# Patient Record
Sex: Male | Born: 1955 | Race: White | Hispanic: No | Smoking: Current every day smoker
Health system: Southern US, Community
[De-identification: ages and names within clinical notes are randomized; demographics above are authoritative.]

## PROBLEM LIST (undated history)

## (undated) DIAGNOSIS — Z8781 Personal history of (healed) traumatic fracture: Secondary | ICD-10-CM

## (undated) DIAGNOSIS — F909 Attention-deficit hyperactivity disorder, unspecified type: Secondary | ICD-10-CM

## (undated) DIAGNOSIS — S21119A Laceration without foreign body of unspecified front wall of thorax without penetration into thoracic cavity, initial encounter: Secondary | ICD-10-CM

## (undated) DIAGNOSIS — I498 Other specified cardiac arrhythmias: Secondary | ICD-10-CM

## (undated) HISTORY — DX: Personal history of (healed) traumatic fracture: Z87.81

## (undated) HISTORY — DX: Other specified cardiac arrhythmias: I49.8

## (undated) HISTORY — PX: OTHER SURGICAL HISTORY: SHX169

## (undated) HISTORY — PX: KNEE SURGERY: SHX244

## (undated) HISTORY — DX: Attention-deficit hyperactivity disorder, unspecified type: F90.9

## (undated) HISTORY — DX: Laceration without foreign body of unspecified front wall of thorax without penetration into thoracic cavity, initial encounter: S21.119A

---

## 2003-03-06 ENCOUNTER — Ambulatory Visit (HOSPITAL_COMMUNITY): Admission: RE | Admit: 2003-03-06 | Discharge: 2003-03-06 | Payer: Self-pay | Admitting: General Surgery

## 2003-03-06 ENCOUNTER — Encounter: Payer: Self-pay | Admitting: General Surgery

## 2005-05-28 ENCOUNTER — Inpatient Hospital Stay (HOSPITAL_COMMUNITY): Admission: AC | Admit: 2005-05-28 | Discharge: 2005-05-29 | Payer: Self-pay

## 2005-06-28 ENCOUNTER — Emergency Department (HOSPITAL_COMMUNITY): Admission: EM | Admit: 2005-06-28 | Discharge: 2005-06-28 | Payer: Self-pay | Admitting: *Deleted

## 2005-07-01 ENCOUNTER — Emergency Department (HOSPITAL_COMMUNITY): Admission: EM | Admit: 2005-07-01 | Discharge: 2005-07-01 | Payer: Self-pay | Admitting: Family Medicine

## 2005-07-06 ENCOUNTER — Emergency Department (HOSPITAL_COMMUNITY): Admission: EM | Admit: 2005-07-06 | Discharge: 2005-07-06 | Payer: Self-pay | Admitting: Family Medicine

## 2011-03-29 ENCOUNTER — Ambulatory Visit (INDEPENDENT_AMBULATORY_CARE_PROVIDER_SITE_OTHER): Payer: Self-pay

## 2011-03-29 ENCOUNTER — Inpatient Hospital Stay (INDEPENDENT_AMBULATORY_CARE_PROVIDER_SITE_OTHER)
Admission: RE | Admit: 2011-03-29 | Discharge: 2011-03-29 | Disposition: A | Discharge status: Home / Self Care | Payer: Self-pay | Source: Ambulatory Visit | Attending: Family Medicine | Admitting: Family Medicine

## 2011-03-29 DIAGNOSIS — J40 Bronchitis, not specified as acute or chronic: Secondary | ICD-10-CM

## 2011-04-06 ENCOUNTER — Telehealth: Payer: Self-pay | Admitting: *Deleted

## 2011-04-06 NOTE — Telephone Encounter (Signed)
Called spoke with patient who informed me that he had just scheduled an appt for 7.20.12 @ 1115.  Pt states that his breathing is doing well presently and not having any problems.  Advised pt that if symptoms return to call the office for a sooner appt.  Pt verbalized his understanding.  Pt to arrive early and bring all meds to the ov.

## 2011-04-06 NOTE — Telephone Encounter (Signed)
Per MW- this pt needs appt here in 2 wks for eval of cough- he was recently in the ED.  I have called and LM with his sister to return my call

## 2011-04-06 NOTE — Telephone Encounter (Signed)
PATIENT RETURNED LESLIE'S CALL.  PLEASE CALL BACK AT [phone].  HE IS IN CLASS AND WILL TRY TO ANSWER.

## 2011-05-06 ENCOUNTER — Ambulatory Visit (INDEPENDENT_AMBULATORY_CARE_PROVIDER_SITE_OTHER): Payer: Self-pay | Admitting: Internal Medicine

## 2011-05-06 ENCOUNTER — Encounter: Payer: Self-pay | Admitting: Internal Medicine

## 2011-05-06 VITALS — BP 142/70 | HR 75 | Temp 97.5°F | Ht 67.0 in | Wt 135.4 lb

## 2011-05-06 DIAGNOSIS — J310 Chronic rhinitis: Secondary | ICD-10-CM | POA: Insufficient documentation

## 2011-05-06 DIAGNOSIS — J449 Chronic obstructive pulmonary disease, unspecified: Secondary | ICD-10-CM

## 2011-05-06 DIAGNOSIS — F17200 Nicotine dependence, unspecified, uncomplicated: Secondary | ICD-10-CM | POA: Insufficient documentation

## 2011-05-06 MED ORDER — PREDNISONE (PAK) 10 MG PO TABS: ORAL_TABLET | ORAL | Status: AC

## 2011-05-06 MED ORDER — AZITHROMYCIN 250 MG PO TABS: ORAL_TABLET | ORAL | Status: AC

## 2011-05-06 MED ORDER — MOMETASONE FURO-FORMOTEROL FUM 200-5 MCG/ACT IN AERO: INHALATION_SPRAY | RESPIRATORY_TRACT | Status: DC

## 2011-05-06 NOTE — Assessment & Plan Note (Addendum)
Relatively mild but actively smoking.   DDX of  difficult airways managment all start with A and  include Adherence, Ace Inhibitors, Acid Reflux, Active Sinus Disease, Alpha 1 Antitripsin deficiency, Anxiety masquerading as Airways dz,  ABPA,  allergy(esp in young), Aspiration (esp in elderly), Adverse effects of DPI,  Active smokers, plus two Bs  = Bronchiectasis and Beta blocker use..and one C= CHF   Active smoking, see separate assessment  Adherence is always the initial "prime suspect" and is a multilayered concern that requires a "trust but verify" approach in every patient - starting with knowing how to use medications, especially inhalers, correctly, keeping up with refills and understanding the fundamental difference between maintenance and prns vs those medications only taken for a very short course and then stopped and not refilled.  The proper method of use, as well as anticipated side effects, of this metered-dose inhaler are discussed and demonstrated to the patient. Improved to 75% with coaching so try dulera 200 2bid  Active sinus dz?  Sinus ct next step

## 2011-05-06 NOTE — Progress Notes (Signed)
Subjective:     Patient ID: Jason Mcguire, male   DOB: 06-03-1956, 55 y.o.   MRN: [id number]  HPI  71 yowm active smoker h/o bronchitis previously under the care of Dr Viviann Spare with prn bronchaid as only longterm med.  05/06/2011 Initial pulmonary office eval since Sept 2011 cough and sob indolent onset / gradually worse s medical attention until 03/29/11 by [facility] rx advair one twice daily > no benefit  p several weeks of taking it regularly cc severe cough esp early am white mucus and when lie , som ebetter with bronchaid.  Doe x walk a mile.  Green mucus from nose x months assoc with nasal obstructive symptoms  Pt denies any significant sore throat, dysphagia, itching, sneezing,  fever, chills, sweats, unintended wt loss, pleuritic or exertional cp, hempoptysis, orthopnea pnd or leg swelling.    Also denies any obvious fluctuation of symptoms with weather or environmental changes or other aggravating or alleviating factors.     Previously seen [facility] on [facility] with dx sinus problems and better with z pak and prednisone   Review of Systems  Constitutional: Positive for activity change. Negative for fever, chills, appetite change and unexpected weight change.  HENT: Positive for congestion. Negative for sore throat, rhinorrhea, sneezing, trouble swallowing, dental problem, voice change and postnasal drip.   Eyes: Positive for visual disturbance.  Respiratory: Positive for cough and shortness of breath. Negative for choking.   Cardiovascular: Negative for chest pain and leg swelling.  Gastrointestinal: Negative for nausea, vomiting and abdominal pain.  Genitourinary: Negative for difficulty urinating.  Musculoskeletal: Negative for arthralgias.  Skin: Negative for rash.  Psychiatric/Behavioral: Negative for behavioral problems and confusion.       Objective:   Physical Exam Wt 135 05/06/2011  Patient failed to answer a single question asked in a straightforward manner, tending to  go off on tangents or answer questions with ambiguous medical terms or diagnoses and seemed aggravated  when asked the same question more than once for clarification.  HEENT mild turbinate edema.  Oropharynx no thrush or excess pnd or cobblestoning.  No JVD or cervical adenopathy. Mild accessory muscle hypertrophy. Trachea midline, nl thryroid. Chest was hyperinflated by percussion with diminished breath sounds and moderate increased exp time without wheeze. Hoover sign positive at mid inspiration. Regular rate and rhythm without murmur gallop or rub or increase P2 or edema.  Abd: no hsm, nl excursion. Ext warm without cyanosis or clubbing.      Assessment:         Plan:

## 2011-05-06 NOTE — Patient Instructions (Signed)
Prednisone 10 mg take  4 each am x 2 days,   2 each am x 2 days,  1 each am x2days and stop   Zpak x one but need ENT referral  Nasal Saline as much as you can irrigate  Dulera 200 mg Take 2 puffs first thing in am and then another 2 puffs about 12 hours later.      I emphasized that although we never turn away smokers from the pulmonary clinic, we do ask that they understand that the recommendations that we make  won't work nearly as well in the presence of continued cigarette exposure.  In fact, we may very well  reach a point where we can't promise to help the patient if he/she can't quit smoking. (We can and will promise to try to help, we just can't promise what we recommend will really work)

## 2011-05-06 NOTE — Assessment & Plan Note (Signed)
See instructions for specific recommendations which were reviewed directly with the patient who was given a copy with highlighter outlining the key components.  

## 2011-05-06 NOTE — Assessment & Plan Note (Signed)
If not improving with conservative rx low threshold to check sinus ct and refer to ent

## 2011-06-23 ENCOUNTER — Emergency Department (HOSPITAL_COMMUNITY)
Admission: EM | Admit: 2011-06-23 | Discharge: 2011-06-23 | Discharge status: Against Medical Advice | Payer: Self-pay | Attending: Emergency Medicine | Admitting: Emergency Medicine

## 2011-06-23 ENCOUNTER — Telehealth: Payer: Self-pay | Admitting: Internal Medicine

## 2011-06-23 DIAGNOSIS — Z0389 Encounter for observation for other suspected diseases and conditions ruled out: Secondary | ICD-10-CM | POA: Insufficient documentation

## 2011-06-23 NOTE — Telephone Encounter (Signed)
Called pt at home number and was advised he is not home. Called cell number and had to [facility].

## 2011-06-23 NOTE — Telephone Encounter (Signed)
Pt was calling to see if MW would see him as his PCP. I told the pt that MW is not taking any new primary care pt's. Pt was calling due to poison ivy and I instructed him to go to urgent care to be evaluated for this.

## 2013-03-19 ENCOUNTER — Emergency Department (HOSPITAL_COMMUNITY)
Admission: EM | Admit: 2013-03-19 | Discharge: 2013-03-19 | Disposition: A | Discharge status: Home / Self Care | Payer: BC Managed Care – PPO | Attending: Emergency Medicine | Admitting: Emergency Medicine

## 2013-03-19 ENCOUNTER — Encounter (HOSPITAL_COMMUNITY): Payer: Self-pay | Admitting: *Deleted

## 2013-03-19 DIAGNOSIS — Z79899 Other long term (current) drug therapy: Secondary | ICD-10-CM | POA: Insufficient documentation

## 2013-03-19 DIAGNOSIS — Z8679 Personal history of other diseases of the circulatory system: Secondary | ICD-10-CM | POA: Insufficient documentation

## 2013-03-19 DIAGNOSIS — F909 Attention-deficit hyperactivity disorder, unspecified type: Secondary | ICD-10-CM | POA: Insufficient documentation

## 2013-03-19 DIAGNOSIS — Z8781 Personal history of (healed) traumatic fracture: Secondary | ICD-10-CM | POA: Insufficient documentation

## 2013-03-19 DIAGNOSIS — Z87828 Personal history of other (healed) physical injury and trauma: Secondary | ICD-10-CM | POA: Insufficient documentation

## 2013-03-19 DIAGNOSIS — J45909 Unspecified asthma, uncomplicated: Secondary | ICD-10-CM | POA: Insufficient documentation

## 2013-03-19 DIAGNOSIS — F17200 Nicotine dependence, unspecified, uncomplicated: Secondary | ICD-10-CM | POA: Insufficient documentation

## 2013-03-19 DIAGNOSIS — K4090 Unilateral inguinal hernia, without obstruction or gangrene, not specified as recurrent: Secondary | ICD-10-CM | POA: Insufficient documentation

## 2013-03-19 DIAGNOSIS — Z880 Allergy status to penicillin: Secondary | ICD-10-CM | POA: Insufficient documentation

## 2013-03-19 MED ORDER — DOCUSATE SODIUM 100 MG PO CAPS: 100.0000 mg | ORAL_CAPSULE | Freq: Two times a day (BID) | ORAL | Status: DC

## 2013-03-19 MED ORDER — OXYCODONE-ACETAMINOPHEN 5-325 MG PO TABS: 1.0000 | ORAL_TABLET | ORAL | Status: DC | PRN

## 2013-03-19 MED ORDER — HYDROMORPHONE HCL PF 2 MG/ML IJ SOLN
2.0000 mg | Freq: Once | INTRAMUSCULAR | Status: AC
  Administered 2013-03-19: 2 mg via INTRAMUSCULAR
  Filled 2013-03-19: qty 1
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
## 2013-03-19 NOTE — ED Notes (Signed)
Pt alert and mentating appropriately upon d/c. NAD noted upon d/c. Pt states decrease in pain. Pt instructed not to drive. Pt states he has a ride coming and will wait for ride in lobby. Pt endorses he will not be driving home. Pt ambulatory upon d/c. Pt leaving with d/c teaching and prescriptions. Pt verbalizes understanding of d/c teaching and prescriptions and has no further questions upon d/c.

## 2013-03-19 NOTE — ED Notes (Signed)
Pt is here right groin hernia hernia that was not able to be reduced and sent here for possible emergent surgery

## 2013-03-22 ENCOUNTER — Encounter (INDEPENDENT_AMBULATORY_CARE_PROVIDER_SITE_OTHER): Payer: Self-pay | Admitting: General Surgery

## 2013-03-22 ENCOUNTER — Ambulatory Visit (INDEPENDENT_AMBULATORY_CARE_PROVIDER_SITE_OTHER): Payer: BC Managed Care – PPO | Admitting: General Surgery

## 2013-03-22 VITALS — BP 124/78 | HR 82 | Temp 98.6°F | Resp 18 | Ht 67.0 in | Wt 133.0 lb

## 2013-03-22 DIAGNOSIS — K4020 Bilateral inguinal hernia, without obstruction or gangrene, not specified as recurrent: Secondary | ICD-10-CM
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
## 2013-03-26 ENCOUNTER — Telehealth (INDEPENDENT_AMBULATORY_CARE_PROVIDER_SITE_OTHER): Payer: Self-pay | Admitting: General Surgery

## 2013-03-26 ENCOUNTER — Other Ambulatory Visit (INDEPENDENT_AMBULATORY_CARE_PROVIDER_SITE_OTHER): Payer: Self-pay | Admitting: General Surgery

## 2013-03-26 MED ORDER — OXYCODONE-ACETAMINOPHEN 5-325 MG PO TABS: 1.0000 | ORAL_TABLET | Freq: Four times a day (QID) | ORAL | Status: DC | PRN

## 2013-03-26 NOTE — Telephone Encounter (Signed)
Patient walked in today wanting another Rx for percocet...Dr.Streck wrote Rx for percocet 5/375 #30 no refills and Rx was giving to patient.Marland KitchenMarland Kitchenpt was made aware that we could not keep giving pain med that he would need to schd surgery to have the problem fixed...patient understood and stated it was a matter of working out money issues along with school schd and having someone to help him after surgery as well...he stated that his sister was coming into town this Thursday and that he should know something more around that time.Marland KitchenMarland Kitchen

## 2013-04-23 ENCOUNTER — Other Ambulatory Visit (INDEPENDENT_AMBULATORY_CARE_PROVIDER_SITE_OTHER): Payer: Self-pay | Admitting: General Surgery

## 2013-04-23 MED ORDER — OXYCODONE-ACETAMINOPHEN 5-325 MG PO TABS: 1.0000 | ORAL_TABLET | Freq: Four times a day (QID) | ORAL | Status: DC | PRN

## 2013-04-30 ENCOUNTER — Telehealth (INDEPENDENT_AMBULATORY_CARE_PROVIDER_SITE_OTHER): Payer: Self-pay | Admitting: *Deleted

## 2013-04-30 NOTE — Telephone Encounter (Signed)
Patient called to ask for more pain medicine to get him through until his surgery next Friday 05/10/13.  Spoke to [employer] MD who wrote prescription for Percocet 5/325mg  1-2 every 4 hours as needed for pain #20 no refills.  Patient states understanding that prescription is at the front desk for him to pick up.  Patient stated he doesn't think #20 will get him to next Friday but he will try it.

## 2013-05-10 DIAGNOSIS — K4020 Bilateral inguinal hernia, without obstruction or gangrene, not specified as recurrent: Secondary | ICD-10-CM

## 2013-05-13 ENCOUNTER — Telehealth (INDEPENDENT_AMBULATORY_CARE_PROVIDER_SITE_OTHER): Payer: Self-pay | Admitting: General Surgery

## 2013-05-13 MED ORDER — OXYCODONE-ACETAMINOPHEN 10-325 MG PO TABS: 1.0000 | ORAL_TABLET | Freq: Four times a day (QID) | ORAL | Status: DC | PRN

## 2013-05-13 NOTE — Telephone Encounter (Signed)
Patient calling in status post bilateral inguinal hernia repair on 05/10/2013 asking for a refill of oxycodone 5/325 or stronger. I advised I could call him in [place] per protocol but he states the oxycodone is barely controlling his pain and he is taking two every four hours. Please advise. [phone].

## 2013-05-13 NOTE — Telephone Encounter (Signed)
RX for percocet 10/325 q 6hr PRN pain #20 no refills to front desk and patient notified

## 2013-05-13 NOTE — Addendum Note (Signed)
Addended by: June Leap on: 05/13/2013 12:21 PM   Modules accepted: Orders

## 2013-05-17 ENCOUNTER — Other Ambulatory Visit (INDEPENDENT_AMBULATORY_CARE_PROVIDER_SITE_OTHER): Payer: Self-pay

## 2013-05-17 ENCOUNTER — Telehealth (INDEPENDENT_AMBULATORY_CARE_PROVIDER_SITE_OTHER): Payer: Self-pay

## 2013-05-17 DIAGNOSIS — G8918 Other acute postprocedural pain: Secondary | ICD-10-CM

## 2013-05-17 MED ORDER — OXYCODONE-ACETAMINOPHEN 10-325 MG PO TABS: 1.0000 | ORAL_TABLET | Freq: Four times a day (QID) | ORAL | Status: AC | PRN

## 2013-05-17 NOTE — Telephone Encounter (Signed)
The pt called requesting more Oxycodone 10/325 s/p bilateral inguinal hernia repair.   He rates their pain a 7 out of 10.  He has no fever and is eating ok.  His bowels are moving.  Dr Derrell Lolling is not available and I will have to ask another md that is in the office.

## 2013-05-17 NOTE — Telephone Encounter (Signed)
Dr Johna Sheriff approved the rx and I called the pt to let him know he can pick it up at the front desk.

## 2013-05-24 ENCOUNTER — Encounter (INDEPENDENT_AMBULATORY_CARE_PROVIDER_SITE_OTHER): Payer: Self-pay | Admitting: General Surgery

## 2013-05-24 ENCOUNTER — Ambulatory Visit (INDEPENDENT_AMBULATORY_CARE_PROVIDER_SITE_OTHER): Payer: BC Managed Care – PPO | Admitting: General Surgery

## 2013-05-24 VITALS — BP 144/64 | HR 78 | Resp 18 | Ht 67.0 in | Wt 134.0 lb

## 2013-05-24 DIAGNOSIS — Z9889 Other specified postprocedural states: Secondary | ICD-10-CM

## 2013-05-24 DIAGNOSIS — Z8719 Personal history of other diseases of the digestive system: Secondary | ICD-10-CM

## 2013-05-24 NOTE — Progress Notes (Signed)
Patient ID: Jason Mcguire, male   DOB: February 01, 1956, 57 y.o.   MRN: [id number] A 57 year old male status post bilateral laparoscopic inguinal hernia repair. The patient had been doing well postoperatively until approximately 4-5 days after the operation. He stated that time he was lying down and twisted and was started on and felt pain in his right inguinal area. He states since that time he noticed a bulging sensation reach down in the scrotum. He had some pain at that time, and has subsequently continued.  On exam: His wounds are clean dry and intact His right inguinal fullness, difficult to appreciate a recurrent hernia Left inguinal hernia repair is intact  Assessment and plan: 57 year old male status post laparoscopic bilateral inguinal herniaRepair withMesh 1. The patient did he have a recurrent hernia secondary to twisting versus a possible seroma. I discussed with the patient the need to wear either a tightfitting undergarment to help with the possible seroma. Also recommend placing ice at nighttime. 2. We'll have patient follow back up with 3 weeks to reexamine the area. Should there be similar in size we'll have the patient undergo CT scan for possible evaluation of recurrent hernia.

## 2013-05-31 ENCOUNTER — Other Ambulatory Visit (INDEPENDENT_AMBULATORY_CARE_PROVIDER_SITE_OTHER): Payer: Self-pay | Admitting: General Surgery

## 2013-05-31 ENCOUNTER — Telehealth (INDEPENDENT_AMBULATORY_CARE_PROVIDER_SITE_OTHER): Payer: Self-pay | Admitting: General Surgery

## 2013-05-31 MED ORDER — HYDROCODONE-ACETAMINOPHEN 10-325 MG PO TABS: 1.0000 | ORAL_TABLET | Freq: Four times a day (QID) | ORAL | Status: AC | PRN

## 2013-05-31 NOTE — Telephone Encounter (Signed)
Pt called for pain med refill.  Per protocol, the one automatic pain med refill was called in .  Hydrocodone 5/325 mg, # 30, 1 po Q4-6H prn pain, no refill called to [facility]:  [phone].Marland Kitchen

## 2013-06-20 ENCOUNTER — Encounter (INDEPENDENT_AMBULATORY_CARE_PROVIDER_SITE_OTHER): Payer: Self-pay | Admitting: General Surgery

## 2013-06-20 ENCOUNTER — Ambulatory Visit (INDEPENDENT_AMBULATORY_CARE_PROVIDER_SITE_OTHER): Payer: BC Managed Care – PPO | Admitting: General Surgery

## 2013-06-20 VITALS — BP 140/90 | HR 80 | Temp 98.0°F | Resp 18 | Ht 67.0 in | Wt 135.0 lb

## 2013-06-20 DIAGNOSIS — R1031 Right lower quadrant pain: Secondary | ICD-10-CM

## 2013-06-20 NOTE — Progress Notes (Signed)
Patient ID: Jason Mcguire, male   DOB: Dec 27, 1955, 57 y.o.   MRN: [id number] The patient is a 57 year old male status post bilateral inguinal hernia repair with mesh. The patient was seen approximately 2 weeks ago first first postop visit. That time he had a right inguinal fullness that was thought to be a seroma/hematoma versus a possible recurrence.  The patient states that the areaof fullness has gonedown. He has been wearing snugger fitting undergarments. He is to see uncomfortableability from that side has gotten better.   On exam: Would recommend are intact left side with no palpable hernia on Valsalva. Right side still with a fullness to the right inguinal area. However it is smaller than previous visit there's no overt hernia felt on palpation with Valsalva  Assessment and plan: 1. 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 2. We'll obtain a CT scan of his abdomen and pelvis without contrast to evaluate for possible right inguinal hernia versus seroma/hematoma. 3. I will call the patient with his results.

## 2013-06-25 ENCOUNTER — Ambulatory Visit
Admission: RE | Admit: 2013-06-25 | Discharge: 2013-06-25 | Disposition: A | Discharge status: Home / Self Care | Payer: BC Managed Care – PPO | Source: Ambulatory Visit | Attending: General Surgery | Admitting: General Surgery

## 2013-06-25 DIAGNOSIS — R1031 Right lower quadrant pain: Secondary | ICD-10-CM

## 2013-06-28 ENCOUNTER — Telehealth (INDEPENDENT_AMBULATORY_CARE_PROVIDER_SITE_OTHER): Payer: Self-pay | Admitting: General Surgery

## 2013-06-28 NOTE — Telephone Encounter (Signed)
I discussed with the patient that there is fluid within the L inguinal canal as well as scrotum.  There might be a minimal hernia.  Hes voiced concern and asked if any of the staples had pulled out.  I told him its hard to tell whether or not the staples had moved much. I assured him that the fluid would go away with time, and he understood.

## 2013-08-22 ENCOUNTER — Other Ambulatory Visit: Payer: Self-pay

## 2019-01-25 ENCOUNTER — Emergency Department (HOSPITAL_COMMUNITY): Payer: BLUE CROSS/BLUE SHIELD | Admitting: Anesthesiology

## 2019-01-25 ENCOUNTER — Inpatient Hospital Stay: Admit: 2019-01-25 | Payer: BLUE CROSS/BLUE SHIELD | Admitting: Orthopaedic Surgery

## 2019-01-25 ENCOUNTER — Emergency Department (HOSPITAL_COMMUNITY): Payer: BLUE CROSS/BLUE SHIELD

## 2019-01-25 ENCOUNTER — Emergency Department (HOSPITAL_COMMUNITY)
Admission: EM | Admit: 2019-01-25 | Discharge: 2019-01-25 | Disposition: A | Discharge status: Home / Self Care | Payer: BLUE CROSS/BLUE SHIELD | Attending: Emergency Medicine | Admitting: Emergency Medicine

## 2019-01-25 ENCOUNTER — Encounter (HOSPITAL_COMMUNITY)
Admission: EM | Disposition: A | Discharge status: Home / Self Care | Payer: Self-pay | Source: Home / Self Care | Attending: Emergency Medicine

## 2019-01-25 ENCOUNTER — Other Ambulatory Visit: Payer: Self-pay

## 2019-01-25 ENCOUNTER — Encounter (HOSPITAL_COMMUNITY): Payer: Self-pay

## 2019-01-25 DIAGNOSIS — W3189XA Contact with other specified machinery, initial encounter: Secondary | ICD-10-CM | POA: Diagnosis not present

## 2019-01-25 DIAGNOSIS — F17210 Nicotine dependence, cigarettes, uncomplicated: Secondary | ICD-10-CM | POA: Insufficient documentation

## 2019-01-25 DIAGNOSIS — Z91041 Radiographic dye allergy status: Secondary | ICD-10-CM | POA: Diagnosis not present

## 2019-01-25 DIAGNOSIS — Z23 Encounter for immunization: Secondary | ICD-10-CM | POA: Insufficient documentation

## 2019-01-25 DIAGNOSIS — S61313A Laceration without foreign body of left middle finger with damage to nail, initial encounter: Secondary | ICD-10-CM | POA: Diagnosis present

## 2019-01-25 DIAGNOSIS — S61311A Laceration without foreign body of left index finger with damage to nail, initial encounter: Secondary | ICD-10-CM

## 2019-01-25 DIAGNOSIS — W312XXA Contact with powered woodworking and forming machines, initial encounter: Secondary | ICD-10-CM

## 2019-01-25 DIAGNOSIS — J449 Chronic obstructive pulmonary disease, unspecified: Secondary | ICD-10-CM | POA: Insufficient documentation

## 2019-01-25 DIAGNOSIS — S62631B Displaced fracture of distal phalanx of left index finger, initial encounter for open fracture: Secondary | ICD-10-CM | POA: Insufficient documentation

## 2019-01-25 DIAGNOSIS — M19042 Primary osteoarthritis, left hand: Secondary | ICD-10-CM | POA: Insufficient documentation

## 2019-01-25 DIAGNOSIS — S68123A Partial traumatic metacarpophalangeal amputation of left middle finger, initial encounter: Secondary | ICD-10-CM | POA: Diagnosis not present

## 2019-01-25 DIAGNOSIS — F988 Other specified behavioral and emotional disorders with onset usually occurring in childhood and adolescence: Secondary | ICD-10-CM | POA: Insufficient documentation

## 2019-01-25 DIAGNOSIS — Z880 Allergy status to penicillin: Secondary | ICD-10-CM | POA: Diagnosis not present

## 2019-01-25 DIAGNOSIS — Z8249 Family history of ischemic heart disease and other diseases of the circulatory system: Secondary | ICD-10-CM | POA: Diagnosis not present

## 2019-01-25 DIAGNOSIS — Z79899 Other long term (current) drug therapy: Secondary | ICD-10-CM | POA: Diagnosis not present

## 2019-01-25 HISTORY — PX: LACERATION REPAIR: SHX5284

## 2019-01-25 SURGERY — REPAIR, LACERATION, 2 OR MORE
Anesthesia: General | Laterality: Left

## 2019-01-25 MED ORDER — ROCURONIUM BROMIDE 10 MG/ML (PF) SYRINGE
PREFILLED_SYRINGE | INTRAVENOUS | Status: DC | PRN
  Administered 2019-01-25: 30 mg via INTRAVENOUS

## 2019-01-25 MED ORDER — CLINDAMYCIN HCL 150 MG PO CAPS: 150.0000 mg | ORAL_CAPSULE | Freq: Four times a day (QID) | ORAL | 0 refills | Status: AC

## 2019-01-25 MED ORDER — 0.9 % SODIUM CHLORIDE (POUR BTL) OPTIME
TOPICAL | Status: DC | PRN
  Administered 2019-01-25: 21:00:00 1000 mL

## 2019-01-25 MED ORDER — MIDAZOLAM HCL 2 MG/2ML IJ SOLN
INTRAMUSCULAR | Status: DC | PRN
  Administered 2019-01-25: 2 mg via INTRAVENOUS

## 2019-01-25 MED ORDER — PROPOFOL 10 MG/ML IV BOLUS
INTRAVENOUS | Status: DC | PRN
  Administered 2019-01-25: 120 mg via INTRAVENOUS

## 2019-01-25 MED ORDER — FENTANYL CITRATE (PF) 250 MCG/5ML IJ SOLN
INTRAMUSCULAR | Status: DC | PRN
  Administered 2019-01-25 (×3): 50 ug via INTRAVENOUS

## 2019-01-25 MED ORDER — ONDANSETRON HCL 4 MG/2ML IJ SOLN
INTRAMUSCULAR | Status: DC | PRN
  Administered 2019-01-25: 4 mg via INTRAVENOUS

## 2019-01-25 MED ORDER — MIDAZOLAM HCL 2 MG/2ML IJ SOLN
INTRAMUSCULAR | Status: AC
  Filled 2019-01-25: qty 2

## 2019-01-25 MED ORDER — LIDOCAINE 2% (20 MG/ML) 5 ML SYRINGE
INTRAMUSCULAR | Status: DC | PRN
  Administered 2019-01-25: 60 mg via INTRAVENOUS

## 2019-01-25 MED ORDER — DEXAMETHASONE SODIUM PHOSPHATE 10 MG/ML IJ SOLN
INTRAMUSCULAR | Status: AC
  Filled 2019-01-25: qty 1

## 2019-01-25 MED ORDER — BUPIVACAINE HCL (PF) 0.25 % IJ SOLN
INTRAMUSCULAR | Status: DC | PRN
  Administered 2019-01-25: 20 mL

## 2019-01-25 MED ORDER — SUCCINYLCHOLINE CHLORIDE 200 MG/10ML IV SOSY
PREFILLED_SYRINGE | INTRAVENOUS | Status: DC | PRN
  Administered 2019-01-25: 80 mg via INTRAVENOUS

## 2019-01-25 MED ORDER — ONDANSETRON HCL 4 MG/2ML IJ SOLN
4.0000 mg | Freq: Once | INTRAMUSCULAR | Status: AC
  Administered 2019-01-25: 4 mg via INTRAVENOUS
  Filled 2019-01-25: qty 2

## 2019-01-25 MED ORDER — HYDROCODONE-ACETAMINOPHEN 5-325 MG PO TABS: 1.0000 | ORAL_TABLET | ORAL | 0 refills | Status: DC | PRN

## 2019-01-25 MED ORDER — SUGAMMADEX SODIUM 200 MG/2ML IV SOLN
INTRAVENOUS | Status: DC | PRN
  Administered 2019-01-25: 130 mg via INTRAVENOUS

## 2019-01-25 MED ORDER — FENTANYL CITRATE (PF) 100 MCG/2ML IJ SOLN: 25.0000 ug | INTRAMUSCULAR | Status: DC | PRN

## 2019-01-25 MED ORDER — EPHEDRINE SULFATE-NACL 50-0.9 MG/10ML-% IV SOSY
PREFILLED_SYRINGE | INTRAVENOUS | Status: DC | PRN
  Administered 2019-01-25: 10 mg via INTRAVENOUS

## 2019-01-25 MED ORDER — ONDANSETRON HCL 4 MG/2ML IJ SOLN
INTRAMUSCULAR | Status: AC
  Filled 2019-01-25: qty 2

## 2019-01-25 MED ORDER — LIDOCAINE 2% (20 MG/ML) 5 ML SYRINGE
INTRAMUSCULAR | Status: AC
  Filled 2019-01-25: qty 5

## 2019-01-25 MED ORDER — EPHEDRINE 5 MG/ML INJ
INTRAVENOUS | Status: AC
  Filled 2019-01-25: qty 10

## 2019-01-25 MED ORDER — CLINDAMYCIN PHOSPHATE 600 MG/50ML IV SOLN
600.0000 mg | Freq: Once | INTRAVENOUS | Status: AC
  Administered 2019-01-25: 600 mg via INTRAVENOUS
  Filled 2019-01-25: qty 50

## 2019-01-25 MED ORDER — PROPOFOL 10 MG/ML IV BOLUS
INTRAVENOUS | Status: AC
  Filled 2019-01-25: qty 20

## 2019-01-25 MED ORDER — LIDOCAINE HCL 2 % IJ SOLN
10.0000 mL | Freq: Once | INTRAMUSCULAR | Status: DC
  Filled 2019-01-25: qty 20

## 2019-01-25 MED ORDER — FENTANYL CITRATE (PF) 250 MCG/5ML IJ SOLN
INTRAMUSCULAR | Status: AC
  Filled 2019-01-25: qty 5

## 2019-01-25 MED ORDER — LACTATED RINGERS IV SOLN
INTRAVENOUS | Status: DC | PRN
  Administered 2019-01-25 (×2): via INTRAVENOUS

## 2019-01-25 MED ORDER — BUPIVACAINE HCL (PF) 0.25 % IJ SOLN
INTRAMUSCULAR | Status: AC
  Filled 2019-01-25: qty 30

## 2019-01-25 MED ORDER — SUCCINYLCHOLINE CHLORIDE 200 MG/10ML IV SOSY
PREFILLED_SYRINGE | INTRAVENOUS | Status: AC
  Filled 2019-01-25: qty 10

## 2019-01-25 MED ORDER — TETANUS-DIPHTH-ACELL PERTUSSIS 5-2.5-18.5 LF-MCG/0.5 IM SUSP
0.5000 mL | Freq: Once | INTRAMUSCULAR | Status: AC
  Administered 2019-01-25: 0.5 mL via INTRAMUSCULAR
  Filled 2019-01-25: qty 0.5

## 2019-01-25 MED ORDER — ROCURONIUM BROMIDE 10 MG/ML (PF) SYRINGE
PREFILLED_SYRINGE | INTRAVENOUS | Status: AC
  Filled 2019-01-25: qty 10

## 2019-01-25 MED ORDER — SUGAMMADEX SODIUM 200 MG/2ML IV SOLN
INTRAVENOUS | Status: AC
  Filled 2019-01-25: qty 2

## 2019-01-25 MED ORDER — DEXAMETHASONE SODIUM PHOSPHATE 10 MG/ML IJ SOLN
INTRAMUSCULAR | Status: DC | PRN
  Administered 2019-01-25: 10 mg via INTRAVENOUS

## 2019-01-25 MED ORDER — MORPHINE SULFATE (PF) 4 MG/ML IV SOLN
4.0000 mg | Freq: Once | INTRAVENOUS | Status: AC
  Administered 2019-01-25: 19:00:00 4 mg via INTRAVENOUS
  Filled 2019-01-25: qty 1

## 2019-01-25 SURGICAL SUPPLY — 33 items
BACTOSHIELD CHG 4% 4OZ (MISCELLANEOUS) ×1
BNDG COHESIVE 3X5 TAN STRL LF (GAUZE/BANDAGES/DRESSINGS) ×2 IMPLANT
BNDG CONFORM 3 STRL LF (GAUZE/BANDAGES/DRESSINGS) ×4 IMPLANT
CORD BIPOLAR FORCEPS 12FT (ELECTRODE) ×2 IMPLANT
COVER MAYO STAND STRL (DRAPES) ×2 IMPLANT
COVER SURGICAL LIGHT HANDLE (MISCELLANEOUS) ×2 IMPLANT
CUFF TOURN SGL QUICK 18X4 (TOURNIQUET CUFF) ×2 IMPLANT
DRAPE OEC MINIVIEW 54X84 (DRAPES) ×2 IMPLANT
DRAPE SURG 17X11 SM STRL (DRAPES) ×2 IMPLANT
DRSG EMULSION OIL 3X3 NADH (GAUZE/BANDAGES/DRESSINGS) ×2 IMPLANT
ELECT REM PT RETURN 15FT ADLT (MISCELLANEOUS) ×2 IMPLANT
GAUZE SPONGE 4X4 12PLY STRL (GAUZE/BANDAGES/DRESSINGS) ×2 IMPLANT
GAUZE XEROFORM 1X8 LF (GAUZE/BANDAGES/DRESSINGS) ×2 IMPLANT
GLOVE BIOGEL M 8.0 STRL (GLOVE) ×2 IMPLANT
GLOVE BIOGEL PI IND STRL 6.5 (GLOVE) IMPLANT
GLOVE BIOGEL PI INDICATOR 6.5 (GLOVE) ×2
IV NS IRRIG 3000ML ARTHROMATIC (IV SOLUTION) IMPLANT
K-WIRE CAPS BLUE STERILE .035 (WIRE) ×3
KIT BASIN OR (CUSTOM PROCEDURE TRAY) ×2 IMPLANT
KIT TURNOVER KIT A (KITS) ×2 IMPLANT
KWIRE CAPS BLUE STRL .035 (WIRE) IMPLANT
NEEDLE HYPO 22GX1.5 SAFETY (NEEDLE) ×2 IMPLANT
PACK ORTHO EXTREMITY (CUSTOM PROCEDURE TRAY) ×2 IMPLANT
SCRUB CHG 4% DYNA-HEX 4OZ (MISCELLANEOUS) ×1 IMPLANT
SCRUB TECHNI CARE 4 OZ NO DYE (MISCELLANEOUS) ×2 IMPLANT
SPLINT PLASTER EXTRA FAST 3X15 (CAST SUPPLIES) ×2
SPLINT PLASTER GYPS XFAST 3X15 (CAST SUPPLIES) IMPLANT
STOCKINETTE 6  STRL (DRAPES) ×2
STOCKINETTE 6 STRL (DRAPES) IMPLANT
SUT PLAIN 5 0 P 3 18 (SUTURE) ×2 IMPLANT
SUT PROLENE 4 0 PS 2 18 (SUTURE) ×4 IMPLANT
SYR CONTROL 10ML LL (SYRINGE) ×2 IMPLANT
TOWEL OR 17X26 10 PK STRL BLUE (TOWEL DISPOSABLE) ×2 IMPLANT

## 2019-01-25 NOTE — Op Note (Signed)
PREOPERATIVE DIAGNOSIS: Left hand saw injury with near complete amputation of left middle finger and open fracture with nailbed laceration to the index finger  POSTOPERATIVE DIAGNOSIS: Same  ATTENDING PHYSICIAN: Maudry Mayhew. Jeannie Fend, III, MD who was present and scrubbed for the entire case   ASSISTANT SURGEON: None.   ANESTHESIA: General with digital block  SURGICAL PROCEDURES:  1. Irrigation and debridement of skin, subcutaneous tissue and bone to both the index finger and middle fingers 2.  Revision amputation left middle finger 3.  Percutaneous pin fixation of left index finger distal phalanx fracture 4.  Nailbed repair left index finger  SURGICAL INDICATIONS: Patient is a 63 year old male who earlier today hit his index and middle fingers on a saw.  He was seen in the ER and found to have severe injuries to both index and middle fingers consistent with saw injury.  There was a large near complete amputation of the middle finger and a fracture and nailbed laceration to the index finger.  I discussed with him treatment options and he did agree to proceed with surgical intervention.  FINDING: There is near complete amputation of the middle finger with duskiness of the tip prior to tourniquet inflation.  There is stable and tension-free revision amputation following shortening of the digit.  There was a flap of tissue along the ulnar aspect of the index finger with a nailbed laceration and distal phalanx fracture.  This was repaired and pinned in place  DESCRIPTION OF PROCEDURE: Patient was identified in the preoperative holding area where the risk benefits and alternatives of the procedure were discussed with the patient.  These include but are not limited to infection, bleeding, damage to surrounding structures including blood vessels and nerves, pain, stiffness and need for additional procedures.  Informed consent was obtained at that time patient's left middle and index fingers were marked with  surgical marking pen.  He is then brought back to operative suite where a timeout was performed identifying the correct patient and operative site.  He was positioned supine operative table with his hand outstretched on a hand table.  He was induced under general anesthesia.  Tourniquet is placed on the upper arm the arm was then prepped and draped in usual sterile fashion.  The limb was exsanguinated and tourniquet was inflated.  Attention was first directed to the middle finger there was near complete amputation of the tip of the finger through the distal phalanx.  The distal pulp of the finger was somewhat dusky prior to tourniquet inflation.  Patient also endorsed no sensation to the finger preoperatively so the decision was made to proceed forward with revision amputation of the finger.  The remaining portion of the distal phalanx at the DIP joint was removed performing a DIP disarticulation.  The wound was then copiously irrigated with normal saline and skin, subcutaneous tissue and bone were debrided using a curette and rondure.  A small portion of the middle phalanx was then removed to allow for a tension-free closure.  This was done using a bone biter and rondure.  The distal portion of the digit was removed and skin flaps were mobilized both volarly and dorsally.  Skin was closed with interrupted 4-0 Prolene sutures.  This was done in a tension-free manner and dogears were removed to ensure appropriate contour.  Then turned our attention to the index finger.  There is a small tuft fracture as well as a ulnar skin flap including the nailbed laceration.  This wound was irrigated with  copious amounts normal saline and the skin, subcutaneous tissue and bone were debrided both sharply with a 15 blade as well as a curette and rondure.  An .50 K wire was placed through the distal tuft fracture across the intact distal phalanx and DIP joint.  Multiple fluoroscopic images were obtained showing appropriate  positioning of the K wire.  The skin edges along the pulp of the finger were approximated and closed with interrupted 4-0 Prolene's.  The nailbed was reapproximated and closed with interrupted 5-0 plain gut sutures.  Adaptic was placed on the now repaired nail bed.  The K wire was cut and a pin cap was placed.  Xeroform was placed on the wounds to both fingers.  4 x 4's, Kling and small finger splints were then placed followed by the application of Covan.  The tourniquet was released and the patient had return of brisk cap refill to the intact digits.  He was awoken from his anesthesia, extubated in the operating room and taken to the PACU in stable condition.  He tolerated procedure well and there were no complications.  RADIOGRAPHIC INTERPRETATION: 2 views of the left index finger including PA and lateral images were obtained intraoperative.  These show restoration of the tuft fracture and a K wire crossing the DIP joint.  There are no other fractures or dislocations.  ESTIMATED BLOOD LOSS: Less than 10 mL's  TOURNIQUET TIME: 56 minutes  SPECIMENS: None  POSTOPERATIVE PLAN: The patient will be discharged home and seen back  in the office in approximately 10-12 days for wound check, suture  removal, and then be sent to a therapist for wound care and early range of motion of the intact joints.  IMPLANTS: 0.035 K wire x1

## 2019-01-25 NOTE — ED Notes (Signed)
Lidocaine at bedside.

## 2019-01-25 NOTE — ED Notes (Signed)
Pt's belongings are in locker 33 in [place]. 3 patient belonging bags with pt stickers on front and back.

## 2019-01-25 NOTE — ED Notes (Addendum)
Patient changed into gown and given CHG bath. Per OR RN, ring on left hand will be removed in OR.
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
## 2019-01-25 NOTE — Anesthesia Procedure Notes (Signed)
Procedure Name: Intubation Date/Time: 01/25/2019 9:02 PM Performed by: Sharlette Dense, CRNA Patient Re-evaluated:Patient Re-evaluated prior to induction Oxygen Delivery Method: Circle system utilized Preoxygenation: Pre-oxygenation with 100% oxygen Induction Type: IV induction and Rapid sequence Laryngoscope Size: Miller and 3 Tube type: Oral Tube size: 8.0 mm Number of attempts: 1 Airway Equipment and Method: Stylet Placement Confirmation: ETT inserted through vocal cords under direct vision,  positive ETCO2 and breath sounds checked- equal and bilateral Secured at: 22 cm Tube secured with: Tape Dental Injury: Teeth and Oropharynx as per pre-operative assessment

## 2019-01-25 NOTE — Discharge Instructions (Signed)
Discharge Instructions  - Keep dressings in place. Do not remove them. - The dressings must stay dry - Take all medication as prescribed. Transition to over the counter pain medication as your pain improves - Keep the hand elevated over the next 48-72 hours to help with pain and swelling - Move all digits not restricted by the dressings regularly to prevent stiffness - Please call to schedule a follow up appointment with Dr. Jeannie Fend at [phone] for 10-14 days following surgery

## 2019-01-25 NOTE — Anesthesia Postprocedure Evaluation (Signed)
Anesthesia Post Note  Patient: CADDEN ELIZONDO  Procedure(s) Performed: LEFT MIDDLE REVISION AMPUTATION, LEFT INDEX FINGER IRRIGATION AND DEBRIDEMENT AND NAIL BED REPAIR (Left )     Patient location during evaluation: PACU Anesthesia Type: General Level of consciousness: awake and alert Pain management: pain level controlled Vital Signs Assessment: post-procedure vital signs reviewed and stable Respiratory status: spontaneous breathing, nonlabored ventilation and respiratory function stable Cardiovascular status: blood pressure returned to baseline and stable Postop Assessment: no apparent nausea or vomiting Anesthetic complications: no    Last Vitals:  Vitals:   01/25/19 2235 01/25/19 2245  BP: (!) 163/73 (!) 148/63  Pulse: 67 61  Resp: (!) 6 12  Temp: 36.9 C   SpO2: 100% 100%    Last Pain:  Vitals:   01/25/19 2300  TempSrc:   PainSc: 0-No pain                 Noah Lembke,W. EDMOND

## 2019-01-25 NOTE — Anesthesia Preprocedure Evaluation (Addendum)
Anesthesia Evaluation  Patient identified by MRN, date of birth, ID band Patient awake    Reviewed: Allergy & Precautions, H&P , NPO status , Patient's Chart, lab work & pertinent test results  Airway Mallampati: II  TM Distance: >3 FB Neck ROM: Full    Dental no notable dental hx. (+) Teeth Intact, Dental Advisory Given   Pulmonary asthma , Current Smoker,    Pulmonary exam normal breath sounds clear to auscultation       Cardiovascular negative cardio ROS   Rhythm:Regular Rate:Normal     Neuro/Psych negative neurological ROS  negative psych ROS   GI/Hepatic negative GI ROS, Neg liver ROS,   Endo/Other  negative endocrine ROS  Renal/GU negative Renal ROS  negative genitourinary   Musculoskeletal   Abdominal   Peds  Hematology negative hematology ROS (+)   Anesthesia Other Findings   Reproductive/Obstetrics negative OB ROS                            Anesthesia Physical Anesthesia Plan  ASA: II and emergent  Anesthesia Plan: General   Post-op Pain Management:    Induction: Intravenous, Rapid sequence and Cricoid pressure planned  PONV Risk Score and Plan: 2 and Ondansetron, Dexamethasone and Midazolam  Airway Management Planned: Oral ETT  Additional Equipment:   Intra-op Plan:   Post-operative Plan: Extubation in OR  Informed Consent: I have reviewed the patients History and Physical, chart, labs and discussed the procedure including the risks, benefits and alternatives for the proposed anesthesia with the patient or authorized representative who has indicated his/her understanding and acceptance.     Dental advisory given  Plan Discussed with: CRNA  Anesthesia Plan Comments:         Anesthesia Quick Evaluation

## 2019-01-25 NOTE — Transfer of Care (Signed)
Immediate Anesthesia Transfer of Care Note  Patient: Jason Mcguire  Procedure(s) Performed: LEFT MIDDLE REVISION AMPUTATION, LEFT INDEX FINGER IRRIGATION AND DEBRIDEMENT AND NAIL BED REPAIR (Left )  Patient Location: PACU  Anesthesia Type:General  Level of Consciousness: awake  Airway & Oxygen Therapy: Patient Spontanous Breathing and Patient connected to face mask oxygen  Post-op Assessment: Report given to RN and Post -op Vital signs reviewed and stable  Post vital signs: Reviewed and stable  Last Vitals:  Vitals Value Taken Time  BP 163/73 01/25/2019 10:35 PM  Temp    Pulse 64 01/25/2019 10:36 PM  Resp 11 01/25/2019 10:36 PM  SpO2 100 % 01/25/2019 10:36 PM  Vitals shown include unvalidated device data.  Last Pain:  Vitals:   01/25/19 1933  TempSrc:   PainSc: 5          Complications: No apparent anesthesia complications

## 2019-01-25 NOTE — ED Notes (Signed)
XRAY at Bedside.  

## 2019-01-25 NOTE — ED Triage Notes (Signed)
Patient states he cut his left pointer and middle finger with a skill saw. Bleeding controlled. Patient has fingers wrapped.Patient states he cut to the bone.
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
## 2019-01-26 ENCOUNTER — Other Ambulatory Visit: Payer: Self-pay

## 2019-01-26 ENCOUNTER — Encounter (HOSPITAL_COMMUNITY): Payer: Self-pay

## 2019-01-26 ENCOUNTER — Emergency Department (HOSPITAL_COMMUNITY)
Admission: EM | Admit: 2019-01-26 | Discharge: 2019-01-26 | Disposition: A | Discharge status: Home / Self Care | Payer: BLUE CROSS/BLUE SHIELD | Attending: Emergency Medicine | Admitting: Emergency Medicine

## 2019-01-26 DIAGNOSIS — Z4801 Encounter for change or removal of surgical wound dressing: Secondary | ICD-10-CM | POA: Insufficient documentation

## 2019-01-26 NOTE — ED Notes (Signed)
Post operative dressing fell off; patient left middle finger and left index finger bleeding slightly so wound cleansed prior to dressing applied.
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
## 2019-01-26 NOTE — ED Triage Notes (Signed)
Pt returns because his dressing fell off.

## 2019-01-26 NOTE — ED Notes (Signed)
Adaptic to left index nail bed intact and wounds covered in xeroform and kling dressing to left index and middle fingers-sutures intact to left middle finger amputation and pin present end of left index finger-splints to top of fingers reapplied and held in place with kling and all areas rewrapped with coban dressing.

## 2019-01-26 NOTE — Discharge Instructions (Signed)
Try to keep dressing clean and dry. Follow-up with surgeon as scheduled. Return here for any new/acute changes.

## 2019-01-28 ENCOUNTER — Encounter (HOSPITAL_COMMUNITY): Payer: Self-pay | Admitting: Orthopaedic Surgery

## 2021-09-01 DIAGNOSIS — F988 Other specified behavioral and emotional disorders with onset usually occurring in childhood and adolescence: Secondary | ICD-10-CM | POA: Diagnosis not present

## 2021-09-01 DIAGNOSIS — F411 Generalized anxiety disorder: Secondary | ICD-10-CM | POA: Diagnosis not present

## 2021-10-07 ENCOUNTER — Other Ambulatory Visit: Payer: Self-pay

## 2021-10-07 ENCOUNTER — Emergency Department (HOSPITAL_BASED_OUTPATIENT_CLINIC_OR_DEPARTMENT_OTHER): Payer: PPO

## 2021-10-07 ENCOUNTER — Encounter (HOSPITAL_BASED_OUTPATIENT_CLINIC_OR_DEPARTMENT_OTHER): Payer: Self-pay | Admitting: *Deleted

## 2021-10-07 ENCOUNTER — Inpatient Hospital Stay (HOSPITAL_BASED_OUTPATIENT_CLINIC_OR_DEPARTMENT_OTHER)
Admission: EM | Admit: 2021-10-07 | Discharge: 2021-10-24 | DRG: 356 | Disposition: A | Discharge status: Skilled Nursing Facility | Payer: PPO | Attending: Family Medicine | Admitting: Family Medicine

## 2021-10-07 DIAGNOSIS — R188 Other ascites: Secondary | ICD-10-CM

## 2021-10-07 DIAGNOSIS — M419 Scoliosis, unspecified: Secondary | ICD-10-CM | POA: Diagnosis not present

## 2021-10-07 DIAGNOSIS — K829 Disease of gallbladder, unspecified: Secondary | ICD-10-CM | POA: Diagnosis not present

## 2021-10-07 DIAGNOSIS — E871 Hypo-osmolality and hyponatremia: Secondary | ICD-10-CM | POA: Diagnosis present

## 2021-10-07 DIAGNOSIS — R7989 Other specified abnormal findings of blood chemistry: Secondary | ICD-10-CM | POA: Diagnosis present

## 2021-10-07 DIAGNOSIS — Z20822 Contact with and (suspected) exposure to covid-19: Secondary | ICD-10-CM | POA: Diagnosis present

## 2021-10-07 DIAGNOSIS — Z515 Encounter for palliative care: Secondary | ICD-10-CM | POA: Diagnosis not present

## 2021-10-07 DIAGNOSIS — D500 Iron deficiency anemia secondary to blood loss (chronic): Secondary | ICD-10-CM | POA: Diagnosis not present

## 2021-10-07 DIAGNOSIS — D75839 Thrombocytosis, unspecified: Secondary | ICD-10-CM | POA: Diagnosis present

## 2021-10-07 DIAGNOSIS — R0602 Shortness of breath: Secondary | ICD-10-CM | POA: Diagnosis not present

## 2021-10-07 DIAGNOSIS — D649 Anemia, unspecified: Secondary | ICD-10-CM | POA: Diagnosis present

## 2021-10-07 DIAGNOSIS — C220 Liver cell carcinoma: Secondary | ICD-10-CM | POA: Diagnosis not present

## 2021-10-07 DIAGNOSIS — C787 Secondary malignant neoplasm of liver and intrahepatic bile duct: Secondary | ICD-10-CM | POA: Diagnosis present

## 2021-10-07 DIAGNOSIS — R748 Abnormal levels of other serum enzymes: Secondary | ICD-10-CM | POA: Diagnosis present

## 2021-10-07 DIAGNOSIS — J449 Chronic obstructive pulmonary disease, unspecified: Secondary | ICD-10-CM | POA: Diagnosis present

## 2021-10-07 DIAGNOSIS — I499 Cardiac arrhythmia, unspecified: Secondary | ICD-10-CM | POA: Diagnosis not present

## 2021-10-07 DIAGNOSIS — C801 Malignant (primary) neoplasm, unspecified: Secondary | ICD-10-CM | POA: Diagnosis not present

## 2021-10-07 DIAGNOSIS — J9811 Atelectasis: Secondary | ICD-10-CM | POA: Diagnosis not present

## 2021-10-07 DIAGNOSIS — R932 Abnormal findings on diagnostic imaging of liver and biliary tract: Secondary | ICD-10-CM | POA: Diagnosis not present

## 2021-10-07 DIAGNOSIS — R180 Malignant ascites: Secondary | ICD-10-CM | POA: Diagnosis present

## 2021-10-07 DIAGNOSIS — K659 Peritonitis, unspecified: Secondary | ICD-10-CM | POA: Diagnosis not present

## 2021-10-07 DIAGNOSIS — R946 Abnormal results of thyroid function studies: Secondary | ICD-10-CM | POA: Diagnosis not present

## 2021-10-07 DIAGNOSIS — D630 Anemia in neoplastic disease: Secondary | ICD-10-CM | POA: Diagnosis present

## 2021-10-07 DIAGNOSIS — F419 Anxiety disorder, unspecified: Secondary | ICD-10-CM | POA: Diagnosis present

## 2021-10-07 DIAGNOSIS — C7802 Secondary malignant neoplasm of left lung: Secondary | ICD-10-CM | POA: Diagnosis present

## 2021-10-07 DIAGNOSIS — F909 Attention-deficit hyperactivity disorder, unspecified type: Secondary | ICD-10-CM | POA: Diagnosis present

## 2021-10-07 DIAGNOSIS — D72829 Elevated white blood cell count, unspecified: Secondary | ICD-10-CM | POA: Diagnosis present

## 2021-10-07 DIAGNOSIS — K625 Hemorrhage of anus and rectum: Secondary | ICD-10-CM | POA: Diagnosis not present

## 2021-10-07 DIAGNOSIS — J432 Centrilobular emphysema: Secondary | ICD-10-CM | POA: Diagnosis present

## 2021-10-07 DIAGNOSIS — M6281 Muscle weakness (generalized): Secondary | ICD-10-CM | POA: Diagnosis not present

## 2021-10-07 DIAGNOSIS — D509 Iron deficiency anemia, unspecified: Secondary | ICD-10-CM | POA: Diagnosis present

## 2021-10-07 DIAGNOSIS — I248 Other forms of acute ischemic heart disease: Secondary | ICD-10-CM | POA: Diagnosis present

## 2021-10-07 DIAGNOSIS — C786 Secondary malignant neoplasm of retroperitoneum and peritoneum: Secondary | ICD-10-CM | POA: Diagnosis present

## 2021-10-07 DIAGNOSIS — R7401 Elevation of levels of liver transaminase levels: Secondary | ICD-10-CM | POA: Diagnosis present

## 2021-10-07 DIAGNOSIS — I639 Cerebral infarction, unspecified: Secondary | ICD-10-CM | POA: Diagnosis not present

## 2021-10-07 DIAGNOSIS — K769 Liver disease, unspecified: Secondary | ICD-10-CM

## 2021-10-07 DIAGNOSIS — R54 Age-related physical debility: Secondary | ICD-10-CM | POA: Diagnosis present

## 2021-10-07 DIAGNOSIS — D508 Other iron deficiency anemias: Secondary | ICD-10-CM | POA: Diagnosis not present

## 2021-10-07 DIAGNOSIS — E43 Unspecified severe protein-calorie malnutrition: Secondary | ICD-10-CM | POA: Insufficient documentation

## 2021-10-07 DIAGNOSIS — G893 Neoplasm related pain (acute) (chronic): Secondary | ICD-10-CM | POA: Diagnosis present

## 2021-10-07 DIAGNOSIS — K5900 Constipation, unspecified: Secondary | ICD-10-CM | POA: Diagnosis not present

## 2021-10-07 DIAGNOSIS — C20 Malignant neoplasm of rectum: Secondary | ICD-10-CM

## 2021-10-07 DIAGNOSIS — C19 Malignant neoplasm of rectosigmoid junction: Secondary | ICD-10-CM | POA: Diagnosis present

## 2021-10-07 DIAGNOSIS — K5731 Diverticulosis of large intestine without perforation or abscess with bleeding: Secondary | ICD-10-CM | POA: Diagnosis not present

## 2021-10-07 DIAGNOSIS — E860 Dehydration: Secondary | ICD-10-CM | POA: Diagnosis present

## 2021-10-07 DIAGNOSIS — K644 Residual hemorrhoidal skin tags: Secondary | ICD-10-CM | POA: Diagnosis present

## 2021-10-07 DIAGNOSIS — K921 Melena: Secondary | ICD-10-CM | POA: Diagnosis not present

## 2021-10-07 DIAGNOSIS — R1084 Generalized abdominal pain: Secondary | ICD-10-CM | POA: Diagnosis present

## 2021-10-07 DIAGNOSIS — Z7401 Bed confinement status: Secondary | ICD-10-CM | POA: Diagnosis not present

## 2021-10-07 DIAGNOSIS — R778 Other specified abnormalities of plasma proteins: Secondary | ICD-10-CM | POA: Diagnosis present

## 2021-10-07 DIAGNOSIS — R770 Abnormality of albumin: Secondary | ICD-10-CM | POA: Diagnosis not present

## 2021-10-07 DIAGNOSIS — Z91041 Radiographic dye allergy status: Secondary | ICD-10-CM

## 2021-10-07 DIAGNOSIS — R918 Other nonspecific abnormal finding of lung field: Secondary | ICD-10-CM | POA: Diagnosis not present

## 2021-10-07 DIAGNOSIS — R945 Abnormal results of liver function studies: Secondary | ICD-10-CM | POA: Diagnosis not present

## 2021-10-07 DIAGNOSIS — Z681 Body mass index (BMI) 19 or less, adult: Secondary | ICD-10-CM

## 2021-10-07 DIAGNOSIS — K5730 Diverticulosis of large intestine without perforation or abscess without bleeding: Secondary | ICD-10-CM | POA: Diagnosis present

## 2021-10-07 DIAGNOSIS — Z880 Allergy status to penicillin: Secondary | ICD-10-CM

## 2021-10-07 DIAGNOSIS — K5790 Diverticulosis of intestine, part unspecified, without perforation or abscess without bleeding: Secondary | ICD-10-CM | POA: Diagnosis not present

## 2021-10-07 DIAGNOSIS — R933 Abnormal findings on diagnostic imaging of other parts of digestive tract: Secondary | ICD-10-CM | POA: Diagnosis not present

## 2021-10-07 DIAGNOSIS — C7801 Secondary malignant neoplasm of right lung: Secondary | ICD-10-CM | POA: Diagnosis present

## 2021-10-07 DIAGNOSIS — F17210 Nicotine dependence, cigarettes, uncomplicated: Secondary | ICD-10-CM | POA: Diagnosis present

## 2021-10-07 DIAGNOSIS — Z452 Encounter for adjustment and management of vascular access device: Secondary | ICD-10-CM | POA: Diagnosis not present

## 2021-10-07 DIAGNOSIS — R634 Abnormal weight loss: Secondary | ICD-10-CM | POA: Diagnosis not present

## 2021-10-07 DIAGNOSIS — R600 Localized edema: Secondary | ICD-10-CM | POA: Diagnosis not present

## 2021-10-07 DIAGNOSIS — K635 Polyp of colon: Secondary | ICD-10-CM | POA: Diagnosis not present

## 2021-10-07 DIAGNOSIS — I672 Cerebral atherosclerosis: Secondary | ICD-10-CM | POA: Diagnosis not present

## 2021-10-07 DIAGNOSIS — J439 Emphysema, unspecified: Secondary | ICD-10-CM | POA: Diagnosis not present

## 2021-10-07 DIAGNOSIS — Z743 Need for continuous supervision: Secondary | ICD-10-CM | POA: Diagnosis not present

## 2021-10-07 DIAGNOSIS — K648 Other hemorrhoids: Secondary | ICD-10-CM | POA: Diagnosis not present

## 2021-10-07 DIAGNOSIS — K7689 Other specified diseases of liver: Secondary | ICD-10-CM | POA: Diagnosis not present

## 2021-10-07 DIAGNOSIS — R911 Solitary pulmonary nodule: Secondary | ICD-10-CM | POA: Diagnosis not present

## 2021-10-07 DIAGNOSIS — C218 Malignant neoplasm of overlapping sites of rectum, anus and anal canal: Secondary | ICD-10-CM | POA: Diagnosis not present

## 2021-10-07 DIAGNOSIS — M47816 Spondylosis without myelopathy or radiculopathy, lumbar region: Secondary | ICD-10-CM | POA: Diagnosis not present

## 2021-10-07 DIAGNOSIS — J310 Chronic rhinitis: Secondary | ICD-10-CM | POA: Diagnosis not present

## 2021-10-07 DIAGNOSIS — C229 Malignant neoplasm of liver, not specified as primary or secondary: Secondary | ICD-10-CM | POA: Diagnosis not present

## 2021-10-07 DIAGNOSIS — R6889 Other general symptoms and signs: Secondary | ICD-10-CM | POA: Diagnosis not present

## 2021-10-07 DIAGNOSIS — Z1159 Encounter for screening for other viral diseases: Secondary | ICD-10-CM | POA: Diagnosis not present

## 2021-10-07 DIAGNOSIS — Z79899 Other long term (current) drug therapy: Secondary | ICD-10-CM

## 2021-10-07 LAB — HEPATITIS PANEL, ACUTE
HCV Ab: NONREACTIVE
Hep A IgM: NONREACTIVE
Hep B C IgM: NONREACTIVE
Hepatitis B Surface Ag: NONREACTIVE

## 2021-10-07 LAB — URINALYSIS, ROUTINE W REFLEX MICROSCOPIC
Glucose, UA: NEGATIVE mg/dL
Hgb urine dipstick: NEGATIVE
Ketones, ur: NEGATIVE mg/dL
Leukocytes,Ua: NEGATIVE
Nitrite: NEGATIVE
Protein, ur: NEGATIVE mg/dL
Specific Gravity, Urine: >=1.03 (ref 1.005–1.030)
pH: 5.5 (ref 5.0–8.0)

## 2021-10-07 LAB — CBC WITH DIFFERENTIAL/PLATELET
Abs Immature Granulocytes: 0.04 10*3/uL (ref 0.00–0.07)
Basophils Absolute: 0.1 10*3/uL (ref 0.0–0.1)
Basophils Relative: 0 %
Eosinophils Absolute: 0 10*3/uL (ref 0.0–0.5)
Eosinophils Relative: 0 %
HCT: 34.2 % — ABNORMAL LOW (ref 39.0–52.0)
Hemoglobin: 10.5 g/dL — ABNORMAL LOW (ref 13.0–17.0)
Immature Granulocytes: 0 %
Lymphocytes Relative: 9 %
Lymphs Abs: 1.1 10*3/uL (ref 0.7–4.0)
MCH: 25.2 pg — ABNORMAL LOW (ref 26.0–34.0)
MCHC: 30.7 g/dL (ref 30.0–36.0)
MCV: 82.2 fL (ref 80.0–100.0)
Monocytes Absolute: 0.7 10*3/uL (ref 0.1–1.0)
Monocytes Relative: 6 %
Neutro Abs: 10 10*3/uL — ABNORMAL HIGH (ref 1.7–7.7)
Neutrophils Relative %: 85 %
Platelets: 530 10*3/uL — ABNORMAL HIGH (ref 150–400)
RBC: 4.16 MIL/uL — ABNORMAL LOW (ref 4.22–5.81)
RDW: 17.6 % — ABNORMAL HIGH (ref 11.5–15.5)
WBC: 12 10*3/uL — ABNORMAL HIGH (ref 4.0–10.5)
nRBC: 0 % (ref 0.0–0.2)

## 2021-10-07 LAB — COMPREHENSIVE METABOLIC PANEL
ALT: 30 U/L (ref 0–44)
AST: 88 U/L — ABNORMAL HIGH (ref 15–41)
Albumin: 2.6 g/dL — ABNORMAL LOW (ref 3.5–5.0)
Alkaline Phosphatase: 418 U/L — ABNORMAL HIGH (ref 38–126)
Anion gap: 12 (ref 5–15)
BUN: 10 mg/dL (ref 8–23)
CO2: 28 mmol/L (ref 22–32)
Calcium: 8.6 mg/dL — ABNORMAL LOW (ref 8.9–10.3)
Chloride: 93 mmol/L — ABNORMAL LOW (ref 98–111)
Creatinine, Ser: 0.65 mg/dL (ref 0.61–1.24)
GFR, Estimated: >60 mL/min (ref >60)
Glucose, Bld: 85 mg/dL (ref 70–99)
Potassium: 3.7 mmol/L (ref 3.5–5.1)
Sodium: 133 mmol/L — ABNORMAL LOW (ref 135–145)
Total Bilirubin: 0.8 mg/dL (ref 0.3–1.2)
Total Protein: 7.5 g/dL (ref 6.5–8.1)

## 2021-10-07 LAB — LACTIC ACID, PLASMA
Lactic Acid, Venous: 3.4 mmol/L (ref 0.5–1.9)
Lactic Acid, Venous: 2.5 mmol/L (ref 0.5–1.9)

## 2021-10-07 LAB — BRAIN NATRIURETIC PEPTIDE: B Natriuretic Peptide: 351.8 pg/mL — ABNORMAL HIGH (ref 0.0–100.0)

## 2021-10-07 LAB — COOXEMETRY PANEL
Carboxyhemoglobin: 2.4 % — ABNORMAL HIGH (ref 0.5–1.5)
Methemoglobin: 0.9 % (ref 0.0–1.5)
O2 Saturation: 36.2 %
Total hemoglobin: 10.2 g/dL — ABNORMAL LOW (ref 12.0–16.0)

## 2021-10-07 LAB — AMMONIA: Ammonia: 19 umol/L (ref 9–35)

## 2021-10-07 LAB — RESP PANEL BY RT-PCR (FLU A&B, COVID) ARPGX2
Influenza A by PCR: NEGATIVE
Influenza B by PCR: NEGATIVE
SARS Coronavirus 2 by RT PCR: NEGATIVE

## 2021-10-07 LAB — PROTIME-INR
INR: 1.1 (ref 0.8–1.2)
Prothrombin Time: 14.7 seconds (ref 11.4–15.2)

## 2021-10-07 LAB — TROPONIN I (HIGH SENSITIVITY)
Troponin I (High Sensitivity): 27 ng/L — ABNORMAL HIGH (ref <18)
Troponin I (High Sensitivity): 25 ng/L — ABNORMAL HIGH (ref <18)

## 2021-10-07 LAB — LIPASE, BLOOD: Lipase: 20 U/L (ref 11–51)

## 2021-10-07 NOTE — ED Triage Notes (Signed)
Abdominal pain, swelling and sob x 4 weeks. He was seen at [facility] today and told to come here for further evaluation.
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
## 2021-10-07 NOTE — ED Notes (Signed)
Report given to [facility] via phone. 

## 2021-10-07 NOTE — ED Notes (Signed)
Ambulated in room, SpO2 93-98% on r/a, HR 85-92.  CO2% 5, patient admits to smoking ~1 -2 hours before coming in for evaluation.

## 2021-10-07 NOTE — Plan of Care (Addendum)
[facility] transfer note:  65 yo M with abd pain, SOB, still smokes.  Work up = looks like metastatic dz to liver.  Admitting to [facility] tele.  Probably needs paracentesis (theraputic and diagnostic), and biopsy.  [facility] will assume care on arrival to accepting facility. Until arrival, care as per EDP. However, [facility] available 24/7 for questions and assistance.  Nursing staff, please page [place] and Consults [phone]) as soon as the patient arrives the hospital.

## 2021-10-07 NOTE — ED Notes (Signed)
Report called to [facility], Therapist, sports.

## 2021-10-08 ENCOUNTER — Encounter (HOSPITAL_COMMUNITY): Payer: Self-pay | Admitting: Internal Medicine

## 2021-10-08 ENCOUNTER — Observation Stay (HOSPITAL_COMMUNITY): Payer: PPO

## 2021-10-08 DIAGNOSIS — F419 Anxiety disorder, unspecified: Secondary | ICD-10-CM | POA: Diagnosis not present

## 2021-10-08 DIAGNOSIS — J449 Chronic obstructive pulmonary disease, unspecified: Secondary | ICD-10-CM | POA: Diagnosis not present

## 2021-10-08 DIAGNOSIS — R7989 Other specified abnormal findings of blood chemistry: Secondary | ICD-10-CM | POA: Diagnosis present

## 2021-10-08 DIAGNOSIS — R778 Other specified abnormalities of plasma proteins: Secondary | ICD-10-CM | POA: Diagnosis not present

## 2021-10-08 DIAGNOSIS — D649 Anemia, unspecified: Secondary | ICD-10-CM | POA: Diagnosis not present

## 2021-10-08 DIAGNOSIS — R911 Solitary pulmonary nodule: Secondary | ICD-10-CM | POA: Diagnosis not present

## 2021-10-08 DIAGNOSIS — F909 Attention-deficit hyperactivity disorder, unspecified type: Secondary | ICD-10-CM | POA: Diagnosis present

## 2021-10-08 DIAGNOSIS — R0602 Shortness of breath: Secondary | ICD-10-CM | POA: Diagnosis not present

## 2021-10-08 DIAGNOSIS — R188 Other ascites: Secondary | ICD-10-CM

## 2021-10-08 DIAGNOSIS — R748 Abnormal levels of other serum enzymes: Secondary | ICD-10-CM | POA: Diagnosis not present

## 2021-10-08 DIAGNOSIS — K659 Peritonitis, unspecified: Secondary | ICD-10-CM | POA: Diagnosis not present

## 2021-10-08 DIAGNOSIS — C787 Secondary malignant neoplasm of liver and intrahepatic bile duct: Secondary | ICD-10-CM | POA: Diagnosis not present

## 2021-10-08 DIAGNOSIS — I672 Cerebral atherosclerosis: Secondary | ICD-10-CM | POA: Diagnosis not present

## 2021-10-08 DIAGNOSIS — J9811 Atelectasis: Secondary | ICD-10-CM | POA: Diagnosis not present

## 2021-10-08 DIAGNOSIS — R180 Malignant ascites: Secondary | ICD-10-CM | POA: Diagnosis not present

## 2021-10-08 DIAGNOSIS — R918 Other nonspecific abnormal finding of lung field: Secondary | ICD-10-CM | POA: Diagnosis not present

## 2021-10-08 DIAGNOSIS — E860 Dehydration: Secondary | ICD-10-CM | POA: Diagnosis not present

## 2021-10-08 DIAGNOSIS — I639 Cerebral infarction, unspecified: Secondary | ICD-10-CM | POA: Diagnosis not present

## 2021-10-08 DIAGNOSIS — J439 Emphysema, unspecified: Secondary | ICD-10-CM | POA: Diagnosis not present

## 2021-10-08 DIAGNOSIS — R634 Abnormal weight loss: Secondary | ICD-10-CM | POA: Diagnosis present

## 2021-10-08 LAB — COMPREHENSIVE METABOLIC PANEL
ALT: 27 U/L (ref 0–44)
AST: 83 U/L — ABNORMAL HIGH (ref 15–41)
Albumin: 2.4 g/dL — ABNORMAL LOW (ref 3.5–5.0)
Alkaline Phosphatase: 341 U/L — ABNORMAL HIGH (ref 38–126)
Anion gap: 11 (ref 5–15)
BUN: 11 mg/dL (ref 8–23)
CO2: 26 mmol/L (ref 22–32)
Calcium: 8.7 mg/dL — ABNORMAL LOW (ref 8.9–10.3)
Chloride: 97 mmol/L — ABNORMAL LOW (ref 98–111)
Creatinine, Ser: 0.56 mg/dL — ABNORMAL LOW (ref 0.61–1.24)
GFR, Estimated: >60 mL/min (ref >60)
Glucose, Bld: 86 mg/dL (ref 70–99)
Potassium: 4.2 mmol/L (ref 3.5–5.1)
Sodium: 134 mmol/L — ABNORMAL LOW (ref 135–145)
Total Bilirubin: 1.2 mg/dL (ref 0.3–1.2)
Total Protein: 6.7 g/dL (ref 6.5–8.1)

## 2021-10-08 LAB — RETICULOCYTES
Immature Retic Fract: 18.8 % — ABNORMAL HIGH (ref 2.3–15.9)
RBC.: 3.82 MIL/uL — ABNORMAL LOW (ref 4.22–5.81)
Retic Count, Absolute: 65.3 10*3/uL (ref 19.0–186.0)
Retic Ct Pct: 1.7 % (ref 0.4–3.1)

## 2021-10-08 LAB — BODY FLUID CELL COUNT WITH DIFFERENTIAL
Eos, Fluid: 1 %
Lymphs, Fluid: 10 %
Monocyte-Macrophage-Serous Fluid: 85 % (ref 50–90)
Neutrophil Count, Fluid: 4 % (ref 0–25)
Total Nucleated Cell Count, Fluid: 213 cu mm (ref 0–1000)

## 2021-10-08 LAB — ALBUMIN, PLEURAL OR PERITONEAL FLUID: Albumin, Fluid: <1.5 g/dL

## 2021-10-08 LAB — CBC WITH DIFFERENTIAL/PLATELET
Abs Immature Granulocytes: 0.06 10*3/uL (ref 0.00–0.07)
Basophils Absolute: 0 10*3/uL (ref 0.0–0.1)
Basophils Relative: 0 %
Eosinophils Absolute: 0 10*3/uL (ref 0.0–0.5)
Eosinophils Relative: 0 %
HCT: 31.8 % — ABNORMAL LOW (ref 39.0–52.0)
Hemoglobin: 9.8 g/dL — ABNORMAL LOW (ref 13.0–17.0)
Immature Granulocytes: 1 %
Lymphocytes Relative: 8 %
Lymphs Abs: 1 10*3/uL (ref 0.7–4.0)
MCH: 25.2 pg — ABNORMAL LOW (ref 26.0–34.0)
MCHC: 30.8 g/dL (ref 30.0–36.0)
MCV: 81.7 fL (ref 80.0–100.0)
Monocytes Absolute: 0.7 10*3/uL (ref 0.1–1.0)
Monocytes Relative: 6 %
Neutro Abs: 10.4 10*3/uL — ABNORMAL HIGH (ref 1.7–7.7)
Neutrophils Relative %: 85 %
Platelets: 541 10*3/uL — ABNORMAL HIGH (ref 150–400)
RBC: 3.89 MIL/uL — ABNORMAL LOW (ref 4.22–5.81)
RDW: 17.9 % — ABNORMAL HIGH (ref 11.5–15.5)
WBC: 12.3 10*3/uL — ABNORMAL HIGH (ref 4.0–10.5)
nRBC: 0 % (ref 0.0–0.2)

## 2021-10-08 LAB — IRON AND TIBC
Iron: 21 ug/dL — ABNORMAL LOW (ref 45–182)
Saturation Ratios: 12 % — ABNORMAL LOW (ref 17.9–39.5)
TIBC: 175 ug/dL — ABNORMAL LOW (ref 250–450)
UIBC: 154 ug/dL

## 2021-10-08 LAB — ECHOCARDIOGRAM COMPLETE
Area-P 1/2: 1.85 cm2
Height: 66 in
P 1/2 time: 411 msec
S' Lateral: 2.3 cm
Weight: 1890.66 oz

## 2021-10-08 LAB — PHOSPHORUS
Phosphorus: 3.4 mg/dL (ref 2.5–4.6)
Phosphorus: 3.6 mg/dL (ref 2.5–4.6)

## 2021-10-08 LAB — FOLATE: Folate: 6 ng/mL (ref >5.9)

## 2021-10-08 LAB — PROTEIN, PLEURAL OR PERITONEAL FLUID: Total protein, fluid: 3.2 g/dL

## 2021-10-08 LAB — MAGNESIUM
Magnesium: 1.9 mg/dL (ref 1.7–2.4)
Magnesium: 2 mg/dL (ref 1.7–2.4)

## 2021-10-08 LAB — TSH: TSH: 12.065 u[IU]/mL — ABNORMAL HIGH (ref 0.350–4.500)

## 2021-10-08 LAB — CK
Total CK: 560 U/L — ABNORMAL HIGH (ref 49–397)
Total CK: 555 U/L — ABNORMAL HIGH (ref 49–397)

## 2021-10-08 LAB — HIV ANTIBODY (ROUTINE TESTING W REFLEX): HIV Screen 4th Generation wRfx: NONREACTIVE

## 2021-10-08 LAB — LACTATE DEHYDROGENASE, PLEURAL OR PERITONEAL FLUID: LD, Fluid: 402 U/L — ABNORMAL HIGH (ref 3–23)

## 2021-10-08 LAB — PREALBUMIN: Prealbumin: <5 mg/dL — ABNORMAL LOW (ref 18–38)

## 2021-10-08 LAB — LACTIC ACID, PLASMA
Lactic Acid, Venous: 2.4 mmol/L (ref 0.5–1.9)
Lactic Acid, Venous: 1.8 mmol/L (ref 0.5–1.9)

## 2021-10-08 LAB — VITAMIN B12: Vitamin B-12: 1242 pg/mL — ABNORMAL HIGH (ref 180–914)

## 2021-10-08 LAB — LACTATE DEHYDROGENASE: LDH: 1538 U/L — ABNORMAL HIGH (ref 98–192)

## 2021-10-08 LAB — FERRITIN: Ferritin: 1566 ng/mL — ABNORMAL HIGH (ref 24–336)

## 2021-10-08 MED ORDER — HYDROCODONE-ACETAMINOPHEN 5-325 MG PO TABS
1.0000 | ORAL_TABLET | ORAL | Status: DC | PRN
  Administered 2021-10-10 (×3): 1 via ORAL
  Administered 2021-10-11 – 2021-10-13 (×9): 2 via ORAL
  Filled 2021-10-08 (×2): qty 2
  Filled 2021-10-08: qty 1
  Filled 2021-10-08 (×2): qty 2
  Filled 2021-10-08: qty 1
  Filled 2021-10-08 (×5): qty 2
  Filled 2021-10-08: qty 1

## 2021-10-08 MED ORDER — ACETAMINOPHEN 325 MG PO TABS: 650.0000 mg | ORAL_TABLET | Freq: Four times a day (QID) | ORAL | Status: DC | PRN

## 2021-10-08 MED ORDER — LIDOCAINE HCL 1 % IJ SOLN
INTRAMUSCULAR | Status: AC
  Administered 2021-10-08: 11:00:00 15 mL
  Filled 2021-10-08: qty 20

## 2021-10-08 MED ORDER — ENSURE ENLIVE PO LIQD
237.0000 mL | Freq: Two times a day (BID) | ORAL | Status: DC
  Administered 2021-10-09 – 2021-10-23 (×19): 237 mL via ORAL

## 2021-10-08 MED ORDER — ACETAMINOPHEN 650 MG RE SUPP: 650.0000 mg | Freq: Four times a day (QID) | RECTAL | Status: DC | PRN

## 2021-10-08 MED ORDER — AMPHETAMINE-DEXTROAMPHETAMINE 10 MG PO TABS
20.0000 mg | ORAL_TABLET | Freq: Two times a day (BID) | ORAL | Status: DC
  Administered 2021-10-08 – 2021-10-20 (×24): 20 mg via ORAL
  Filled 2021-10-08 (×24): qty 2

## 2021-10-08 MED ORDER — ALBUTEROL SULFATE (2.5 MG/3ML) 0.083% IN NEBU: 2.5000 mg | INHALATION_SOLUTION | RESPIRATORY_TRACT | Status: DC | PRN

## 2021-10-08 MED ORDER — ALPRAZOLAM 0.5 MG PO TABS
0.5000 mg | ORAL_TABLET | Freq: Once | ORAL | Status: AC
  Administered 2021-10-08: 02:00:00 0.5 mg via ORAL
  Filled 2021-10-08: qty 1

## 2021-10-08 MED ORDER — SODIUM CHLORIDE 0.9% FLUSH
3.0000 mL | Freq: Two times a day (BID) | INTRAVENOUS | Status: DC
  Administered 2021-10-08 – 2021-10-23 (×28): 3 mL via INTRAVENOUS

## 2021-10-08 MED ORDER — ALBUMIN HUMAN 5 % IV SOLN
12.5000 g | Freq: Once | INTRAVENOUS | Status: AC
  Administered 2021-10-08: 03:00:00 12.5 g via INTRAVENOUS
  Filled 2021-10-08: qty 250

## 2021-10-08 MED ORDER — SODIUM CHLORIDE 0.9 % IV SOLN
250.0000 mL | INTRAVENOUS | Status: DC | PRN
  Administered 2021-10-08 (×2): 250 mL via INTRAVENOUS

## 2021-10-08 MED ORDER — AMPHETAMINE-DEXTROAMPHETAMINE 10 MG PO TABS: 10.0000 mg | ORAL_TABLET | Freq: Three times a day (TID) | ORAL | Status: DC

## 2021-10-08 MED ORDER — THIAMINE HCL 100 MG/ML IJ SOLN
100.0000 mg | Freq: Every day | INTRAMUSCULAR | Status: DC
  Administered 2021-10-08 – 2021-10-09 (×2): 100 mg via INTRAVENOUS
  Filled 2021-10-08 (×2): qty 2

## 2021-10-08 MED ORDER — SODIUM CHLORIDE 0.9% FLUSH: 3.0000 mL | INTRAVENOUS | Status: DC | PRN

## 2021-10-08 NOTE — Progress Notes (Signed)
Date and time results received: 10/08/21 0119  Test: Lactic Acid Critical Value: 2.4  Name of Provider Notified: Clarene Essex and Doutova.   Orders Received? Or Actions Taken?:  Doutova at bedside.
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
## 2021-10-08 NOTE — Assessment & Plan Note (Signed)
Order IR paracentesis in a.m. n.p.o. postmidnight.  Labs ordered.  May need albumin administration

## 2021-10-08 NOTE — Plan of Care (Signed)
°  Problem: Health Behavior/Discharge Planning: Goal: Ability to manage health-related needs will improve Outcome: Progressing   Problem: Clinical Measurements: Goal: Will remain free from infection Outcome: Progressing Goal: Diagnostic test results will improve Outcome: Progressing   Problem: Clinical Measurements: Goal: Respiratory complications will improve Outcome: Not Progressing   Problem: Activity: Goal: Risk for activity intolerance will decrease Outcome: Not Progressing   Problem: Nutrition: Goal: Adequate nutrition will be maintained Outcome: Not Progressing

## 2021-10-08 NOTE — Progress Notes (Signed)
° °  Patient seen and examined at bedside, patient admitted after midnight, please see earlier detailed admission note by Toy Baker, MD. Briefly, patient presented secondary to shortness of breath and abdominal pain, found to have abdominal ascites with liver lesions concerning for metastatic cancer of unknown primary  Subjective: Feeling a little better after paracentesis. No confusion. No significant abdominal pain.  BP (!) 158/75    Pulse (!) 56    Temp 98.3 F (36.8 C) (Oral)    Resp 18    Ht 5\' 6"  (1.676 m)    Wt 53.6 kg    SpO2 94%    BMI 19.07 kg/m   General exam: Appears calm and comfortable Respiratory system: Diminished. Respiratory effort normal. Cardiovascular system: S1 & S2 heard, RRR. No murmurs, rubs, gallops or clicks. Gastrointestinal system: Abdomen is distended, soft and mildly tender. No organomegaly or masses felt. Normal bowel sounds heard. Central nervous system: Alert and oriented. No focal neurological deficits. Musculoskeletal: Foot edema. No calf tenderness Skin: No cyanosis. No rashes Psychiatry: Judgement and insight appear normal. Mood & affect appropriate.   Brief assessment/Plan:  Liver lesions Likely metastatic. -Order CT head/chest. Patient with contrast allergy  Abdominal ascites History of alcohol use. Paracentesis performed on 12/23 with cytology, cultures, cell count. Concerning for metastatic ascitic fluid. -Follow-up fluid studies  Shortness of breath -PT/OT recommendations   Family communication: Patient declined for me to call family DVT prophylaxis: SCDs Disposition: Discharge home vs SNF in 1-2 days pending PT/OT evaluation. Workup for lesions, however could possibly be completed as an outpatient pending results  Cordelia Poche, MD [facility] 10/08/2021, 1:21 PM

## 2021-10-08 NOTE — Assessment & Plan Note (Signed)
Given a dose of Xanax.  Patient states he takes 1 mg Xanax twice daily.  May need additional doses but will first make sure patient is able to tolerate given underlying liver disease

## 2021-10-08 NOTE — Procedures (Signed)
Ultrasound-guided diagnostic and therapeutic paracentesis performed yielding 1.6 liters of clear, yellow fluid. No immediate complications.  The fluid was submitted to the lab for studies. EBL none.

## 2021-10-08 NOTE — Progress Notes (Signed)
°  Transition of Care [facility]) Screening Note   Patient Details  Name: Jason Mcguire Date of Birth: [birth year]   Transition of Care [facility]) CM/SW Contact:    Shriya Aker, Marjie Skiff, RN Phone Number: 10/08/2021, 10:03 AM    Transition of Care Department [facility]) has reviewed patient and no TOC needs have been identified at this time. We will continue to monitor patient advancement through interdisciplinary progression rounds. If new patient transition needs arise, please place a TOC consult.

## 2021-10-08 NOTE — Progress Notes (Signed)
PT Cancellation Note  Patient Details Name: Jason Mcguire MRN: [id number] DOB: 06-Aug-1956   Cancelled Treatment:    Reason Eval/Treat Not Completed: Patient declined, no reason specified Politely declines PT this afternoon- did not rest well last night and had very busy day now very fatigued, just got back from CT. Asks if we can return in the morning. Will continue efforts.   Windell Norfolk, DPT, PN2   Supplemental Physical Therapist [place]    Pager [phone] Acute Rehab Office [phone]

## 2021-10-08 NOTE — Assessment & Plan Note (Signed)
With decreased PO intake Given ascites and low albumin will administer albumin and monitor kidney function
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
## 2021-10-08 NOTE — Progress Notes (Signed)
°  Echocardiogram 2D Echocardiogram has been performed.  Darlina Sicilian M 10/08/2021, 2:23 PM

## 2021-10-08 NOTE — Assessment & Plan Note (Signed)
Stable continue home medications 

## 2021-10-08 NOTE — Assessment & Plan Note (Addendum)
Likely secondary to dehydration and liver disease.  No evidence of sepsis at this time. Continue to follow

## 2021-10-08 NOTE — Assessment & Plan Note (Signed)
Chronic stable albuterol as needed

## 2021-10-08 NOTE — Assessment & Plan Note (Signed)
Obtain anemia panel Hemoccult stool 

## 2021-10-08 NOTE — Assessment & Plan Note (Signed)
Likely in the setting of dehydration rehydrated with albumin and recheck in a.m.

## 2021-10-08 NOTE — Assessment & Plan Note (Addendum)
Suspect secondary to underlying malignancy.  Of unclear primary.  Would benefit from nutritional consult Poor PO intake check prealbumin and administer thiamine

## 2021-10-08 NOTE — Assessment & Plan Note (Signed)
-  no chest pain no EKG changes in the setting of  increased work of breathing   likely due to demand ischemia and poor clearance, monitor on telemetry and cycle cardiac enzymes to trend.  if continues to rise will need further work-up [place] no chest pain  given anasarca obtain echo

## 2021-10-08 NOTE — Assessment & Plan Note (Addendum)
Unclear primary.  Patient has not had colonoscopy.  Denies any history of liver disease social drinker only.  Emailed oncology.  Ordered IR paracentesis.  May need liver biopsy if nondiagnostic. Order CEA N.p.o. postmidnight for paracentesis May benefit from albumin administration Will need to further imaging for staging Including head and chest would first make sure that renal function is stable after thoracentesis.  As he will need contrasted study If shortness of breath persists after paracentesis may benefit from CTA

## 2021-10-09 ENCOUNTER — Observation Stay (HOSPITAL_COMMUNITY): Payer: PPO

## 2021-10-09 DIAGNOSIS — C220 Liver cell carcinoma: Secondary | ICD-10-CM | POA: Diagnosis not present

## 2021-10-09 DIAGNOSIS — K829 Disease of gallbladder, unspecified: Secondary | ICD-10-CM | POA: Diagnosis not present

## 2021-10-09 DIAGNOSIS — F909 Attention-deficit hyperactivity disorder, unspecified type: Secondary | ICD-10-CM | POA: Diagnosis not present

## 2021-10-09 DIAGNOSIS — D509 Iron deficiency anemia, unspecified: Secondary | ICD-10-CM

## 2021-10-09 DIAGNOSIS — F419 Anxiety disorder, unspecified: Secondary | ICD-10-CM | POA: Diagnosis not present

## 2021-10-09 DIAGNOSIS — C801 Malignant (primary) neoplasm, unspecified: Secondary | ICD-10-CM | POA: Diagnosis not present

## 2021-10-09 DIAGNOSIS — K7689 Other specified diseases of liver: Secondary | ICD-10-CM | POA: Diagnosis not present

## 2021-10-09 DIAGNOSIS — R188 Other ascites: Secondary | ICD-10-CM | POA: Diagnosis not present

## 2021-10-09 DIAGNOSIS — C787 Secondary malignant neoplasm of liver and intrahepatic bile duct: Secondary | ICD-10-CM | POA: Diagnosis not present

## 2021-10-09 DIAGNOSIS — R634 Abnormal weight loss: Secondary | ICD-10-CM | POA: Diagnosis not present

## 2021-10-09 DIAGNOSIS — R180 Malignant ascites: Secondary | ICD-10-CM | POA: Diagnosis not present

## 2021-10-09 LAB — COMPREHENSIVE METABOLIC PANEL
ALT: 22 U/L (ref 0–44)
AST: 62 U/L — ABNORMAL HIGH (ref 15–41)
Albumin: 2.4 g/dL — ABNORMAL LOW (ref 3.5–5.0)
Alkaline Phosphatase: 382 U/L — ABNORMAL HIGH (ref 38–126)
Anion gap: 12 (ref 5–15)
BUN: 15 mg/dL (ref 8–23)
CO2: 27 mmol/L (ref 22–32)
Calcium: 8.6 mg/dL — ABNORMAL LOW (ref 8.9–10.3)
Chloride: 97 mmol/L — ABNORMAL LOW (ref 98–111)
Creatinine, Ser: 0.59 mg/dL — ABNORMAL LOW (ref 0.61–1.24)
GFR, Estimated: >60 mL/min (ref >60)
Glucose, Bld: 101 mg/dL — ABNORMAL HIGH (ref 70–99)
Potassium: 3.9 mmol/L (ref 3.5–5.1)
Sodium: 136 mmol/L (ref 135–145)
Total Bilirubin: 0.8 mg/dL (ref 0.3–1.2)
Total Protein: 6.4 g/dL — ABNORMAL LOW (ref 6.5–8.1)

## 2021-10-09 LAB — CBC
HCT: 32.5 % — ABNORMAL LOW (ref 39.0–52.0)
Hemoglobin: 10 g/dL — ABNORMAL LOW (ref 13.0–17.0)
MCH: 25.4 pg — ABNORMAL LOW (ref 26.0–34.0)
MCHC: 30.8 g/dL (ref 30.0–36.0)
MCV: 82.5 fL (ref 80.0–100.0)
Platelets: 530 10*3/uL — ABNORMAL HIGH (ref 150–400)
RBC: 3.94 MIL/uL — ABNORMAL LOW (ref 4.22–5.81)
RDW: 17.9 % — ABNORMAL HIGH (ref 11.5–15.5)
WBC: 13 10*3/uL — ABNORMAL HIGH (ref 4.0–10.5)
nRBC: 0 % (ref 0.0–0.2)

## 2021-10-09 LAB — T4, FREE: Free T4: 0.84 ng/dL (ref 0.61–1.12)

## 2021-10-09 LAB — CEA: CEA: 2798 ng/mL — ABNORMAL HIGH (ref 0.0–4.7)

## 2021-10-09 LAB — URINE CULTURE: Culture: NO GROWTH

## 2021-10-09 MED ORDER — THIAMINE HCL 100 MG PO TABS
100.0000 mg | ORAL_TABLET | Freq: Every day | ORAL | Status: DC
  Administered 2021-10-10 – 2021-10-23 (×13): 100 mg via ORAL
  Filled 2021-10-09 (×13): qty 1

## 2021-10-09 MED ORDER — ENOXAPARIN SODIUM 40 MG/0.4ML IJ SOSY
40.0000 mg | PREFILLED_SYRINGE | INTRAMUSCULAR | Status: DC
  Administered 2021-10-09 – 2021-10-10 (×2): 40 mg via SUBCUTANEOUS
  Filled 2021-10-09 (×2): qty 0.4

## 2021-10-09 MED ORDER — GADOBUTROL 1 MMOL/ML IV SOLN
5.0000 mL | Freq: Once | INTRAVENOUS | Status: AC | PRN
  Administered 2021-10-09: 19:00:00 5 mL via INTRAVENOUS

## 2021-10-09 MED ORDER — ALPRAZOLAM 1 MG PO TABS
1.0000 mg | ORAL_TABLET | Freq: Two times a day (BID) | ORAL | Status: DC | PRN
  Administered 2021-10-09 – 2021-10-12 (×5): 1 mg via ORAL
  Filled 2021-10-09 (×5): qty 1

## 2021-10-09 NOTE — Evaluation (Signed)
Occupational Therapy Evaluation Patient Details Name: Jason Mcguire MRN: [id number] DOB: 10-31-1955 Today's Date: 10/09/2021   History of Present Illness Patient is a 65 year old male who presented to the hospital with shortness of breath and BLE edema. patient was fount o have malignant neoplasm metastatic to liver, SOB, adbominal ascites, and dehydration.patient underwent paracentesis on 12/23 removing 1.6L of fluid. PMH: alcohol use, ADD, asthma, and COPD.   Clinical Impression   Patient is a 65 year old male who was admitted for above. Patient was independent in ADLs and IADLs prior level with no AD. Currently, patient is noted to have decreased functional activity tolerance, decreased endurance, decreased standing balance, decreased safety awareness and decreased knowledge of AD/AE impacting participation in ADLs. Patient at current level would need 24/7 support in next level of care to be able to transition home safely. Patient would continue to benefit from skilled OT services at this time while admitted and after d/c to address noted deficits in order to improve overall safety and independence in ADLs.        Recommendations for follow up therapy are one component of a multi-disciplinary discharge planning process, led by the attending physician.  Recommendations may be updated based on patient status, additional functional criteria and insurance authorization.   Follow Up Recommendations  Home health OT    Assistance Recommended at Discharge Frequent or constant Supervision/Assistance  Functional Status Assessment  Patient has had a recent decline in their functional status and demonstrates the ability to make significant improvements in function in a reasonable and predictable amount of time.  Equipment Recommendations  Other (comment) (TBD)    Recommendations for Other Services       Precautions / Restrictions Precautions Precautions: Fall Precaution Comments: monitor  vitals Restrictions Weight Bearing Restrictions: No      Mobility Bed Mobility                    Transfers                          Balance                                           ADL either performed or assessed with clinical judgement   ADL Overall ADL's : Needs assistance/impaired     Grooming: Wash/dry face;Wash/dry hands;Sitting Grooming Details (indicate cue type and reason): EOB with SOB noted. O2 maintained 98% on RA. noted to have edema in abdominal area with patient reporting increase since previous day. Upper Body Bathing: Sitting;Set up Upper Body Bathing Details (indicate cue type and reason): on EOB Lower Body Bathing: Moderate assistance;Sitting/lateral leans;Sit to/from stand Lower Body Bathing Details (indicate cue type and reason): patient was noted to have hard time with reaching BLE with patient choosing to bend in half v.s. bringin legs to lap. Upper Body Dressing : Set up;Sitting Upper Body Dressing Details (indicate cue type and reason): EOB hospital gown Lower Body Dressing: Sit to/from stand;Sitting/lateral leans;Minimal assistance     Toilet Transfer Details (indicate cue type and reason): patient declined reporting he was dizzy from hearing news from MD this AM. patient was able to stand from edge of bed to change hospital gown from under patient with min guard. Toileting- Clothing Manipulation and Hygiene: Minimal assistance;Sit to/from stand  General ADL Comments: patients evaluation was limited with patient having received difficult news from MD during session. patient was educated on importance of speaking to loved ones and chalin services available at this hosptial. patient verbalized understanding. patients thoughts and concerns about current health status where heard and validated. nurse and MD updated on patients participation in session.     Vision Patient Visual Report: No change from  baseline       Perception     Praxis      Pertinent Vitals/Pain Pain Assessment: No/denies pain     Hand Dominance Right   Extremity/Trunk Assessment Upper Extremity Assessment Upper Extremity Assessment: Generalized weakness   Lower Extremity Assessment Lower Extremity Assessment: Defer to PT evaluation   Cervical / Trunk Assessment Cervical / Trunk Assessment: Normal   Communication Communication Communication: No difficulties   Cognition Arousal/Alertness: Awake/alert Behavior During Therapy: WFL for tasks assessed/performed Overall Cognitive Status: Within Functional Limits for tasks assessed                                       General Comments       Exercises     Shoulder Instructions      Home Living Family/patient expects to be discharged to:: Private residence Living Arrangements: Alone Available Help at Discharge: Family;Available PRN/intermittently Type of Home: House                       Home Equipment: None          Prior Functioning/Environment Prior Level of Function : Independent/Modified Independent               ADLs Comments: patient reported he was still working on " a new job" prior to hospitalization        OT Problem List: Decreased activity tolerance;Impaired balance (sitting and/or standing);Decreased safety awareness;Decreased knowledge of use of DME or AE;Decreased knowledge of precautions      OT Treatment/Interventions: Self-care/ADL training;Therapeutic exercise;Neuromuscular education;Energy conservation;DME and/or AE instruction;Therapeutic activities;Balance training;Patient/family education    OT Goals(Current goals can be found in the care plan section) Acute Rehab OT Goals Patient Stated Goal: to figure out what is going on OT Goal Formulation: With patient Time For Goal Achievement: 10/23/21 Potential to Achieve Goals: Good  OT Frequency: Min 2X/week   Barriers to D/C:     patient lives at home alone       Co-evaluation              AM-PAC OT "6 Clicks" Daily Activity     Outcome Measure Help from another person eating meals?: None Help from another person taking care of personal grooming?: A Little Help from another person toileting, which includes using toliet, bedpan, or urinal?: A Little Help from another person bathing (including washing, rinsing, drying)?: A Little Help from another person to put on and taking off regular upper body clothing?: A Little Help from another person to put on and taking off regular lower body clothing?: A Little 6 Click Score: 19   End of Session Nurse Communication: Other (comment) (nurse cleared patient to participate. updated on patients participation in session and possible need to speak with chaplin services later on this date.)  Activity Tolerance: Other (comment) (difficult health news) Patient left: in bed;with call bell/phone within reach  OT Visit Diagnosis: Unsteadiness on feet (R26.81);Muscle weakness (generalized) (M62.81)  Time: 7793-9030 OT Time Calculation (min): 17 min Charges:  OT General Charges $OT Visit: 1 Visit OT Evaluation $OT Eval Low Complexity: 1 Low  Leota Sauers, MS Acute Rehabilitation Department Office# [phone] Pager# [phone]   Marcellina Millin 10/09/2021, 12:25 PM

## 2021-10-09 NOTE — Progress Notes (Signed)
PROGRESS NOTE  Jason Mcguire NKN:[id number] DOB: 23-Dec-1955 DOA: 10/07/2021 PCP: Kathyrn Lass, MD  HPI/Recap of past 24 hours: Jason Mcguire is a 65 y.o. male with medical history significant of asthma, ADHD, presented with SOB, worsening abdominal distension ongoing for about 3 weeks. 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 In the ED, noted to have elevated LFTs, ascites with CT abd/pelvis showing liver lesions concerning for metastatic cancer.  Patient admitted for further management.     Today, patient was very emotional about the possibility of having cancer, reports abdomen is becoming more distended again after having paracentesis, denies any abdominal pain, nausea/vomiting, fever/chills, chest pain or worsening shortness of breath.  Alert, oriented x4.    Assessment/Plan: Principal Problem:   Malignant neoplasm metastatic to liver [facility]) Active Problems:   COPD (chronic obstructive pulmonary disease) (HCC)   Attention deficit disorder with hyperactivity   Anxiety disorder   Ascites, malignant   Weight loss   Anemia   Dehydration   Elevated CK   Elevated lactic acid level   Elevated troponin   Possible malignant liver lesions Elevated LFTs CEA elevated, LDH elevated, AFP pending CT abdomen/pelvis showed diffuse hepatic metastatic disease, noted ascites CT chest noted multiple solid bilateral pulmonary nodules, no suspicious mass to suggest a primary pulmonary malignancy, noted emphysema CT head with no acute intracranial abnormality Discussed with Dr. Lindi Adie on 10/09/2021, recommend MRI liver to rule in primary hepatocellular carcinoma Monitor closely  Ascites Likely due to above Afebrile, with noted leukocytosis UA unremarkable for infection, CXR unremarkable for infection S/p paracentesis on 10/08/2021 yielding about 1.6 L of fluid, no suspicion of infection, cytology pending  Iron deficiency anemia Iron 21, sats 12,  ferritin 1566 Will need oral iron supplementation  Thrombocytosis Likely 2/2 iron deficiency anemia, malignancy Daily CBC  Elevated TSH TSH 12, free T4 0.84 Repeat as an outpatient  ADHD Continue home Adderall  History of anxiety disorder Continue home Xanax as needed     Malnutrition Type:  Nutrition Problem: Increased nutrient needs Etiology: cancer and cancer related treatments   Malnutrition Characteristics:  Signs/Symptoms: estimated needs   Nutrition Interventions:  Interventions: Ensure Enlive (each supplement provides 350kcal and 20 grams of protein)    Estimated body mass index is 19.07 kg/m as calculated from the following:   Height as of this encounter: 5\' 6"  (1.676 m).   Weight as of this encounter: 53.6 kg.     Code Status: Full  Family Communication: None at bedside  Disposition Plan: Status is: Observation  The patient will require care spanning > 2 midnights and should be moved to inpatient because: Level of care     Consultants: None  Procedures: Paracentesis on 10/08/2021  Antimicrobials: None  DVT prophylaxis: Lovenox   Objective: Vitals:   10/08/21 2039 10/08/21 2039 10/09/21 0432 10/09/21 1514  BP: 138/87 138/87 (!) 160/73 (!) 147/89  Pulse: 72 67 76 82  Resp: 16 16 20 16   Temp: 97.7 F (36.5 C) 97.7 F (36.5 C) 98.2 F (36.8 C) 97.6 F (36.4 C)  TempSrc: Oral Oral Oral Oral  SpO2: 95% 96% 94% 95%  Weight:      Height:        Intake/Output Summary (Last 24 hours) at 10/09/2021 1723 Last data filed at 10/09/2021 1330 Gross per 24 hour  Intake 720 ml  Output 650 ml  Net 70 ml   Filed Weights   10/07/21 1710 10/07/21 2358  Weight: 57.2  kg 53.6 kg    Exam: General: NAD, emotional Cardiovascular: S1, S2 present Respiratory: CTAB Abdomen: Soft, nontender, +distended, bowel sounds present Musculoskeletal: Trace bilateral pedal edema noted Skin: Normal Psychiatry: Fair mood     Data  Reviewed: CBC: Recent Labs  Lab 10/07/21 1819 10/08/21 0327 10/09/21 0600  WBC 12.0* 12.3* 13.0*  NEUTROABS 10.0* 10.4*  --   HGB 10.5* 9.8* 10.0*  HCT 34.2* 31.8* 32.5*  MCV 82.2 81.7 82.5  PLT 530* 541* 371*   Basic Metabolic Panel: Recent Labs  Lab 10/07/21 1819 10/08/21 0042 10/08/21 0327 10/09/21 0600  NA 133*  --  134* 136  K 3.7  --  4.2 3.9  CL 93*  --  97* 97*  CO2 28  --  26 27  GLUCOSE 85  --  86 101*  BUN 10  --  11 15  CREATININE 0.65  --  0.56* 0.59*  CALCIUM 8.6*  --  8.7* 8.6*  MG  --  1.9 2.0  --   PHOS  --  3.4 3.6  --    GFR: Estimated Creatinine Clearance: 69.8 mL/min (A) (by C-G formula based on SCr of 0.59 mg/dL (L)). Liver Function Tests: Recent Labs  Lab 10/07/21 1819 10/08/21 0327 10/09/21 0600  AST 88* 83* 62*  ALT 30 27 22   ALKPHOS 418* 341* 382*  BILITOT 0.8 1.2 0.8  PROT 7.5 6.7 6.4*  ALBUMIN 2.6* 2.4* 2.4*   Recent Labs  Lab 10/07/21 1819  LIPASE 20   Recent Labs  Lab 10/07/21 1819  AMMONIA 19   Coagulation Profile: Recent Labs  Lab 10/07/21 1819  INR 1.1   Cardiac Enzymes: Recent Labs  Lab 10/08/21 0042 10/08/21 0327  CKTOTAL 560* 555*   BNP (last 3 results) No results for input(s): PROBNP in the last 8760 hours. HbA1C: No results for input(s): HGBA1C in the last 72 hours. CBG: No results for input(s): GLUCAP in the last 168 hours. Lipid Profile: No results for input(s): CHOL, HDL, LDLCALC, TRIG, CHOLHDL, LDLDIRECT in the last 72 hours. Thyroid Function Tests: Recent Labs    10/08/21 0327  TSH 12.065*  FREET4 0.84   Anemia Panel: Recent Labs    10/08/21 0327  VITAMINB12 1,242*  FOLATE 6.0  FERRITIN 1,566*  TIBC 175*  IRON 21*  RETICCTPCT 1.7   Urine analysis:    Component Value Date/Time   COLORURINE YELLOW 10/07/2021 2010   APPEARANCEUR CLEAR 10/07/2021 2010   LABSPEC >=1.030 10/07/2021 2010   PHURINE 5.5 10/07/2021 2010   GLUCOSEU NEGATIVE 10/07/2021 2010   HGBUR NEGATIVE  10/07/2021 2010   BILIRUBINUR SMALL (A) 10/07/2021 2010   KETONESUR NEGATIVE 10/07/2021 2010   PROTEINUR NEGATIVE 10/07/2021 2010   NITRITE NEGATIVE 10/07/2021 2010   LEUKOCYTESUR NEGATIVE 10/07/2021 2010   Sepsis Labs: @LABRCNTIP (procalcitonin:4,lacticidven:4)  ) Recent Results (from the past 240 hour(s))  Resp Panel by RT-PCR (Flu A&B, Covid) Nasopharyngeal Swab     Status: None   Collection Time: 10/07/21  6:19 PM   Specimen: Nasopharyngeal Swab; Nasopharyngeal(NP) swabs in vial transport medium  Result Value Ref Range Status   SARS Coronavirus 2 by RT PCR NEGATIVE NEGATIVE Final    Comment: (NOTE) SARS-CoV-2 target nucleic acids are NOT DETECTED.  The SARS-CoV-2 RNA is generally detectable in upper respiratory specimens during the acute phase of infection. The lowest concentration of SARS-CoV-2 viral copies this assay can detect is 138 copies/mL. A negative result does not preclude SARS-Cov-2 infection and should not be used as the  sole basis for treatment or other patient management decisions. A negative result may occur with  improper specimen collection/handling, submission of specimen other than nasopharyngeal swab, presence of viral mutation(s) within the areas targeted by this assay, and inadequate number of viral copies(<138 copies/mL). A negative result must be combined with clinical observations, patient history, and epidemiological information. The expected result is Negative.  Fact Sheet for Patients:  EntrepreneurPulse.com.au  Fact Sheet for Healthcare Providers:  IncredibleEmployment.be  This test is no t yet approved or cleared by the [place] FDA and  has been authorized for detection and/or diagnosis of SARS-CoV-2 by FDA under an Emergency Use Authorization (EUA). This EUA will remain  in effect (meaning this test can be used) for the duration of the COVID-19 declaration under Section 564(b)(1) of the Act,  21 U.S.C.section 360bbb-3(b)(1), unless the authorization is terminated  or revoked sooner.       Influenza A by PCR NEGATIVE NEGATIVE Final   Influenza B by PCR NEGATIVE NEGATIVE Final    Comment: (NOTE) The Xpert Xpress SARS-CoV-2/FLU/RSV plus assay is intended as an aid in the diagnosis of influenza from Nasopharyngeal swab specimens and should not be used as a sole basis for treatment. Nasal washings and aspirates are unacceptable for Xpert Xpress SARS-CoV-2/FLU/RSV testing.  Fact Sheet for Patients: EntrepreneurPulse.com.au  Fact Sheet for Healthcare Providers: IncredibleEmployment.be  This test is not yet approved or cleared by the [place] FDA and has been authorized for detection and/or diagnosis of SARS-CoV-2 by FDA under an Emergency Use Authorization (EUA). This EUA will remain in effect (meaning this test can be used) for the duration of the COVID-19 declaration under Section 564(b)(1) of the Act, 21 U.S.C. section 360bbb-3(b)(1), unless the authorization is terminated or revoked.  Performed at [facility], [address] [place], [place] [address]   Urine Culture     Status: None   Collection Time: 10/07/21  8:10 PM   Specimen: Urine, Clean Catch  Result Value Ref Range Status   Specimen Description   Final    URINE, CLEAN CATCH Performed at [facility], [address] [place], [place] [address]    Special Requests   Final    NONE Performed at [facility], [address] [place], [place] [address]    Culture   Final    NO GROWTH Performed at [facility], [place] [address] [place], [place] [address]    Report Status 10/09/2021 FINAL  Final  Body fluid culture w Gram Stain     Status: None (Preliminary result)   Collection Time: 10/08/21 10:38 AM   Specimen: PATH Cytology Peritoneal fluid  Result Value Ref Range Status   Specimen Description   Final     PERITONEAL Performed at [place] [address], [place], [place] [address]    Special Requests   Final    NONE Performed at [facility], [place] [address], [place], [place] [address]    Gram Stain NO WBC SEEN NO ORGANISMS SEEN   Final   Culture   Final    NO GROWTH < 24 HOURS Performed at [facility], [place] [address], [place], [place] [address]    Report Status PENDING  Incomplete      Studies: No results found.  Scheduled Meds:  amphetamine-dextroamphetamine  20 mg Oral BID WC   feeding supplement  237 mL Oral BID BM   sodium  chloride flush  3 mL Intravenous Q12H   [START ON 10/10/2021] thiamine  100 mg Oral Daily    Continuous Infusions:  sodium chloride 250 mL (10/08/21 1112)     LOS: 0 days     Alma Friendly, MD [facility]  If 7PM-7AM, please contact night-coverage [email or web address] 10/09/2021, 5:23 PM

## 2021-10-09 NOTE — Progress Notes (Signed)
PT Cancellation Note  Patient Details Name: Jason Mcguire MRN: [id number] DOB: 20-May-1956   Cancelled Treatment:     PT order received but eval deferred this am at pt request 2* fatigue.  Deferred in pm, pt emotional after being informed of probable CA dx.  Will follow.   Ashna Dorough 10/09/2021, 12:44 PM

## 2021-10-10 DIAGNOSIS — E871 Hypo-osmolality and hyponatremia: Secondary | ICD-10-CM | POA: Diagnosis present

## 2021-10-10 DIAGNOSIS — J432 Centrilobular emphysema: Secondary | ICD-10-CM | POA: Diagnosis present

## 2021-10-10 DIAGNOSIS — R945 Abnormal results of liver function studies: Secondary | ICD-10-CM | POA: Diagnosis not present

## 2021-10-10 DIAGNOSIS — K644 Residual hemorrhoidal skin tags: Secondary | ICD-10-CM | POA: Diagnosis present

## 2021-10-10 DIAGNOSIS — D630 Anemia in neoplastic disease: Secondary | ICD-10-CM | POA: Diagnosis present

## 2021-10-10 DIAGNOSIS — D72829 Elevated white blood cell count, unspecified: Secondary | ICD-10-CM | POA: Diagnosis present

## 2021-10-10 DIAGNOSIS — K659 Peritonitis, unspecified: Secondary | ICD-10-CM | POA: Diagnosis not present

## 2021-10-10 DIAGNOSIS — F419 Anxiety disorder, unspecified: Secondary | ICD-10-CM | POA: Diagnosis present

## 2021-10-10 DIAGNOSIS — C7801 Secondary malignant neoplasm of right lung: Secondary | ICD-10-CM | POA: Diagnosis present

## 2021-10-10 DIAGNOSIS — C19 Malignant neoplasm of rectosigmoid junction: Secondary | ICD-10-CM | POA: Diagnosis present

## 2021-10-10 DIAGNOSIS — Z681 Body mass index (BMI) 19 or less, adult: Secondary | ICD-10-CM | POA: Diagnosis not present

## 2021-10-10 DIAGNOSIS — C20 Malignant neoplasm of rectum: Secondary | ICD-10-CM | POA: Diagnosis not present

## 2021-10-10 DIAGNOSIS — E860 Dehydration: Secondary | ICD-10-CM | POA: Diagnosis present

## 2021-10-10 DIAGNOSIS — R634 Abnormal weight loss: Secondary | ICD-10-CM | POA: Diagnosis not present

## 2021-10-10 DIAGNOSIS — E43 Unspecified severe protein-calorie malnutrition: Secondary | ICD-10-CM | POA: Diagnosis present

## 2021-10-10 DIAGNOSIS — C229 Malignant neoplasm of liver, not specified as primary or secondary: Secondary | ICD-10-CM | POA: Diagnosis not present

## 2021-10-10 DIAGNOSIS — R188 Other ascites: Secondary | ICD-10-CM | POA: Diagnosis not present

## 2021-10-10 DIAGNOSIS — K5730 Diverticulosis of large intestine without perforation or abscess without bleeding: Secondary | ICD-10-CM | POA: Diagnosis present

## 2021-10-10 DIAGNOSIS — C801 Malignant (primary) neoplasm, unspecified: Secondary | ICD-10-CM | POA: Diagnosis not present

## 2021-10-10 DIAGNOSIS — F17210 Nicotine dependence, cigarettes, uncomplicated: Secondary | ICD-10-CM | POA: Diagnosis present

## 2021-10-10 DIAGNOSIS — K921 Melena: Secondary | ICD-10-CM | POA: Diagnosis not present

## 2021-10-10 DIAGNOSIS — Z515 Encounter for palliative care: Secondary | ICD-10-CM | POA: Diagnosis not present

## 2021-10-10 DIAGNOSIS — Z452 Encounter for adjustment and management of vascular access device: Secondary | ICD-10-CM | POA: Diagnosis not present

## 2021-10-10 DIAGNOSIS — K635 Polyp of colon: Secondary | ICD-10-CM | POA: Diagnosis not present

## 2021-10-10 DIAGNOSIS — C787 Secondary malignant neoplasm of liver and intrahepatic bile duct: Secondary | ICD-10-CM | POA: Diagnosis present

## 2021-10-10 DIAGNOSIS — I499 Cardiac arrhythmia, unspecified: Secondary | ICD-10-CM | POA: Diagnosis not present

## 2021-10-10 DIAGNOSIS — K5731 Diverticulosis of large intestine without perforation or abscess with bleeding: Secondary | ICD-10-CM | POA: Diagnosis not present

## 2021-10-10 DIAGNOSIS — C786 Secondary malignant neoplasm of retroperitoneum and peritoneum: Secondary | ICD-10-CM | POA: Diagnosis present

## 2021-10-10 DIAGNOSIS — R932 Abnormal findings on diagnostic imaging of liver and biliary tract: Secondary | ICD-10-CM | POA: Diagnosis not present

## 2021-10-10 DIAGNOSIS — R933 Abnormal findings on diagnostic imaging of other parts of digestive tract: Secondary | ICD-10-CM | POA: Diagnosis not present

## 2021-10-10 DIAGNOSIS — R1084 Generalized abdominal pain: Secondary | ICD-10-CM | POA: Diagnosis present

## 2021-10-10 DIAGNOSIS — D500 Iron deficiency anemia secondary to blood loss (chronic): Secondary | ICD-10-CM | POA: Diagnosis not present

## 2021-10-10 DIAGNOSIS — D75839 Thrombocytosis, unspecified: Secondary | ICD-10-CM | POA: Diagnosis present

## 2021-10-10 DIAGNOSIS — R180 Malignant ascites: Secondary | ICD-10-CM | POA: Diagnosis present

## 2021-10-10 DIAGNOSIS — C218 Malignant neoplasm of overlapping sites of rectum, anus and anal canal: Secondary | ICD-10-CM | POA: Diagnosis not present

## 2021-10-10 DIAGNOSIS — Z20822 Contact with and (suspected) exposure to covid-19: Secondary | ICD-10-CM | POA: Diagnosis present

## 2021-10-10 DIAGNOSIS — I248 Other forms of acute ischemic heart disease: Secondary | ICD-10-CM | POA: Diagnosis present

## 2021-10-10 DIAGNOSIS — J439 Emphysema, unspecified: Secondary | ICD-10-CM | POA: Diagnosis not present

## 2021-10-10 DIAGNOSIS — D509 Iron deficiency anemia, unspecified: Secondary | ICD-10-CM | POA: Diagnosis present

## 2021-10-10 DIAGNOSIS — G893 Neoplasm related pain (acute) (chronic): Secondary | ICD-10-CM | POA: Diagnosis present

## 2021-10-10 DIAGNOSIS — K7689 Other specified diseases of liver: Secondary | ICD-10-CM | POA: Diagnosis not present

## 2021-10-10 DIAGNOSIS — C7802 Secondary malignant neoplasm of left lung: Secondary | ICD-10-CM | POA: Diagnosis present

## 2021-10-10 DIAGNOSIS — F909 Attention-deficit hyperactivity disorder, unspecified type: Secondary | ICD-10-CM | POA: Diagnosis present

## 2021-10-10 LAB — CBC WITH DIFFERENTIAL/PLATELET
Abs Immature Granulocytes: 0.06 10*3/uL (ref 0.00–0.07)
Basophils Absolute: 0.1 10*3/uL (ref 0.0–0.1)
Basophils Relative: 0 %
Eosinophils Absolute: 0.1 10*3/uL (ref 0.0–0.5)
Eosinophils Relative: 1 %
HCT: 33.6 % — ABNORMAL LOW (ref 39.0–52.0)
Hemoglobin: 10.5 g/dL — ABNORMAL LOW (ref 13.0–17.0)
Immature Granulocytes: 1 %
Lymphocytes Relative: 8 %
Lymphs Abs: 1 10*3/uL (ref 0.7–4.0)
MCH: 25.4 pg — ABNORMAL LOW (ref 26.0–34.0)
MCHC: 31.3 g/dL (ref 30.0–36.0)
MCV: 81.4 fL (ref 80.0–100.0)
Monocytes Absolute: 0.8 10*3/uL (ref 0.1–1.0)
Monocytes Relative: 7 %
Neutro Abs: 10.8 10*3/uL — ABNORMAL HIGH (ref 1.7–7.7)
Neutrophils Relative %: 83 %
Platelets: 532 10*3/uL — ABNORMAL HIGH (ref 150–400)
RBC: 4.13 MIL/uL — ABNORMAL LOW (ref 4.22–5.81)
RDW: 18 % — ABNORMAL HIGH (ref 11.5–15.5)
WBC: 12.8 10*3/uL — ABNORMAL HIGH (ref 4.0–10.5)
nRBC: 0 % (ref 0.0–0.2)

## 2021-10-10 LAB — COMPREHENSIVE METABOLIC PANEL
ALT: 24 U/L (ref 0–44)
AST: 76 U/L — ABNORMAL HIGH (ref 15–41)
Albumin: 2.4 g/dL — ABNORMAL LOW (ref 3.5–5.0)
Alkaline Phosphatase: 431 U/L — ABNORMAL HIGH (ref 38–126)
Anion gap: 9 (ref 5–15)
BUN: 13 mg/dL (ref 8–23)
CO2: 28 mmol/L (ref 22–32)
Calcium: 8.2 mg/dL — ABNORMAL LOW (ref 8.9–10.3)
Chloride: 98 mmol/L (ref 98–111)
Creatinine, Ser: 0.6 mg/dL — ABNORMAL LOW (ref 0.61–1.24)
GFR, Estimated: >60 mL/min (ref >60)
Glucose, Bld: 92 mg/dL (ref 70–99)
Potassium: 3.9 mmol/L (ref 3.5–5.1)
Sodium: 135 mmol/L (ref 135–145)
Total Bilirubin: 0.7 mg/dL (ref 0.3–1.2)
Total Protein: 6.3 g/dL — ABNORMAL LOW (ref 6.5–8.1)

## 2021-10-10 MED ORDER — HYDROMORPHONE HCL 1 MG/ML IJ SOLN
1.0000 mg | INTRAMUSCULAR | Status: DC | PRN
  Administered 2021-10-10 – 2021-10-13 (×17): 1 mg via INTRAVENOUS
  Filled 2021-10-10 (×17): qty 1

## 2021-10-10 MED ORDER — HYDRALAZINE HCL 20 MG/ML IJ SOLN: 10.0000 mg | Freq: Three times a day (TID) | INTRAMUSCULAR | Status: DC | PRN
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
## 2021-10-10 NOTE — Progress Notes (Addendum)
PROGRESS NOTE  DOSS CYBULSKI LOV:[id number] DOB: [birth year] DOA: 10/07/2021 PCP: Kathyrn Lass, MD  HPI/Recap of past 24 hours: Jason Mcguire is a 65 y.o. male with medical history significant of asthma, ADHD, presented with SOB, worsening abdominal distension ongoing for about 3 weeks. 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 In the ED, noted to have elevated LFTs, ascites with CT abd/pelvis showing liver lesions concerning for metastatic cancer.  Patient admitted for further management.     Today, patient noted to be in significant pain, moaning in pain, unable to get comfortable in bed, reports pain is worse in back, and abdomen feels uncomfortable due to ascites.  Patient denies any chest pain, nausea/vomiting, fever/chills, shortness of breath.    Assessment/Plan: Principal Problem:   Malignant neoplasm metastatic to liver [facility]) Active Problems:   COPD (chronic obstructive pulmonary disease) (HCC)   Attention deficit disorder with hyperactivity   Anxiety disorder   Ascites, malignant   Weight loss   Anemia   Dehydration   Elevated CK   Elevated lactic acid level   Elevated troponin   Malignant neoplasm metastatic to liver with unknown primary site [facility])   Possible malignant liver lesions Elevated LFTs CEA elevated, LDH elevated, AFP pending CT abdomen/pelvis showed diffuse hepatic metastatic disease, noted ascites CT chest noted multiple solid bilateral pulmonary nodules, no suspicious mass to suggest a primary pulmonary malignancy, noted emphysema CT head with no acute intracranial abnormality Discussed with Dr. Lindi Adie on 10/09/2021, recommend MRI liver to rule in primary hepatocellular carcinoma MRI showed innumerable bilobar hepatic lesions consistent with metastatic disease, recommend direct tissue sampling.  Noted large volume ascites Paracentesis and liver biopsy both scheduled for 10/12/2021 by IR Pain management with IV  Dilaudid, p.o. Norco Monitor closely  Ascites Likely due to above Afebrile, with noted leukocytosis UA unremarkable for infection, CXR unremarkable for infection S/p paracentesis on 10/08/2021 yielding about 1.6 L of fluid, no suspicion of infection, cytology pending  Iron deficiency anemia Iron 21, sats 12, ferritin 1566 Will need oral iron supplementation  Thrombocytosis Likely 2/2 iron deficiency anemia, malignancy Daily CBC  Elevated TSH TSH 12, free T4 0.84 Repeat as an outpatient  ADHD Continue home Adderall  History of anxiety disorder Continue home Xanax as needed     Malnutrition Type:  Nutrition Problem: Increased nutrient needs Etiology: cancer and cancer related treatments   Malnutrition Characteristics:  Signs/Symptoms: estimated needs   Nutrition Interventions:  Interventions: Ensure Enlive (each supplement provides 350kcal and 20 grams of protein)    Estimated body mass index is 19.07 kg/m as calculated from the following:   Height as of this encounter: 5\' 6"  (1.676 m).   Weight as of this encounter: 53.6 kg.     Code Status: Full  Family Communication: None at bedside  Disposition Plan: Status is: Inpatient  The patient will require care spanning > 2 midnights and should be moved to inpatient because: Level of care     Consultants: None  Procedures: Paracentesis on 10/08/2021  Antimicrobials: None  DVT prophylaxis: Lovenox   Objective: Vitals:   10/09/21 0432 10/09/21 1514 10/09/21 2044 10/10/21 0505  BP: (!) 160/73 (!) 147/89 (!) 157/105 (!) 186/92  Pulse: 76 82 77 81  Resp: 20 16 16 18   Temp: 98.2 F (36.8 C) 97.6 F (36.4 C) 97.8 F (36.6 C) 97.7 F (36.5 C)  TempSrc: Oral Oral Oral Oral  SpO2: 94% 95% 95% 96%  Weight:  Height:        Intake/Output Summary (Last 24 hours) at 10/10/2021 1330 Last data filed at 10/10/2021 0750 Gross per 24 hour  Intake --  Output 1050 ml  Net -1050 ml   Filed  Weights   10/07/21 1710 10/07/21 2358  Weight: 57.2 kg 53.6 kg    Exam: General: NAD, moaning in pain, cachectic, chronically ill-appearing Cardiovascular: S1, S2 present Respiratory: CTAB Abdomen: Soft, nontender, +distended, bowel sounds present Musculoskeletal: Trace bilateral pedal edema noted Skin: Normal Psychiatry: Poor mood     Data Reviewed: CBC: Recent Labs  Lab 10/07/21 1819 10/08/21 0327 10/09/21 0600 10/10/21 0639  WBC 12.0* 12.3* 13.0* 12.8*  NEUTROABS 10.0* 10.4*  --  10.8*  HGB 10.5* 9.8* 10.0* 10.5*  HCT 34.2* 31.8* 32.5* 33.6*  MCV 82.2 81.7 82.5 81.4  PLT 530* 541* 530* 025*   Basic Metabolic Panel: Recent Labs  Lab 10/07/21 1819 10/08/21 0042 10/08/21 0327 10/09/21 0600 10/10/21 0639  NA 133*  --  134* 136 135  K 3.7  --  4.2 3.9 3.9  CL 93*  --  97* 97* 98  CO2 28  --  26 27 28   GLUCOSE 85  --  86 101* 92  BUN 10  --  11 15 13   CREATININE 0.65  --  0.56* 0.59* 0.60*  CALCIUM 8.6*  --  8.7* 8.6* 8.2*  MG  --  1.9 2.0  --   --   PHOS  --  3.4 3.6  --   --    GFR: Estimated Creatinine Clearance: 69.8 mL/min (A) (by C-G formula based on SCr of 0.6 mg/dL (L)). Liver Function Tests: Recent Labs  Lab 10/07/21 1819 10/08/21 0327 10/09/21 0600 10/10/21 0639  AST 88* 83* 62* 76*  ALT 30 27 22 24   ALKPHOS 418* 341* 382* 431*  BILITOT 0.8 1.2 0.8 0.7  PROT 7.5 6.7 6.4* 6.3*  ALBUMIN 2.6* 2.4* 2.4* 2.4*   Recent Labs  Lab 10/07/21 1819  LIPASE 20   Recent Labs  Lab 10/07/21 1819  AMMONIA 19   Coagulation Profile: Recent Labs  Lab 10/07/21 1819  INR 1.1   Cardiac Enzymes: Recent Labs  Lab 10/08/21 0042 10/08/21 0327  CKTOTAL 560* 555*   BNP (last 3 results) No results for input(s): PROBNP in the last 8760 hours. HbA1C: No results for input(s): HGBA1C in the last 72 hours. CBG: No results for input(s): GLUCAP in the last 168 hours. Lipid Profile: No results for input(s): CHOL, HDL, LDLCALC, TRIG, CHOLHDL, LDLDIRECT  in the last 72 hours. Thyroid Function Tests: Recent Labs    10/08/21 0327  TSH 12.065*  FREET4 0.84   Anemia Panel: Recent Labs    10/08/21 0327  VITAMINB12 1,242*  FOLATE 6.0  FERRITIN 1,566*  TIBC 175*  IRON 21*  RETICCTPCT 1.7   Urine analysis:    Component Value Date/Time   COLORURINE YELLOW 10/07/2021 2010   APPEARANCEUR CLEAR 10/07/2021 2010   LABSPEC >=1.030 10/07/2021 2010   PHURINE 5.5 10/07/2021 2010   GLUCOSEU NEGATIVE 10/07/2021 2010   HGBUR NEGATIVE 10/07/2021 2010   BILIRUBINUR SMALL (A) 10/07/2021 2010   KETONESUR NEGATIVE 10/07/2021 2010   PROTEINUR NEGATIVE 10/07/2021 2010   NITRITE NEGATIVE 10/07/2021 2010   LEUKOCYTESUR NEGATIVE 10/07/2021 2010   Sepsis Labs: @LABRCNTIP (procalcitonin:4,lacticidven:4)  ) Recent Results (from the past 240 hour(s))  Resp Panel by RT-PCR (Flu A&B, Covid) Nasopharyngeal Swab     Status: None   Collection Time: 10/07/21  6:19  PM   Specimen: Nasopharyngeal Swab; Nasopharyngeal(NP) swabs in vial transport medium  Result Value Ref Range Status   SARS Coronavirus 2 by RT PCR NEGATIVE NEGATIVE Final    Comment: (NOTE) SARS-CoV-2 target nucleic acids are NOT DETECTED.  The SARS-CoV-2 RNA is generally detectable in upper respiratory specimens during the acute phase of infection. The lowest concentration of SARS-CoV-2 viral copies this assay can detect is 138 copies/mL. A negative result does not preclude SARS-Cov-2 infection and should not be used as the sole basis for treatment or other patient management decisions. A negative result may occur with  improper specimen collection/handling, submission of specimen other than nasopharyngeal swab, presence of viral mutation(s) within the areas targeted by this assay, and inadequate number of viral copies(<138 copies/mL). A negative result must be combined with clinical observations, patient history, and epidemiological information. The expected result is Negative.  Fact  Sheet for Patients:  EntrepreneurPulse.com.au  Fact Sheet for Healthcare Providers:  IncredibleEmployment.be  This test is no t yet approved or cleared by the [place] FDA and  has been authorized for detection and/or diagnosis of SARS-CoV-2 by FDA under an Emergency Use Authorization (EUA). This EUA will remain  in effect (meaning this test can be used) for the duration of the COVID-19 declaration under Section 564(b)(1) of the Act, 21 U.S.C.section 360bbb-3(b)(1), unless the authorization is terminated  or revoked sooner.       Influenza A by PCR NEGATIVE NEGATIVE Final   Influenza B by PCR NEGATIVE NEGATIVE Final    Comment: (NOTE) The Xpert Xpress SARS-CoV-2/FLU/RSV plus assay is intended as an aid in the diagnosis of influenza from Nasopharyngeal swab specimens and should not be used as a sole basis for treatment. Nasal washings and aspirates are unacceptable for Xpert Xpress SARS-CoV-2/FLU/RSV testing.  Fact Sheet for Patients: EntrepreneurPulse.com.au  Fact Sheet for Healthcare Providers: IncredibleEmployment.be  This test is not yet approved or cleared by the [place] FDA and has been authorized for detection and/or diagnosis of SARS-CoV-2 by FDA under an Emergency Use Authorization (EUA). This EUA will remain in effect (meaning this test can be used) for the duration of the COVID-19 declaration under Section 564(b)(1) of the Act, 21 U.S.C. section 360bbb-3(b)(1), unless the authorization is terminated or revoked.  Performed at [facility], [address], [place], [place] [address]   Urine Culture     Status: None   Collection Time: 10/07/21  8:10 PM   Specimen: Urine, Clean Catch  Result Value Ref Range Status   Specimen Description   Final    URINE, CLEAN CATCH Performed at [facility], [address] [place], [place] [address]    Special Requests    Final    NONE Performed at [facility], [address] [place], [place] [address]    Culture   Final    NO GROWTH Performed at [facility], [place] [address] [place], [place] [address]    Report Status 10/09/2021 FINAL  Final  Body fluid culture w Gram Stain     Status: None (Preliminary result)   Collection Time: 10/08/21 10:38 AM   Specimen: PATH Cytology Peritoneal fluid  Result Value Ref Range Status   Specimen Description   Final    PERITONEAL Performed at [place] [address] [place], [place] [address]    Special Requests   Final    NONE Performed at [facility], [place]  [address] [place], [place] [address]    Gram Stain NO WBC SEEN NO ORGANISMS SEEN   Final   Culture   Final    NO GROWTH 2 DAYS Performed at [facility], [place] [address], [place], [place] [address]    Report Status PENDING  Incomplete      Studies: MR LIVER W WO CONTRAST  Result Date: 10/09/2021 CLINICAL DATA:  Evaluation of hepatic lesions, hepatocellular carcinoma. EXAM: MRI ABDOMEN WITHOUT AND WITH CONTRAST TECHNIQUE: Multiplanar multisequence MR imaging of the abdomen was performed both before and after the administration of intravenous contrast. CONTRAST:  19mL GADAVIST GADOBUTROL 1 MMOL/ML IV SOLN COMPARISON:  CT abdomen pelvis October 07, 2021 and chest CT October 08, 2021. FINDINGS: Despite efforts by the technologist and patient, motion artifact is present on today's exam and could not be eliminated. This reduces exam sensitivity and specificity. Lower chest: No acute abnormality. Hepatobiliary: Liver is enlarged with innumerable bilobar hepatic lesions, predominantly these demonstrate mildly hyperintense T2 signal with a peripheral rim of enhancement. For reference. There is a lesion in the posteromedial aspect of the right lobe of the liver measuring 3.8 x 3.1 cm on image 36/17 and a lesion in the posteromedial aspect of the  left lobe of the liver which measures 15 x 13 mm on image 38/32. Pericholecystic fluid without gallbladder wall thickening or cholelithiasis is favored sequela of underlying hepatocellular disease. No biliary ductal dilation. Pancreas: Pancreas appears atrophic and poorly evaluated without pancreatic ductal dilation. Spleen:  Within normal limits in size and appearance. Adrenals/Urinary Tract: The bilateral adrenal glands appear unremarkable. No hydronephrosis. No solid enhancing renal mass. Stomach/Bowel: No acute abnormality on this limited evaluation. Vascular/Lymphatic: The portal, splenic and superior mesenteric veins appear patent. Abdominal adenopathy for instance a periportal lymph node measuring 16 mm in short axis on image 66/9. Other:  Large volume abdominal ascites. Musculoskeletal: No suspicious bone lesions identified. IMPRESSION: Despite efforts by the technologist and patient, motion artifact is present on today's exam and could not be eliminated. This reduces exam sensitivity and specificity. 1. Innumerable bilobar hepatic lesions, with imaging features most consistent with metastatic disease. Consider further evaluation with direct tissue sampling for more definitive characterization. 2. Abdominal adenopathy and large volume ascites, likely reflecting disease involvement. Electronically Signed   By: Dahlia Bailiff M.D.   On: 10/09/2021 19:35    Scheduled Meds:  amphetamine-dextroamphetamine  20 mg Oral BID WC   enoxaparin (LOVENOX) injection  40 mg Subcutaneous Q24H   feeding supplement  237 mL Oral BID BM   sodium chloride flush  3 mL Intravenous Q12H   thiamine  100 mg Oral Daily    Continuous Infusions:  sodium chloride 250 mL (10/08/21 1112)     LOS: 0 days     Alma Friendly, MD [facility]  If 7PM-7AM, please contact night-coverage [email or web address] 10/10/2021, 1:30 PM

## 2021-10-11 ENCOUNTER — Inpatient Hospital Stay (HOSPITAL_COMMUNITY): Payer: PPO

## 2021-10-11 DIAGNOSIS — F909 Attention-deficit hyperactivity disorder, unspecified type: Secondary | ICD-10-CM | POA: Diagnosis not present

## 2021-10-11 DIAGNOSIS — D509 Iron deficiency anemia, unspecified: Secondary | ICD-10-CM | POA: Diagnosis not present

## 2021-10-11 DIAGNOSIS — C787 Secondary malignant neoplasm of liver and intrahepatic bile duct: Secondary | ICD-10-CM | POA: Diagnosis not present

## 2021-10-11 DIAGNOSIS — R180 Malignant ascites: Secondary | ICD-10-CM | POA: Diagnosis not present

## 2021-10-11 LAB — CBC WITH DIFFERENTIAL/PLATELET
Abs Immature Granulocytes: 0.08 10*3/uL — ABNORMAL HIGH (ref 0.00–0.07)
Basophils Absolute: 0.1 10*3/uL (ref 0.0–0.1)
Basophils Relative: 0 %
Eosinophils Absolute: 0.1 10*3/uL (ref 0.0–0.5)
Eosinophils Relative: 0 %
HCT: 37.9 % — ABNORMAL LOW (ref 39.0–52.0)
Hemoglobin: 11.7 g/dL — ABNORMAL LOW (ref 13.0–17.0)
Immature Granulocytes: 1 %
Lymphocytes Relative: 7 %
Lymphs Abs: 1.1 10*3/uL (ref 0.7–4.0)
MCH: 25.3 pg — ABNORMAL LOW (ref 26.0–34.0)
MCHC: 30.9 g/dL (ref 30.0–36.0)
MCV: 82 fL (ref 80.0–100.0)
Monocytes Absolute: 1.2 10*3/uL — ABNORMAL HIGH (ref 0.1–1.0)
Monocytes Relative: 7 %
Neutro Abs: 13.3 10*3/uL — ABNORMAL HIGH (ref 1.7–7.7)
Neutrophils Relative %: 85 %
Platelets: 513 10*3/uL — ABNORMAL HIGH (ref 150–400)
RBC: 4.62 MIL/uL (ref 4.22–5.81)
RDW: 18.3 % — ABNORMAL HIGH (ref 11.5–15.5)
WBC: 15.7 10*3/uL — ABNORMAL HIGH (ref 4.0–10.5)
nRBC: 0 % (ref 0.0–0.2)

## 2021-10-11 LAB — COMPREHENSIVE METABOLIC PANEL
ALT: 27 U/L (ref 0–44)
AST: 89 U/L — ABNORMAL HIGH (ref 15–41)
Albumin: 2.4 g/dL — ABNORMAL LOW (ref 3.5–5.0)
Alkaline Phosphatase: 406 U/L — ABNORMAL HIGH (ref 38–126)
Anion gap: 11 (ref 5–15)
BUN: 11 mg/dL (ref 8–23)
CO2: 26 mmol/L (ref 22–32)
Calcium: 8.4 mg/dL — ABNORMAL LOW (ref 8.9–10.3)
Chloride: 98 mmol/L (ref 98–111)
Creatinine, Ser: 0.57 mg/dL — ABNORMAL LOW (ref 0.61–1.24)
GFR, Estimated: >60 mL/min (ref >60)
Glucose, Bld: 90 mg/dL (ref 70–99)
Potassium: 3.9 mmol/L (ref 3.5–5.1)
Sodium: 135 mmol/L (ref 135–145)
Total Bilirubin: 0.8 mg/dL (ref 0.3–1.2)
Total Protein: 6.5 g/dL (ref 6.5–8.1)

## 2021-10-11 LAB — URINALYSIS, ROUTINE W REFLEX MICROSCOPIC
Glucose, UA: NEGATIVE mg/dL
Hgb urine dipstick: NEGATIVE
Ketones, ur: NEGATIVE mg/dL
Leukocytes,Ua: NEGATIVE
Nitrite: NEGATIVE
Protein, ur: 30 mg/dL — AB
Specific Gravity, Urine: 1.03 (ref 1.005–1.030)
pH: 6 (ref 5.0–8.0)

## 2021-10-11 LAB — BODY FLUID CULTURE W GRAM STAIN
Culture: NO GROWTH
Gram Stain: NONE SEEN

## 2021-10-11 LAB — AFP TUMOR MARKER: AFP, Serum, Tumor Marker: <1.8 ng/mL (ref 0.0–8.4)

## 2021-10-11 LAB — PROCALCITONIN: Procalcitonin: 2.36 ng/mL

## 2021-10-11 LAB — LACTIC ACID, PLASMA
Lactic Acid, Venous: 1.9 mmol/L (ref 0.5–1.9)
Lactic Acid, Venous: 2 mmol/L (ref 0.5–1.9)

## 2021-10-11 NOTE — Progress Notes (Signed)
CRITICAL VALUE STICKER  CRITICAL VALUE: Lactic 2.0  RECEIVER (on-site recipient of call): Deanna Artis RN   DATE & TIME NOTIFIED:  10/11/2021 AT 0957  MESSENGER (representative from lab):  MD NOTIFIED:  [place] messaged  TIME OF NOTIFICATION: 10/11/2021 0957 RESPONSE:  Awaiting

## 2021-10-11 NOTE — Plan of Care (Signed)
°  Problem: Clinical Measurements: Goal: Diagnostic test results will improve Outcome: Progressing   Problem: Activity: Goal: Risk for activity intolerance will decrease Outcome: Progressing   Problem: Nutrition: Goal: Adequate nutrition will be maintained Outcome: Progressing   

## 2021-10-11 NOTE — Progress Notes (Signed)
PT Cancellation Note  Patient Details Name: TAGGART PRASAD MRN: [id number] DOB: Sep 21, 1956   Cancelled Treatment:    Reason Eval/Treat Not Completed: Medical issues which prohibited therapy;Patient declined, does not feel  like moving. Has biopsy and tharacentesis planned 12/27. Will check back 12/28.   Claretha Cooper 10/11/2021, 3:14 PM Kevin Pager [phone] Office [phone]

## 2021-10-11 NOTE — Progress Notes (Signed)
PROGRESS NOTE  Jason Mcguire SWN:[id number] DOB: 1956/07/24 DOA: 10/07/2021 PCP: Kathyrn Lass, MD  HPI/Recap of past 24 hours: Jason Mcguire is a 65 y.o. male with medical history significant of asthma, ADHD, presented with SOB, worsening abdominal distension ongoing for about 3 weeks. 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 In the ED, noted to have elevated LFTs, ascites with CT abd/pelvis showing liver lesions concerning for metastatic cancer.  Patient admitted for further management.     Today, patient still with abdominal pain, generalized, but denies its worsening.  Patient denies any chest pain, worsening shortness of breath, nausea/vomiting, fever/chills.  Noted to have poor appetite.    Assessment/Plan: Principal Problem:   Malignant neoplasm metastatic to liver [facility]) Active Problems:   COPD (chronic obstructive pulmonary disease) (HCC)   Attention deficit disorder with hyperactivity   Anxiety disorder   Ascites, malignant   Weight loss   Anemia   Dehydration   Elevated CK   Elevated lactic acid level   Elevated troponin   Malignant neoplasm metastatic to liver with unknown primary site [facility])   Possible malignant liver lesions Elevated LFTs CEA elevated, LDH elevated, AFP < 1.8 CT abdomen/pelvis showed diffuse hepatic metastatic disease, noted ascites CT chest noted multiple solid bilateral pulmonary nodules, no suspicious mass to suggest a primary pulmonary malignancy, noted emphysema CT head with no acute intracranial abnormality Discussed with Dr. Lindi Adie on 10/09/2021, recommend MRI liver to rule in primary hepatocellular carcinoma MRI liver showed innumerable bilobar hepatic lesions consistent with metastatic disease, recommend direct tissue sampling.  Noted large volume ascites Paracentesis and liver biopsy both scheduled for 10/12/2021 by IR Pain management with IV Dilaudid, p.o. Norco Monitor  closely  Ascites Likely due to above S/p paracentesis on 10/08/2021 yielding about 1.6 L of fluid, no suspicion of infection, cytology pending, fluid culture with no growth  Leukocytosis Afebrile, with noted leukocytosis Initial UA unremarkable for infection, initial CXR unremarkable for infection Repeat UA unremarkable for infection, repeat chest x-ray unremarkable for infection BC x2, UC pending Lactic acid minimally elevated to 2 Procalcitonin elevated at 2.36, will trend Low threshold to start antibiotics, but will wait for repeat paracentesis to further evaluate Monitor closely, daily CBC  Iron deficiency anemia Iron 21, sats 12, ferritin 1566 Will need oral iron supplementation Daily CBC  Thrombocytosis Likely 2/2 iron deficiency anemia, malignancy Daily CBC  Elevated TSH TSH 12, free T4 0.84 Repeat as an outpatient  ADHD Continue home Adderall  History of anxiety disorder Continue home Xanax as needed     Malnutrition Type:  Nutrition Problem: Increased nutrient needs Etiology: cancer and cancer related treatments   Malnutrition Characteristics:  Signs/Symptoms: estimated needs   Nutrition Interventions:  Interventions: Ensure Enlive (each supplement provides 350kcal and 20 grams of protein)    Estimated body mass index is 19.07 kg/m as calculated from the following:   Height as of this encounter: 5\' 6"  (1.676 m).   Weight as of this encounter: 53.6 kg.     Code Status: Full  Family Communication: None at bedside  Disposition Plan: Status is: Inpatient  The patient will require care spanning > 2 midnights and should be moved to inpatient because: Level of care     Consultants: IR  Procedures: Paracentesis on 10/08/2021  Antimicrobials: None  DVT prophylaxis: Lovenox- held for procedure   Objective: Vitals:   10/10/21 1347 10/10/21 2056 10/11/21 0423 10/11/21 1147  BP: (!) 152/85 (!) 169/78 (!) 171/109 Marland Kitchen)  144/83  Pulse: 79  87 88 92  Resp: 18 16 18 16   Temp: 97.8 F (36.6 C) 97.9 F (36.6 C) (!) 97.5 F (36.4 C) (!) 97.5 F (36.4 C)  TempSrc: Oral Oral Oral Axillary  SpO2: 100% 100% 98% 96%  Weight:      Height:        Intake/Output Summary (Last 24 hours) at 10/11/2021 1521 Last data filed at 10/11/2021 1400 Gross per 24 hour  Intake 474.91 ml  Output 500 ml  Net -25.09 ml   Filed Weights   10/07/21 1710 10/07/21 2358  Weight: 57.2 kg 53.6 kg    Exam: General: NAD, cachectic, chronically ill-appearing Cardiovascular: S1, S2 present Respiratory: CTAB Abdomen: Soft, +tender, +distended, bowel sounds present Musculoskeletal: Trace bilateral pedal edema noted Skin: Normal Psychiatry: Poor mood     Data Reviewed: CBC: Recent Labs  Lab 10/07/21 1819 10/08/21 0327 10/09/21 0600 10/10/21 0639 10/11/21 0536  WBC 12.0* 12.3* 13.0* 12.8* 15.7*  NEUTROABS 10.0* 10.4*  --  10.8* 13.3*  HGB 10.5* 9.8* 10.0* 10.5* 11.7*  HCT 34.2* 31.8* 32.5* 33.6* 37.9*  MCV 82.2 81.7 82.5 81.4 82.0  PLT 530* 541* 530* 532* 300*   Basic Metabolic Panel: Recent Labs  Lab 10/07/21 1819 10/08/21 0042 10/08/21 0327 10/09/21 0600 10/10/21 0639 10/11/21 0536  NA 133*  --  134* 136 135 135  K 3.7  --  4.2 3.9 3.9 3.9  CL 93*  --  97* 97* 98 98  CO2 28  --  26 27 28 26   GLUCOSE 85  --  86 101* 92 90  BUN 10  --  11 15 13 11   CREATININE 0.65  --  0.56* 0.59* 0.60* 0.57*  CALCIUM 8.6*  --  8.7* 8.6* 8.2* 8.4*  MG  --  1.9 2.0  --   --   --   PHOS  --  3.4 3.6  --   --   --    GFR: Estimated Creatinine Clearance: 69.8 mL/min (A) (by C-G formula based on SCr of 0.57 mg/dL (L)). Liver Function Tests: Recent Labs  Lab 10/07/21 1819 10/08/21 0327 10/09/21 0600 10/10/21 0639 10/11/21 0536  AST 88* 83* 62* 76* 89*  ALT 30 27 22 24 27   ALKPHOS 418* 341* 382* 431* 406*  BILITOT 0.8 1.2 0.8 0.7 0.8  PROT 7.5 6.7 6.4* 6.3* 6.5  ALBUMIN 2.6* 2.4* 2.4* 2.4* 2.4*   Recent Labs  Lab 10/07/21 1819   LIPASE 20   Recent Labs  Lab 10/07/21 1819  AMMONIA 19   Coagulation Profile: Recent Labs  Lab 10/07/21 1819  INR 1.1   Cardiac Enzymes: Recent Labs  Lab 10/08/21 0042 10/08/21 0327  CKTOTAL 560* 555*   BNP (last 3 results) No results for input(s): PROBNP in the last 8760 hours. HbA1C: No results for input(s): HGBA1C in the last 72 hours. CBG: No results for input(s): GLUCAP in the last 168 hours. Lipid Profile: No results for input(s): CHOL, HDL, LDLCALC, TRIG, CHOLHDL, LDLDIRECT in the last 72 hours. Thyroid Function Tests: No results for input(s): TSH, T4TOTAL, FREET4, T3FREE, THYROIDAB in the last 72 hours.  Anemia Panel: No results for input(s): VITAMINB12, FOLATE, FERRITIN, TIBC, IRON, RETICCTPCT in the last 72 hours.  Urine analysis:    Component Value Date/Time   COLORURINE AMBER (A) 10/11/2021 1110   APPEARANCEUR HAZY (A) 10/11/2021 1110   LABSPEC 1.030 10/11/2021 1110   PHURINE 6.0 10/11/2021 1110   GLUCOSEU NEGATIVE 10/11/2021 1110  HGBUR NEGATIVE 10/11/2021 1110   BILIRUBINUR SMALL (A) 10/11/2021 1110   KETONESUR NEGATIVE 10/11/2021 1110   PROTEINUR 30 (A) 10/11/2021 1110   NITRITE NEGATIVE 10/11/2021 1110   LEUKOCYTESUR NEGATIVE 10/11/2021 1110   Sepsis Labs: @LABRCNTIP (procalcitonin:4,lacticidven:4)  ) Recent Results (from the past 240 hour(s))  Resp Panel by RT-PCR (Flu A&B, Covid) Nasopharyngeal Swab     Status: None   Collection Time: 10/07/21  6:19 PM   Specimen: Nasopharyngeal Swab; Nasopharyngeal(NP) swabs in vial transport medium  Result Value Ref Range Status   SARS Coronavirus 2 by RT PCR NEGATIVE NEGATIVE Final    Comment: (NOTE) SARS-CoV-2 target nucleic acids are NOT DETECTED.  The SARS-CoV-2 RNA is generally detectable in upper respiratory specimens during the acute phase of infection. The lowest concentration of SARS-CoV-2 viral copies this assay can detect is 138 copies/mL. A negative result does not preclude  SARS-Cov-2 infection and should not be used as the sole basis for treatment or other patient management decisions. A negative result may occur with  improper specimen collection/handling, submission of specimen other than nasopharyngeal swab, presence of viral mutation(s) within the areas targeted by this assay, and inadequate number of viral copies(<138 copies/mL). A negative result must be combined with clinical observations, patient history, and epidemiological information. The expected result is Negative.  Fact Sheet for Patients:  EntrepreneurPulse.com.au  Fact Sheet for Healthcare Providers:  IncredibleEmployment.be  This test is no t yet approved or cleared by the [place] FDA and  has been authorized for detection and/or diagnosis of SARS-CoV-2 by FDA under an Emergency Use Authorization (EUA). This EUA will remain  in effect (meaning this test can be used) for the duration of the COVID-19 declaration under Section 564(b)(1) of the Act, 21 U.S.C.section 360bbb-3(b)(1), unless the authorization is terminated  or revoked sooner.       Influenza A by PCR NEGATIVE NEGATIVE Final   Influenza B by PCR NEGATIVE NEGATIVE Final    Comment: (NOTE) The Xpert Xpress SARS-CoV-2/FLU/RSV plus assay is intended as an aid in the diagnosis of influenza from Nasopharyngeal swab specimens and should not be used as a sole basis for treatment. Nasal washings and aspirates are unacceptable for Xpert Xpress SARS-CoV-2/FLU/RSV testing.  Fact Sheet for Patients: EntrepreneurPulse.com.au  Fact Sheet for Healthcare Providers: IncredibleEmployment.be  This test is not yet approved or cleared by the [place] FDA and has been authorized for detection and/or diagnosis of SARS-CoV-2 by FDA under an Emergency Use Authorization (EUA). This EUA will remain in effect (meaning this test can be used) for the duration of  the COVID-19 declaration under Section 564(b)(1) of the Act, 21 U.S.C. section 360bbb-3(b)(1), unless the authorization is terminated or revoked.  Performed at [facility], [address] [place], [place] [address]   Urine Culture     Status: None   Collection Time: 10/07/21  8:10 PM   Specimen: Urine, Clean Catch  Result Value Ref Range Status   Specimen Description   Final    URINE, CLEAN CATCH Performed at [facility], [address] [place], [place] [address]    Special Requests   Final    NONE Performed at [facility], [address] [place], [place] [address]    Culture   Final    NO GROWTH Performed at [facility], [place] [address], [place], [place] [address]    Report Status 10/09/2021 FINAL  Final  Body fluid culture w Gram  Stain     Status: None   Collection Time: 10/08/21 10:38 AM   Specimen: PATH Cytology Peritoneal fluid  Result Value Ref Range Status   Specimen Description   Final    PERITONEAL Performed at [facility] [address], [place], [place] [address]    Special Requests   Final    NONE Performed at [facility], [place] [address] [place], [place] [address]    Gram Stain NO WBC SEEN NO ORGANISMS SEEN   Final   Culture   Final    NO GROWTH 3 DAYS Performed at [facility], [place] [address], [place], [place] [address]    Report Status 10/11/2021 FINAL  Final      Studies: DG Chest Port 1 View  Result Date: 10/11/2021 CLINICAL DATA:  65 year old male with arrhythmia, leukocytosis. EXAM: PORTABLE CHEST 1 VIEW COMPARISON:  Chest CT 10/08/2021 and earlier. FINDINGS: Portable AP semi upright view at 0709 hours. Mildly lower lung volumes with evidence of chronic pulmonary hyperinflation on the recent CT demonstrating emphysema. Mediastinal contours remain within normal limits. Calcified aortic atherosclerosis. Visualized tracheal air column is within normal limits.  Allowing for portable technique the lungs are clear. No pneumothorax or pleural effusion. Mild chronic lung base scarring. Paucity of bowel gas in the upper abdomen. No acute osseous abnormality identified. IMPRESSION: Emphysema (ICD10-J43.9). No acute cardiopulmonary abnormality. Electronically Signed   By: Genevie Ann M.D.   On: 10/11/2021 07:34    Scheduled Meds:  amphetamine-dextroamphetamine  20 mg Oral BID WC   enoxaparin (LOVENOX) injection  40 mg Subcutaneous Q24H   feeding supplement  237 mL Oral BID BM   sodium chloride flush  3 mL Intravenous Q12H   thiamine  100 mg Oral Daily    Continuous Infusions:  sodium chloride 250 mL (10/08/21 1112)     LOS: 1 day     Alma Friendly, MD [facility]  If 7PM-7AM, please contact night-coverage [email or web address] 10/11/2021, 3:21 PM

## 2021-10-11 NOTE — Progress Notes (Signed)
Patient approved for percutaneous liver lesion biopsy with paracentesis to be performed at same time to allow for optimal visualization of lesion per Dr. Pascal Lux.  Tentatively scheduled for 10/12/21 pending any emergent IR procedures which would take precedence. This can also be scheduled as an outpatient if the patient is planned for discharge - please let IR know if this is the case so we can setup outpatient appointment.  Patient to be NPO at midnight, hold anticoagulation, AM labs ordered.  Candiss Norse, PA-C

## 2021-10-12 ENCOUNTER — Encounter (HOSPITAL_COMMUNITY): Payer: Self-pay | Admitting: Internal Medicine

## 2021-10-12 ENCOUNTER — Inpatient Hospital Stay (HOSPITAL_COMMUNITY): Payer: PPO

## 2021-10-12 DIAGNOSIS — D509 Iron deficiency anemia, unspecified: Secondary | ICD-10-CM | POA: Diagnosis not present

## 2021-10-12 DIAGNOSIS — C787 Secondary malignant neoplasm of liver and intrahepatic bile duct: Secondary | ICD-10-CM | POA: Diagnosis not present

## 2021-10-12 DIAGNOSIS — F909 Attention-deficit hyperactivity disorder, unspecified type: Secondary | ICD-10-CM | POA: Diagnosis not present

## 2021-10-12 DIAGNOSIS — R180 Malignant ascites: Secondary | ICD-10-CM | POA: Diagnosis not present

## 2021-10-12 LAB — CBC WITH DIFFERENTIAL/PLATELET
Abs Immature Granulocytes: 0.05 10*3/uL (ref 0.00–0.07)
Basophils Absolute: 0 10*3/uL (ref 0.0–0.1)
Basophils Relative: 0 %
Eosinophils Absolute: 0.1 10*3/uL (ref 0.0–0.5)
Eosinophils Relative: 0 %
HCT: 35.6 % — ABNORMAL LOW (ref 39.0–52.0)
Hemoglobin: 11 g/dL — ABNORMAL LOW (ref 13.0–17.0)
Immature Granulocytes: 0 %
Lymphocytes Relative: 8 %
Lymphs Abs: 1.1 10*3/uL (ref 0.7–4.0)
MCH: 25.2 pg — ABNORMAL LOW (ref 26.0–34.0)
MCHC: 30.9 g/dL (ref 30.0–36.0)
MCV: 81.7 fL (ref 80.0–100.0)
Monocytes Absolute: 0.8 10*3/uL (ref 0.1–1.0)
Monocytes Relative: 6 %
Neutro Abs: 12.5 10*3/uL — ABNORMAL HIGH (ref 1.7–7.7)
Neutrophils Relative %: 86 %
Platelets: 543 10*3/uL — ABNORMAL HIGH (ref 150–400)
RBC: 4.36 MIL/uL (ref 4.22–5.81)
RDW: 18.7 % — ABNORMAL HIGH (ref 11.5–15.5)
WBC: 14.6 10*3/uL — ABNORMAL HIGH (ref 4.0–10.5)
nRBC: 0 % (ref 0.0–0.2)

## 2021-10-12 LAB — BODY FLUID CELL COUNT WITH DIFFERENTIAL
Eos, Fluid: 1 %
Lymphs, Fluid: 22 %
Monocyte-Macrophage-Serous Fluid: 66 % (ref 50–90)
Neutrophil Count, Fluid: 11 % (ref 0–25)
Total Nucleated Cell Count, Fluid: 145 cu mm (ref 0–1000)

## 2021-10-12 LAB — COMPREHENSIVE METABOLIC PANEL
ALT: 61 U/L — ABNORMAL HIGH (ref 0–44)
AST: 329 U/L — ABNORMAL HIGH (ref 15–41)
Albumin: 2.5 g/dL — ABNORMAL LOW (ref 3.5–5.0)
Alkaline Phosphatase: 414 U/L — ABNORMAL HIGH (ref 38–126)
Anion gap: 10 (ref 5–15)
BUN: 14 mg/dL (ref 8–23)
CO2: 27 mmol/L (ref 22–32)
Calcium: 8.6 mg/dL — ABNORMAL LOW (ref 8.9–10.3)
Chloride: 98 mmol/L (ref 98–111)
Creatinine, Ser: 0.54 mg/dL — ABNORMAL LOW (ref 0.61–1.24)
GFR, Estimated: >60 mL/min (ref >60)
Glucose, Bld: 89 mg/dL (ref 70–99)
Potassium: 4.5 mmol/L (ref 3.5–5.1)
Sodium: 135 mmol/L (ref 135–145)
Total Bilirubin: 1.1 mg/dL (ref 0.3–1.2)
Total Protein: 6.8 g/dL (ref 6.5–8.1)

## 2021-10-12 LAB — URINE CULTURE: Culture: <10000 — AB

## 2021-10-12 LAB — CYTOLOGY - NON PAP

## 2021-10-12 LAB — PROTIME-INR
INR: 1.2 (ref 0.8–1.2)
Prothrombin Time: 14.8 seconds (ref 11.4–15.2)

## 2021-10-12 LAB — PROCALCITONIN: Procalcitonin: 2.52 ng/mL

## 2021-10-12 MED ORDER — MIDAZOLAM HCL 2 MG/2ML IJ SOLN
INTRAMUSCULAR | Status: AC
  Filled 2021-10-12: qty 4

## 2021-10-12 MED ORDER — FENTANYL CITRATE (PF) 100 MCG/2ML IJ SOLN
INTRAMUSCULAR | Status: AC | PRN
  Administered 2021-10-12 (×2): 50 ug via INTRAVENOUS

## 2021-10-12 MED ORDER — FENTANYL CITRATE (PF) 100 MCG/2ML IJ SOLN
INTRAMUSCULAR | Status: AC
  Filled 2021-10-12: qty 2

## 2021-10-12 MED ORDER — GELATIN ABSORBABLE 12-7 MM EX MISC
CUTANEOUS | Status: AC
  Administered 2021-10-12: 1
  Filled 2021-10-12: qty 1

## 2021-10-12 MED ORDER — LIDOCAINE HCL (PF) 1 % IJ SOLN
INTRAMUSCULAR | Status: AC | PRN
  Administered 2021-10-12: 10 mL
  Administered 2021-10-12: 5 mL

## 2021-10-12 MED ORDER — LIDOCAINE HCL 1 % IJ SOLN
INTRAMUSCULAR | Status: AC
  Filled 2021-10-12: qty 40

## 2021-10-12 MED ORDER — MIDAZOLAM HCL 2 MG/2ML IJ SOLN
INTRAMUSCULAR | Status: AC | PRN
  Administered 2021-10-12 (×2): 1 mg via INTRAVENOUS

## 2021-10-12 NOTE — Procedures (Signed)
Interventional Radiology Procedure:   Indications: Metastatic disease workup    Procedure: US guided paracentesis and liver lesion biopsy  Findings: 1.9 L of amber fluid removed from RLQ.  4 core biopsies obtained from left hepatic lesion.   Complications: No immediate complications noted.     EBL: Minimal  Plan: Bedrest 3 hours   Jouri Threat R. Anselm Pancoast, MD  Pager: [phone]

## 2021-10-12 NOTE — Progress Notes (Signed)
OT Cancellation Note  Patient Details Name: JAIVEN GRAVELINE MRN: [id number] DOB: [birth year]   Cancelled Treatment:    Reason Eval/Treat Not Completed: Patient at procedure or test/ unavailable Patient off floor for paracentesis and liver lesion biopsy on this date. OT to continue to follow and check back as able.  Jackelyn Poling OTR/L, [facility] Office# [phone] Pager# [phone]  10/12/2021, 2:28 PM

## 2021-10-12 NOTE — Plan of Care (Signed)
°  Problem: Health Behavior/Discharge Planning: Goal: Ability to manage health-related needs will improve Outcome: Progressing   Problem: Clinical Measurements: Goal: Will remain free from infection Outcome: Progressing Goal: Diagnostic test results will improve Outcome: Progressing Goal: Respiratory complications will improve Outcome: Progressing   Problem: Activity: Goal: Risk for activity intolerance will decrease 10/12/2021 2053 by Yevonne Pax, RN Outcome: Progressing 10/12/2021 2052 by Yevonne Pax, RN Outcome: Progressing   Problem: Nutrition: Goal: Adequate nutrition will be maintained Outcome: Progressing

## 2021-10-12 NOTE — Progress Notes (Signed)
Off unit to ultrasound.

## 2021-10-12 NOTE — Progress Notes (Signed)
PROGRESS NOTE  Jason Mcguire GSJ:[id number] DOB: Feb 15, 1956 DOA: 10/07/2021 PCP: Sigmund Hazel, MD  HPI/Recap of past 24 hours: Jason Mcguire is a 65 y.o. male with medical history significant of asthma, ADHD, presented with SOB, worsening abdominal distension ongoing for about 3 weeks. 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 In the ED, noted to have elevated LFTs, ascites with CT abd/pelvis showing liver lesions concerning for metastatic cancer.  Patient admitted for further management.     Today, met patient moaning, in tears due to distended abdominal pain, and rectal pain. Denied any other new complaints.    Assessment/Plan: Principal Problem:   Malignant neoplasm metastatic to liver [facility]) Active Problems:   COPD (chronic obstructive pulmonary disease) (HCC)   Attention deficit disorder with hyperactivity   Anxiety disorder   Ascites, malignant   Weight loss   Anemia   Dehydration   Elevated CK   Elevated lactic acid level   Elevated troponin   Malignant neoplasm metastatic to liver with unknown primary site [facility])   Possible malignant liver lesions Elevated LFTs CEA elevated, LDH elevated, AFP < 1.8 CT abdomen/pelvis showed diffuse hepatic metastatic disease, noted ascites CT chest noted multiple solid bilateral pulmonary nodules, no suspicious mass to suggest a primary pulmonary malignancy, noted emphysema CT head with no acute intracranial abnormality Discussed with Dr. Pamelia Hoit on 10/09/2021, recommend MRI liver to rule in primary hepatocellular carcinoma MRI liver showed innumerable bilobar hepatic lesions consistent with metastatic disease, recommend direct tissue sampling S/P Liver biopsy on 10/12/2021 by IR Pain management with IV Dilaudid, p.o. Norco Monitor closely  Recurrent Ascites Likely due to above S/p paracentesis on 10/08/2021 yielding about 1.6 L of fluid, no suspicion of infection, cytology pending, fluid  culture with no growth Repeat paracentesis done on 10/12/21 yielding about 1.9 L of fluid, cultures pending Monitor closely  Leukocytosis Afebrile, with noted leukocytosis Initial UA unremarkable for infection, initial CXR unremarkable for infection Repeat UA unremarkable for infection, repeat chest x-ray unremarkable for infection BC x2 NGTD, UC with insignificant growth Lactic acid minimally elevated to 2 Procalcitonin elevated at 2.36-->2.52 Low threshold to start antibiotics, but will wait for repeat peritoneal fluid analysis to further evaluate Monitor closely, daily CBC  Iron deficiency anemia Iron 21, sats 12, ferritin 1566 Will need oral iron supplementation Daily CBC  Thrombocytosis Likely 2/2 iron deficiency anemia, malignancy Daily CBC  Elevated TSH TSH 12, free T4 0.84 Repeat as an outpatient  ADHD Continue home Adderall  History of anxiety disorder Continue home Xanax as needed  GOC discussion Pt with poor prognosis, possibly metastatic CA, malnutrition/cachetic Palliative consulted     Malnutrition Type:  Nutrition Problem: Increased nutrient needs Etiology: cancer and cancer related treatments   Malnutrition Characteristics:  Signs/Symptoms: estimated needs   Nutrition Interventions:  Interventions: Ensure Enlive (each supplement provides 350kcal and 20 grams of protein)    Estimated body mass index is 19.07 kg/m as calculated from the following:   Height as of this encounter: 5\' 6"  (1.676 m).   Weight as of this encounter: 53.6 kg.     Code Status: Full  Family Communication: None at bedside  Disposition Plan: Status is: Inpatient  The patient will require care spanning > 2 midnights and should be moved to inpatient because: Level of care     Consultants: IR  Procedures: Paracentesis on 10/08/2021 and on 10/12/21  Antimicrobials: None  DVT prophylaxis: Lovenox- held for procedure   Objective: Vitals:  10/12/21  1435 10/12/21 1440 10/12/21 1445 10/12/21 1450  BP: (!) 172/101 (!) 172/89 (!) 141/99 (!) 156/92  Pulse: 88 95 93 92  Resp: $Remo'14 12 12 16  'Uolez$ Temp:      TempSrc:      SpO2: 100% 100% 99% 99%  Weight:      Height:        Intake/Output Summary (Last 24 hours) at 10/12/2021 1508 Last data filed at 10/12/2021 1345 Gross per 24 hour  Intake 3 ml  Output 150 ml  Net -147 ml   Filed Weights   10/07/21 1710 10/07/21 2358  Weight: 57.2 kg 53.6 kg    Exam: General: NAD, cachectic, chronically ill-appearing Cardiovascular: S1, S2 present Respiratory: CTAB Abdomen: Soft, +tender, +distended, bowel sounds present Musculoskeletal: Trace bilateral pedal edema noted Skin: Normal Psychiatry: Poor mood     Data Reviewed: CBC: Recent Labs  Lab 10/07/21 1819 10/08/21 0327 10/09/21 0600 10/10/21 0639 10/11/21 0536 10/12/21 0520  WBC 12.0* 12.3* 13.0* 12.8* 15.7* 14.6*  NEUTROABS 10.0* 10.4*  --  10.8* 13.3* 12.5*  HGB 10.5* 9.8* 10.0* 10.5* 11.7* 11.0*  HCT 34.2* 31.8* 32.5* 33.6* 37.9* 35.6*  MCV 82.2 81.7 82.5 81.4 82.0 81.7  PLT 530* 541* 530* 532* 513* 929*   Basic Metabolic Panel: Recent Labs  Lab 10/08/21 0042 10/08/21 0327 10/09/21 0600 10/10/21 0639 10/11/21 0536 10/12/21 0520  NA  --  134* 136 135 135 135  K  --  4.2 3.9 3.9 3.9 4.5  CL  --  97* 97* 98 98 98  CO2  --  $R'26 27 28 26 27  'Fz$ GLUCOSE  --  86 101* 92 90 89  BUN  --  $R'11 15 13 11 14  'uU$ CREATININE  --  0.56* 0.59* 0.60* 0.57* 0.54*  CALCIUM  --  8.7* 8.6* 8.2* 8.4* 8.6*  MG 1.9 2.0  --   --   --   --   PHOS 3.4 3.6  --   --   --   --    GFR: Estimated Creatinine Clearance: 69.8 mL/min (A) (by C-G formula based on SCr of 0.54 mg/dL (L)). Liver Function Tests: Recent Labs  Lab 10/08/21 0327 10/09/21 0600 10/10/21 0639 10/11/21 0536 10/12/21 0520  AST 83* 62* 76* 89* 329*  ALT $Re'27 22 24 27 'cNe$ 61*  ALKPHOS 341* 382* 431* 406* 414*  BILITOT 1.2 0.8 0.7 0.8 1.1  PROT 6.7 6.4* 6.3* 6.5 6.8  ALBUMIN 2.4*  2.4* 2.4* 2.4* 2.5*   Recent Labs  Lab 10/07/21 1819  LIPASE 20   Recent Labs  Lab 10/07/21 1819  AMMONIA 19   Coagulation Profile: Recent Labs  Lab 10/07/21 1819 10/12/21 0520  INR 1.1 1.2   Cardiac Enzymes: Recent Labs  Lab 10/08/21 0042 10/08/21 0327  CKTOTAL 560* 555*   BNP (last 3 results) No results for input(s): PROBNP in the last 8760 hours. HbA1C: No results for input(s): HGBA1C in the last 72 hours. CBG: No results for input(s): GLUCAP in the last 168 hours. Lipid Profile: No results for input(s): CHOL, HDL, LDLCALC, TRIG, CHOLHDL, LDLDIRECT in the last 72 hours. Thyroid Function Tests: No results for input(s): TSH, T4TOTAL, FREET4, T3FREE, THYROIDAB in the last 72 hours.  Anemia Panel: No results for input(s): VITAMINB12, FOLATE, FERRITIN, TIBC, IRON, RETICCTPCT in the last 72 hours.  Urine analysis:    Component Value Date/Time   COLORURINE AMBER (A) 10/11/2021 1110   APPEARANCEUR HAZY (A) 10/11/2021 1110   LABSPEC 1.030 10/11/2021  1110   PHURINE 6.0 10/11/2021 1110   GLUCOSEU NEGATIVE 10/11/2021 1110   HGBUR NEGATIVE 10/11/2021 1110   BILIRUBINUR SMALL (A) 10/11/2021 1110   KETONESUR NEGATIVE 10/11/2021 1110   PROTEINUR 30 (A) 10/11/2021 1110   NITRITE NEGATIVE 10/11/2021 1110   LEUKOCYTESUR NEGATIVE 10/11/2021 1110   Sepsis Labs: $RemoveBefo'@[id number]$ (procalcitonin:4,lacticidven:4)  ) Recent Results (from the past 240 hour(s))  Resp Panel by RT-PCR (Flu A&B, Covid) Nasopharyngeal Swab     Status: None   Collection Time: 10/07/21  6:19 PM   Specimen: Nasopharyngeal Swab; Nasopharyngeal(NP) swabs in vial transport medium  Result Value Ref Range Status   SARS Coronavirus 2 by RT PCR NEGATIVE NEGATIVE Final    Comment: (NOTE) SARS-CoV-2 target nucleic acids are NOT DETECTED.  The SARS-CoV-2 RNA is generally detectable in upper respiratory specimens during the acute phase of infection. The lowest concentration of SARS-CoV-2 viral copies this assay  can detect is 138 copies/mL. A negative result does not preclude SARS-Cov-2 infection and should not be used as the sole basis for treatment or other patient management decisions. A negative result may occur with  improper specimen collection/handling, submission of specimen other than nasopharyngeal swab, presence of viral mutation(s) within the areas targeted by this assay, and inadequate number of viral copies(<138 copies/mL). A negative result must be combined with clinical observations, patient history, and epidemiological information. The expected result is Negative.  Fact Sheet for Patients:  EntrepreneurPulse.com.au  Fact Sheet for Healthcare Providers:  IncredibleEmployment.be  This test is no t yet approved or cleared by the [place] FDA and  has been authorized for detection and/or diagnosis of SARS-CoV-2 by FDA under an Emergency Use Authorization (EUA). This EUA will remain  in effect (meaning this test can be used) for the duration of the COVID-19 declaration under Section 564(b)(1) of the Act, 21 U.S.C.section 360bbb-3(b)(1), unless the authorization is terminated  or revoked sooner.       Influenza A by PCR NEGATIVE NEGATIVE Final   Influenza B by PCR NEGATIVE NEGATIVE Final    Comment: (NOTE) The Xpert Xpress SARS-CoV-2/FLU/RSV plus assay is intended as an aid in the diagnosis of influenza from Nasopharyngeal swab specimens and should not be used as a sole basis for treatment. Nasal washings and aspirates are unacceptable for Xpert Xpress SARS-CoV-2/FLU/RSV testing.  Fact Sheet for Patients: EntrepreneurPulse.com.au  Fact Sheet for Healthcare Providers: IncredibleEmployment.be  This test is not yet approved or cleared by the [place] FDA and has been authorized for detection and/or diagnosis of SARS-CoV-2 by FDA under an Emergency Use Authorization (EUA). This EUA will  remain in effect (meaning this test can be used) for the duration of the COVID-19 declaration under Section 564(b)(1) of the Act, 21 U.S.C. section 360bbb-3(b)(1), unless the authorization is terminated or revoked.  Performed at [facility], [address] [place], [place] [address]   Urine Culture     Status: None   Collection Time: 10/07/21  8:10 PM   Specimen: Urine, Clean Catch  Result Value Ref Range Status   Specimen Description   Final    URINE, CLEAN CATCH Performed at [place], [address] [place], [place] [address]    Special Requests   Final    NONE Performed at [facility], [address] [place], [place] [address]    Culture   Final    NO GROWTH Performed at [facility], [place] [address], [place], [place] [address]  Report Status 10/09/2021 FINAL  Final  Body fluid culture w Gram Stain     Status: None   Collection Time: 10/08/21 10:38 AM   Specimen: PATH Cytology Peritoneal fluid  Result Value Ref Range Status   Specimen Description   Final    PERITONEAL Performed at [facility] [place] [address], [place], [place] [address]    Special Requests   Final    NONE Performed at [facility], [place] [address] [place], [place] [address]    Gram Stain NO WBC SEEN NO ORGANISMS SEEN   Final   Culture   Final    NO GROWTH 3 DAYS Performed at [facility], [place] [address] [place], [place] [address]    Report Status 10/11/2021 FINAL  Final  Culture, blood (routine x 2)     Status: None (Preliminary result)   Collection Time: 10/11/21  8:12 AM   Specimen: BLOOD  Result Value Ref Range Status   Specimen Description   Final    BLOOD BLOOD RIGHT HAND Performed at [place] [address], [place], [place] [address]    Special Requests   Final    BOTTLES DRAWN AEROBIC ONLY Blood Culture adequate volume Performed at [facility] [address], [place], [place] [address]    Culture   Final    NO GROWTH < 24 HOURS Performed at [place] [address], [place], [place] [address]    Report Status PENDING  Incomplete  Culture, blood (routine x 2)     Status: None (Preliminary result)   Collection Time: 10/11/21  8:15 AM   Specimen: BLOOD  Result Value Ref Range Status   Specimen Description   Final    BLOOD RIGHT ANTECUBITAL Performed at [facility] [address] [place], [place] [address]    Special Requests   Final    BOTTLES DRAWN AEROBIC ONLY Blood Culture adequate volume Performed at [facility] [address], [place], [place] [address]    Culture   Final    NO GROWTH < 24 HOURS Performed at [facility] [address] [place], [place] [address]    Report Status PENDING  Incomplete  Urine Culture     Status: Abnormal   Collection Time: 10/11/21 11:10 AM   Specimen: Urine, Clean Catch  Result Value Ref Range Status   Specimen Description   Final    URINE, CLEAN CATCH Performed at [facility], [place] [address] [place], [place] [address]    Special Requests   Final    NONE Performed at [facility], [place] [address], [place], [place] [address]    Culture (A)  Final    <10,000 COLONIES/mL INSIGNIFICANT GROWTH Performed at [place] [facility] [address] [place], [place] [address]    Report Status 10/12/2021 FINAL  Final      Studies: No results found.  Scheduled Meds:  amphetamine-dextroamphetamine  20 mg Oral BID WC   feeding supplement  237 mL Oral BID BM   fentaNYL       midazolam       sodium chloride flush  3 mL Intravenous Q12H   thiamine  100 mg Oral Daily    Continuous Infusions:  sodium chloride 250 mL (10/08/21 1112)     LOS: 2 days     Alma Friendly, MD [facility]  If 7PM-7AM,  please contact night-coverage [email or web address] 10/12/2021, 3:08 PM

## 2021-10-12 NOTE — Plan of Care (Signed)
  Problem: Activity: Goal: Risk for activity intolerance will decrease Outcome: Progressing   

## 2021-10-13 DIAGNOSIS — G893 Neoplasm related pain (acute) (chronic): Secondary | ICD-10-CM

## 2021-10-13 DIAGNOSIS — C787 Secondary malignant neoplasm of liver and intrahepatic bile duct: Secondary | ICD-10-CM | POA: Diagnosis not present

## 2021-10-13 DIAGNOSIS — F419 Anxiety disorder, unspecified: Secondary | ICD-10-CM

## 2021-10-13 DIAGNOSIS — Z515 Encounter for palliative care: Secondary | ICD-10-CM

## 2021-10-13 LAB — CBC WITH DIFFERENTIAL/PLATELET
Abs Immature Granulocytes: 0.05 10*3/uL (ref 0.00–0.07)
Basophils Absolute: 0 10*3/uL (ref 0.0–0.1)
Basophils Relative: 0 %
Eosinophils Absolute: 0 10*3/uL (ref 0.0–0.5)
Eosinophils Relative: 0 %
HCT: 33.7 % — ABNORMAL LOW (ref 39.0–52.0)
Hemoglobin: 10.5 g/dL — ABNORMAL LOW (ref 13.0–17.0)
Immature Granulocytes: 0 %
Lymphocytes Relative: 7 %
Lymphs Abs: 0.9 10*3/uL (ref 0.7–4.0)
MCH: 25.7 pg — ABNORMAL LOW (ref 26.0–34.0)
MCHC: 31.2 g/dL (ref 30.0–36.0)
MCV: 82.4 fL (ref 80.0–100.0)
Monocytes Absolute: 0.6 10*3/uL (ref 0.1–1.0)
Monocytes Relative: 5 %
Neutro Abs: 11.1 10*3/uL — ABNORMAL HIGH (ref 1.7–7.7)
Neutrophils Relative %: 88 %
Platelets: 333 10*3/uL (ref 150–400)
RBC: 4.09 MIL/uL — ABNORMAL LOW (ref 4.22–5.81)
RDW: 19.5 % — ABNORMAL HIGH (ref 11.5–15.5)
WBC: 12.7 10*3/uL — ABNORMAL HIGH (ref 4.0–10.5)
nRBC: 0 % (ref 0.0–0.2)

## 2021-10-13 LAB — COMPREHENSIVE METABOLIC PANEL
ALT: 65 U/L — ABNORMAL HIGH (ref 0–44)
AST: 323 U/L — ABNORMAL HIGH (ref 15–41)
Albumin: 2.3 g/dL — ABNORMAL LOW (ref 3.5–5.0)
Alkaline Phosphatase: 457 U/L — ABNORMAL HIGH (ref 38–126)
Anion gap: 8 (ref 5–15)
BUN: 16 mg/dL (ref 8–23)
CO2: 25 mmol/L (ref 22–32)
Calcium: 8.2 mg/dL — ABNORMAL LOW (ref 8.9–10.3)
Chloride: 97 mmol/L — ABNORMAL LOW (ref 98–111)
Creatinine, Ser: 0.69 mg/dL (ref 0.61–1.24)
GFR, Estimated: >60 mL/min (ref >60)
Glucose, Bld: 115 mg/dL — ABNORMAL HIGH (ref 70–99)
Potassium: 4.6 mmol/L (ref 3.5–5.1)
Sodium: 130 mmol/L — ABNORMAL LOW (ref 135–145)
Total Bilirubin: 1 mg/dL (ref 0.3–1.2)
Total Protein: 6.3 g/dL — ABNORMAL LOW (ref 6.5–8.1)

## 2021-10-13 LAB — PATHOLOGIST SMEAR REVIEW

## 2021-10-13 LAB — PROCALCITONIN: Procalcitonin: 6.96 ng/mL

## 2021-10-13 LAB — HEMOGLOBIN AND HEMATOCRIT, BLOOD
HCT: 33.9 % — ABNORMAL LOW (ref 39.0–52.0)
Hemoglobin: 10.6 g/dL — ABNORMAL LOW (ref 13.0–17.0)

## 2021-10-13 MED ORDER — HYDROMORPHONE HCL 1 MG/ML IJ SOLN
1.0000 mg | INTRAMUSCULAR | Status: DC | PRN
  Administered 2021-10-13 – 2021-10-14 (×6): 1 mg via INTRAVENOUS
  Filled 2021-10-13 (×6): qty 1

## 2021-10-13 MED ORDER — SENNOSIDES-DOCUSATE SODIUM 8.6-50 MG PO TABS
2.0000 | ORAL_TABLET | Freq: Two times a day (BID) | ORAL | Status: DC
  Administered 2021-10-13 – 2021-10-15 (×3): 2 via ORAL
  Filled 2021-10-13 (×3): qty 2

## 2021-10-13 MED ORDER — ALUM & MAG HYDROXIDE-SIMETH 200-200-20 MG/5ML PO SUSP: 30.0000 mL | Freq: Four times a day (QID) | ORAL | Status: DC | PRN

## 2021-10-13 MED ORDER — OXYCODONE HCL ER 20 MG PO T12A
20.0000 mg | EXTENDED_RELEASE_TABLET | Freq: Two times a day (BID) | ORAL | Status: DC
  Administered 2021-10-13 – 2021-10-14 (×2): 20 mg via ORAL
  Filled 2021-10-13 (×2): qty 1

## 2021-10-13 NOTE — Progress Notes (Signed)
PROGRESS NOTE    Jason Mcguire  EHM:[id number] DOB: [birth year] DOA: 10/07/2021 PCP: Kathyrn Lass, MD   Chief Complaint  Patient presents with   Abdominal Pain   Shortness of Breath  Brief Narrative/Hospital Course: Jason Mcguire, 65 y.o. male with PMH of asthma, ADHD, presented with SOB, worsening abdominal distension ongoing for about 3 weeks PTA.Hx of heavy alcohol abuse when he was younger, currently drinks socially. In the ED, noted to have elevated LFTs, ascites with CT abd/pelvis showing liver lesions concerning for metastatic cancer.   Patient was admitted underwent further work-up.  He had paracentesis on 12/23 that showed no signs of infection.Underwent image guided liver biopsy and repeat paracentesis 12/27   Subjective: Seen and examined this morning.  C/o abd pain, low appetites. Sisters in room Reports since his last doc visit  2 months ago he had cold snap and felts he was having carbon monoxide problems he was not feeling well, losing weight x 2 months Abdomen distended this morning.  Dilaudid and oral pain medication helping  Assessment & Plan:   Abnormal LFTs Diffuse innumerable bilobar hepatic lesion concerning for metastatic disease Abdominal adenopathy and large volume ascites Multiple solid bilateral pulmonary nodules largest 5 mm-concerning for metastatic disease :Initial paracentesis 12/23 no signs of infection, cytology-nonmalignant cell and no organism.  Underwent repeat paracentesis and liver biopsy in 12/27.  Follow-up biopsy results  Recurrent ascites in the setting of liver mass concern for malignancy, follow-up biopsy result symptomatic management.  Centrilobular emphysema noted in the CT chest  Leukocytosis so far no evidence of infection on UA chest x-ray and initial paracentesis, blood culture negative x2 although procalcitonin elevated, watch for any signs of infection.  Monitor Recent Labs  Lab 10/07/21 1938 10/08/21 0042 10/08/21 0327  10/09/21 0600 10/10/21 3662 10/11/21 0536 10/11/21 0813 10/11/21 0906 10/12/21 0520 10/13/21 0537  WBC  --   --  12.3* 13.0* 12.8* 15.7*  --   --  14.6* 12.7*  LATICACIDVEN 2.5* 2.4* 1.8  --   --   --  1.9 2.0*  --   --   PROCALCITON  --   --   --   --   --   --  2.36  --  2.52 6.96     Iron-deficiency anemia likely from malignancy monitor CBC  Elevated TSH with normal free T4 outpatient follow-up in 3 weeks  Hyponatremia monitor  Anxiety disorder ADHD: Moderately stable, continue home Adderall and Xanax  Goals of care: Chaplain consulted sister at the bedside, Perative care is on board.  Nutrition Problem: Increased nutrient needs Etiology: cancer and cancer related treatments Signs/Symptoms: estimated needs Interventions: Ensure Enlive (each supplement provides 350kcal and 20 grams of protein)   DVT prophylaxis: SCDs Start: 10/08/21 0031.  Lovenox held for biopsy resume hopefully soon once okay with IR Code Status:   Code Status: Full Code Family Communication: plan of care discussed with patient and 2 of his sisters at bedside. Status is: Inpatient Remains inpatient appropriate because: For ongoing management of abdominal pain ascites and biopsy result Disposition: Currently not medically stable for discharge. Anticipated Disposition: TBD   Objective: Vitals last 24 hrs: Vitals:   10/12/21 1602 10/12/21 1632 10/12/21 1739 10/13/21 0439  BP: (!) 165/87 (!) 161/85 (!) 158/96 (!) 151/71  Pulse: 87 (!) 104 (!) 46 92  Resp:    18  Temp:    98.3 F (36.8 C)  TempSrc:    Oral  SpO2: 97% 98% 96% 93%  Weight:      Height:       Weight change:   Intake/Output Summary (Last 24 hours) at 10/13/2021 1013 Last data filed at 10/13/2021 6213 Gross per 24 hour  Intake 243 ml  Output 575 ml  Net -332 ml   Net IO Since Admission: -1,162.09 mL [10/13/21 1013]   Physical Examination: General exam: Aa0x3, thin frail cachectic, weak,older than stated age. HEENT:Oral  mucosa moist, Ear/Nose WNL grossly,dentition normal. Respiratory system: B/l diminished BS, no use of accessory muscle, non tender. Cardiovascular system: S1 & S2 +,No JVD. Gastrointestinal system: Abdomen soft, mildly distended tender generalized, BS+. Nervous System:Alert, awake, moving extremities. Extremities: edema none, distal peripheral pulses palpable.  Skin: No rashes, no icterus. MSK: Normal muscle bulk, tone, power.  Medications reviewed:  Scheduled Meds:  amphetamine-dextroamphetamine  20 mg Oral BID WC   feeding supplement  237 mL Oral BID BM   sodium chloride flush  3 mL Intravenous Q12H   thiamine  100 mg Oral Daily   Continuous Infusions:  sodium chloride 250 mL (10/08/21 1112)    Diet Order             Diet 2 gram sodium Room service appropriate? Yes; Fluid consistency: Thin  Diet effective now                   Nutrition Problem: Increased nutrient needs Etiology: cancer and cancer related treatments Signs/Symptoms: estimated needs Interventions: Ensure Enlive (each supplement provides 350kcal and 20 grams of protein)  Weight change:   Wt Readings from Last 3 Encounters:  10/07/21 53.6 kg  01/25/19 59 kg  06/20/13 61.2 kg     Consultants:see note  Procedures:see note Antimicrobials: Anti-infectives (From admission, onward)    None      Culture/Microbiology    Component Value Date/Time   SDES  10/12/2021 1511    PERITONEAL Performed at [facility] [place] [address] [place], [place] [address]    SPECREQUEST  10/12/2021 1511    NONE Performed at [facility], [place] [address] [place], [place] [address]    CULT  10/12/2021 1511    NO GROWTH < 24 HOURS Performed at [place] [address], [place], [place] [address]    REPTSTATUS PENDING 10/12/2021 1511    Other culture-see note  Unresulted Labs (From admission, onward)     Start     Ordered   10/12/21 1512  Fungus Culture With Stain   RELEASE UPON ORDERING,   TIMED        10/12/21 1512   10/12/21 1511  Pathologist smear review  Once,   TIMED        10/12/21 1511   10/10/21 0500  Comprehensive metabolic panel  Daily,   R     Question:  Specimen collection method  Answer:  Lab=Lab collect   10/09/21 1744   10/10/21 0500  CBC with Differential/Platelet  Daily,   R     Question:  Specimen collection method  Answer:  Lab=Lab collect   10/09/21 1744   Unscheduled  Occult blood card to lab, stool  As needed,   R      10/08/21 0249          Data Reviewed: I have personally reviewed following labs and imaging studies CBC: Recent Labs  Lab 10/08/21 0327 10/09/21 0600 10/10/21 0639 10/11/21 0536 10/12/21 0520 10/13/21 0537  WBC 12.3* 13.0* 12.8* 15.7* 14.6* 12.7*  NEUTROABS 10.4*  --  10.8* 13.3* 12.5* 11.1*  HGB 9.8* 10.0* 10.5* 11.7* 11.0* 10.5*  HCT 31.8* 32.5* 33.6* 37.9* 35.6* 33.7*  MCV 81.7 82.5 81.4 82.0 81.7 82.4  PLT 541* 530* 532* 513* 543* 097   Basic Metabolic Panel: Recent Labs  Lab 10/08/21 0042 10/08/21 0327 10/09/21 0600 10/10/21 0639 10/11/21 0536 10/12/21 0520 10/13/21 0537  NA  --  134* 136 135 135 135 130*  K  --  4.2 3.9 3.9 3.9 4.5 4.6  CL  --  97* 97* 98 98 98 97*  CO2  --  26 27 28 26 27 25   GLUCOSE  --  86 101* 92 90 89 115*  BUN  --  11 15 13 11 14 16   CREATININE  --  0.56* 0.59* 0.60* 0.57* 0.54* 0.69  CALCIUM  --  8.7* 8.6* 8.2* 8.4* 8.6* 8.2*  MG 1.9 2.0  --   --   --   --   --   PHOS 3.4 3.6  --   --   --   --   --    GFR: Estimated Creatinine Clearance: 69.8 mL/min (by C-G formula based on SCr of 0.69 mg/dL). Liver Function Tests: Recent Labs  Lab 10/09/21 0600 10/10/21 0639 10/11/21 0536 10/12/21 0520 10/13/21 0537  AST 62* 76* 89* 329* 323*  ALT 22 24 27  61* 65*  ALKPHOS 382* 431* 406* 414* 457*  BILITOT 0.8 0.7 0.8 1.1 1.0  PROT 6.4* 6.3* 6.5 6.8 6.3*  ALBUMIN 2.4* 2.4* 2.4* 2.5* 2.3*   Recent Labs  Lab 10/07/21 1819  LIPASE 20   Recent Labs   Lab 10/07/21 1819  AMMONIA 19   Coagulation Profile: Recent Labs  Lab 10/07/21 1819 10/12/21 0520  INR 1.1 1.2   Cardiac Enzymes: Recent Labs  Lab 10/08/21 0042 10/08/21 0327  CKTOTAL 560* 555*   BNP (last 3 results) No results for input(s): PROBNP in the last 8760 hours. HbA1C: No results for input(s): HGBA1C in the last 72 hours. CBG: No results for input(s): GLUCAP in the last 168 hours. Lipid Profile: No results for input(s): CHOL, HDL, LDLCALC, TRIG, CHOLHDL, LDLDIRECT in the last 72 hours. Thyroid Function Tests: No results for input(s): TSH, T4TOTAL, FREET4, T3FREE, THYROIDAB in the last 72 hours. Anemia Panel: No results for input(s): VITAMINB12, FOLATE, FERRITIN, TIBC, IRON, RETICCTPCT in the last 72 hours. Sepsis Labs: Recent Labs  Lab 10/08/21 0042 10/08/21 0327 10/11/21 0813 10/11/21 0906 10/12/21 0520 10/13/21 0537  PROCALCITON  --   --  2.36  --  2.52 6.96  LATICACIDVEN 2.4* 1.8 1.9 2.0*  --   --     Recent Results (from the past 240 hour(s))  Resp Panel by RT-PCR (Flu A&B, Covid) Nasopharyngeal Swab     Status: None   Collection Time: 10/07/21  6:19 PM   Specimen: Nasopharyngeal Swab; Nasopharyngeal(NP) swabs in vial transport medium  Result Value Ref Range Status   SARS Coronavirus 2 by RT PCR NEGATIVE NEGATIVE Final    Comment: (NOTE) SARS-CoV-2 target nucleic acids are NOT DETECTED.  The SARS-CoV-2 RNA is generally detectable in upper respiratory specimens during the acute phase of infection. The lowest concentration of SARS-CoV-2 viral copies this assay can detect is 138 copies/mL. A negative result does not preclude SARS-Cov-2 infection and should not be used as the sole basis for treatment or other patient management decisions. A negative result may occur with  improper specimen collection/handling, submission of specimen other than nasopharyngeal swab, presence of viral mutation(s) within  the areas targeted by this assay, and  inadequate number of viral copies(<138 copies/mL). A negative result must be combined with clinical observations, patient history, and epidemiological information. The expected result is Negative.  Fact Sheet for Patients:  EntrepreneurPulse.com.au  Fact Sheet for Healthcare Providers:  IncredibleEmployment.be  This test is no t yet approved or cleared by the [place] FDA and  has been authorized for detection and/or diagnosis of SARS-CoV-2 by FDA under an Emergency Use Authorization (EUA). This EUA will remain  in effect (meaning this test can be used) for the duration of the COVID-19 declaration under Section 564(b)(1) of the Act, 21 U.S.C.section 360bbb-3(b)(1), unless the authorization is terminated  or revoked sooner.       Influenza A by PCR NEGATIVE NEGATIVE Final   Influenza B by PCR NEGATIVE NEGATIVE Final    Comment: (NOTE) The Xpert Xpress SARS-CoV-2/FLU/RSV plus assay is intended as an aid in the diagnosis of influenza from Nasopharyngeal swab specimens and should not be used as a sole basis for treatment. Nasal washings and aspirates are unacceptable for Xpert Xpress SARS-CoV-2/FLU/RSV testing.  Fact Sheet for Patients: EntrepreneurPulse.com.au  Fact Sheet for Healthcare Providers: IncredibleEmployment.be  This test is not yet approved or cleared by the [place] FDA and has been authorized for detection and/or diagnosis of SARS-CoV-2 by FDA under an Emergency Use Authorization (EUA). This EUA will remain in effect (meaning this test can be used) for the duration of the COVID-19 declaration under Section 564(b)(1) of the Act, 21 U.S.C. section 360bbb-3(b)(1), unless the authorization is terminated or revoked.  Performed at [facility], [address] [place], [place] [address]   Urine Culture     Status: None   Collection Time: 10/07/21  8:10 PM   Specimen: Urine,  Clean Catch  Result Value Ref Range Status   Specimen Description   Final    URINE, CLEAN CATCH Performed at [facility], [place] [place], [place] [address]    Special Requests   Final    NONE Performed at [facility], [address] [place], [place] [address]    Culture   Final    NO GROWTH Performed at [facility], [place] [address], [place], [place] [address]    Report Status 10/09/2021 FINAL  Final  Body fluid culture w Gram Stain     Status: None   Collection Time: 10/08/21 10:38 AM   Specimen: PATH Cytology Peritoneal fluid  Result Value Ref Range Status   Specimen Description   Final    PERITONEAL Performed at [place] [address] [place], [place] [address]    Special Requests   Final    NONE Performed at [facility], [place] [address], [place], [place] [address]    Gram Stain NO WBC SEEN NO ORGANISMS SEEN   Final   Culture   Final    NO GROWTH 3 DAYS Performed at [facility], [place] [address] [place], [place] [address]    Report Status 10/11/2021 FINAL  Final  Culture, blood (routine x 2)     Status: None (Preliminary result)   Collection Time: 10/11/21  8:12 AM   Specimen: BLOOD  Result Value Ref Range Status   Specimen Description   Final    BLOOD BLOOD RIGHT HAND Performed at [place] [address] [place], [place] [address]    Special Requests  Final    BOTTLES DRAWN AEROBIC ONLY Blood Culture adequate volume Performed at [place] [address], [place], [place] [address]    Culture   Final    NO GROWTH 2 DAYS Performed at [place] [address] [place], [place] [address]    Report Status PENDING  Incomplete  Culture, blood (routine x 2)     Status: None (Preliminary result)   Collection Time: 10/11/21  8:15 AM   Specimen: BLOOD  Result Value Ref Range Status   Specimen Description   Final    BLOOD  RIGHT ANTECUBITAL Performed at [place] [address] [place], [place] [address]    Special Requests   Final    BOTTLES DRAWN AEROBIC ONLY Blood Culture adequate volume Performed at [place] [address] [place], [place] [address]    Culture   Final    NO GROWTH 2 DAYS Performed at [place] [address], [place], [place] [address]    Report Status PENDING  Incomplete  Urine Culture     Status: Abnormal   Collection Time: 10/11/21 11:10 AM   Specimen: Urine, Clean Catch  Result Value Ref Range Status   Specimen Description   Final    URINE, CLEAN CATCH Performed at [facility], [place] [address] [place], [place] [address]    Special Requests   Final    NONE Performed at [facility], [place] [address], [place], [place] [address]    Culture (A)  Final    <10,000 COLONIES/mL INSIGNIFICANT GROWTH Performed at [place] [address], [place], [place] [address]    Report Status 10/12/2021 FINAL  Final  Body fluid culture w Gram Stain     Status: None (Preliminary result)   Collection Time: 10/12/21  3:11 PM   Specimen: PATH Cytology Peritoneal fluid  Result Value Ref Range Status   Specimen Description   Final    PERITONEAL Performed at [place] [address] [place], [place] [address]    Special Requests   Final    NONE Performed at [facility], [place] [address] [place], [place] [address]    Gram Stain   Final    FEW WBC PRESENT, PREDOMINANTLY MONONUCLEAR NO ORGANISMS SEEN    Culture   Final    NO GROWTH < 24 HOURS Performed at [place] [address], [place], [place] [address]    Report Status PENDING  Incomplete     Radiology Studies: US BIOPSY (LIVER)  Result Date: 10/12/2021 INDICATION: 65 year old with innumerable liver lesions. Findings are concerning for metastatic disease and tissue diagnosis  is needed. Patient also has a large volume of ascites. EXAM: 1. Ultrasound-guided paracentesis 2. Ultrasound-guided liver lesion biopsy MEDICATIONS: Moderate sedation ANESTHESIA/SEDATION: Moderate (conscious) sedation was employed during this procedure. A total of Versed 2.0mg  and fentanyl 100 mcg was administered intravenously at the order of the provider performing the procedure. Total intra-service moderate sedation time: 26 minutes. Patient's level of consciousness and vital signs were monitored continuously by radiology nurse throughout the procedure under the supervision of the provider performing the procedure. FLUOROSCOPY TIME:  None COMPLICATIONS: None immediate. PROCEDURE: Informed written consent was obtained from the patient after a thorough discussion of the procedural risks, benefits and alternatives. All questions were addressed. A timeout was performed prior to the initiation of the procedure. The abdomen was evaluated  with ultrasound. Ascites was identified around the liver. Subtle lesions throughout the liver. Hyperechoic lesion in left hepatic lobe was targeted for biopsy. The anterior and right side of the abdomen was prepped with chlorhexidine and sterile field was created. Maximal barrier sterile technique was utilized including caps, mask, sterile gowns, sterile gloves, sterile drape, hand hygiene and skin antiseptic. Right lower abdomen was anesthetized with 1% lidocaine. A small incision was made. Using ultrasound guidance, a Safe-T-Centesis catheter was directed into perihepatic ascites. Paracentesis was performed. During the paracentesis, the left hepatic lobe was targeted for biopsy. The left upper abdomen was anesthetized with 1% lidocaine and a small incision was made. Using ultrasound guidance, 17 gauge coaxial needle was directed into the left hepatic lobe and directed into a hyperechoic lesion. Total of 4 core biopsies were obtained with an 18 gauge core device. Specimens placed in  formalin. Gel-Foam slurry was injected through the 17 gauge needle as it was removed. Bandage placed at the biopsy site. Paracentesis catheter was removed. Bandage placed at the paracentesis site. FINDINGS: Liver is diffusely heterogeneous with poorly defined lesions. A hyperechoic lesion in left hepatic lobe was successfully biopsied. 1.9 L of amber colored ascites was removed. IMPRESSION: 1. Ultrasound-guided core biopsy of a left hepatic lesion. 2. Ultrasound-guided paracentesis.  1.9 L of fluid was removed. Electronically Signed   By: Markus Daft M.D.   On: 10/12/2021 16:00   US Paracentesis  Result Date: 10/12/2021 INDICATION: 65 year old with innumerable liver lesions. Findings are concerning for metastatic disease and tissue diagnosis is needed. Patient also has a large volume of ascites. EXAM: 1. Ultrasound-guided paracentesis 2. Ultrasound-guided liver lesion biopsy MEDICATIONS: Moderate sedation ANESTHESIA/SEDATION: Moderate (conscious) sedation was employed during this procedure. A total of Versed 2.0mg  and fentanyl 100 mcg was administered intravenously at the order of the provider performing the procedure. Total intra-service moderate sedation time: 26 minutes. Patient's level of consciousness and vital signs were monitored continuously by radiology nurse throughout the procedure under the supervision of the provider performing the procedure. FLUOROSCOPY TIME:  None COMPLICATIONS: None immediate. PROCEDURE: Informed written consent was obtained from the patient after a thorough discussion of the procedural risks, benefits and alternatives. All questions were addressed. A timeout was performed prior to the initiation of the procedure. The abdomen was evaluated with ultrasound. Ascites was identified around the liver. Subtle lesions throughout the liver. Hyperechoic lesion in left hepatic lobe was targeted for biopsy. The anterior and right side of the abdomen was prepped with chlorhexidine and  sterile field was created. Maximal barrier sterile technique was utilized including caps, mask, sterile gowns, sterile gloves, sterile drape, hand hygiene and skin antiseptic. Right lower abdomen was anesthetized with 1% lidocaine. A small incision was made. Using ultrasound guidance, a Safe-T-Centesis catheter was directed into perihepatic ascites. Paracentesis was performed. During the paracentesis, the left hepatic lobe was targeted for biopsy. The left upper abdomen was anesthetized with 1% lidocaine and a small incision was made. Using ultrasound guidance, 17 gauge coaxial needle was directed into the left hepatic lobe and directed into a hyperechoic lesion. Total of 4 core biopsies were obtained with an 18 gauge core device. Specimens placed in formalin. Gel-Foam slurry was injected through the 17 gauge needle as it was removed. Bandage placed at the biopsy site. Paracentesis catheter was removed. Bandage placed at the paracentesis site. FINDINGS: Liver is diffusely heterogeneous with poorly defined lesions. A hyperechoic lesion in left hepatic lobe was successfully biopsied. 1.9 L of amber colored ascites was  removed. IMPRESSION: 1. Ultrasound-guided core biopsy of a left hepatic lesion. 2. Ultrasound-guided paracentesis.  1.9 L of fluid was removed. Electronically Signed   By: Markus Daft M.D.   On: 10/12/2021 16:00     LOS: 3 days   Antonieta Pert, MD [facility]  10/13/2021, 10:13 AM

## 2021-10-13 NOTE — Consult Note (Signed)
Palliative Care Consult Note  Reason for consult: Goals of care and pain management  Palliative care consult received.  Chart reviewed including personal review of pertinent labs and imaging.  Briefly, Jason Mcguire is a 65 year old male with past medical history of asthma, ADHD and anxiety who presented with shortness of breath and worsened abdominal distention for about 3 weeks.  He was found out elevated LFTs, ascites with CT abdomen pelvis and liver lesions concerning for metastatic cancer.  There are also lesions noted in lungs on imaging.  He underwent paracentesis as well as liver biopsy.  I met today with him and his sister.  We discussed his clinical course and understanding of situation.  He understands he has likely metastatic disease and is not going to be cured.  At the same time, he is invested in plan to wait results of biopsy and discuss further with oncology prior to making decisions about long-term goals.  We discussed his pain management.  He reports he has pain in his lower abdomen as well as around his right upper quadrant.  Pain worsens intermittently and does improve with pain management.  He reports when pain is bad it is 10 out of 10.  It does improve to around half of this (5 out of 10) with pain medication.  He finds Dilaudid to be effective in helping to relieve his pain.  He is not the the hydrocodone does much to help with his pain.  We discussed options for pain management and cancer-related pain and a plan to initiate long-acting medication with rotation of short acting medication from hydrocodone to oxycodone once we better determine what his overall needs are likely to be.  - Full code/full scope - Pain, cancer related: MAR reviewed and he has used the equivalent of 125m of oral morphine.  Start oxycontin 22mtwice daily.  He does not find hydrocodone to be effective.  Increase dilaudid frequency to every 2 hours as needed.  Discussed evaluating overall needs tomorrow  and then working to transition to oral outpatient regimen (likely oxycontin with oxycodone for breakthrough) - Constipation, opioid related.  Start senna s.  Consider addition of miralax tomorrow if no BM. - GOC: would like to name his sister as HCPOA.  Appreciate chaplain assistance.  He understands that this is not a curable illness but still no information regarding diagnosis or potential disease modifying therapy.  Await biopsy and input from oncology (either during this admission or as an outpatient).  Start time: 1750 End time: 1910 Total time: 80 minutes  Greater than 50%  of this time was spent counseling and coordinating care related to the above assessment and plan.  GeMicheline RoughMD [place] [phone]

## 2021-10-13 NOTE — Progress Notes (Signed)
Occupational Therapy Treatment Patient Details Name: Jason Mcguire MRN: [id number] DOB: 05-Aug-1956 Today's Date: 10/13/2021   History of present illness Patient is a 65 year old male who presented to the hospital with shortness of breath and BLE edema. patient was fount o have malignant neoplasm metastatic to liver, SOB, adbominal ascites, and dehydration.patient underwent paracentesis on 12/23 removing 1.6L of fluid. paracentesis and biopsy of liver on 12/27 with 1.9 L removed.  PMH: alcohol use, ADD, asthma, and COPD.   OT comments  Patient was able to complete toileting tasks with MI on this date with patient noted to have redness around stool still stuck to side of commode in bathroom after toileting tasks. Nurse was informed. Patient was educated on ECT, environmental modifications at home and importance of prioritizing tasks each day. Patient reported he does not need more OT at this time. Patient appears to be at baseline at this time. Patient plans to transition to sisters house at time of d/c. OT signing off at this time per patient request.    Recommendations for follow up therapy are one component of a multi-disciplinary discharge planning process, led by the attending physician.  Recommendations may be updated based on patient status, additional functional criteria and insurance authorization.    Follow Up Recommendations  No OT follow up    Assistance Recommended at Discharge Frequent or constant Supervision/Assistance  Equipment Recommendations  None recommended by OT    Recommendations for Other Services      Precautions / Restrictions Precautions Precautions: Fall Precaution Comments: monitor vitals Restrictions Weight Bearing Restrictions: No       Mobility Bed Mobility Overal bed mobility: Modified Independent                  Transfers                         Balance Overall balance assessment: No apparent balance deficits (not formally  assessed)                                         ADL either performed or assessed with clinical judgement   ADL Overall ADL's : Modified independent                                       General ADL Comments: patient was approached this AM for session with patient declining to participate. patient reported that he was too fatigued from just having gone to bathroom with blood noted with stool. patient had since flushed toilet not observed at this time by therapist and nurse was informed. patient was apporached later as per patient request. patient is able to complete toileting tasks with MI with no AD with no LOB. patient wsa able to simulate donning socks with education on importance of keeping non skid materials on feet. patient reported having hang nail on L pinky toe that needed to be cut prior to donning socks or shoes. patient's nurse was updated. patient continued to report increaesd BM with "blood around stool". patient was noted to have redness around residual BM in commode on this date. nurse was informed. nurse to consult with MD. patient endorses being at baseline not needing OT services at this time.    Extremity/Trunk Assessment  Vision       Perception     Praxis      Cognition Arousal/Alertness: Awake/alert Behavior During Therapy: WFL for tasks assessed/performed Overall Cognitive Status: Within Functional Limits for tasks assessed                                 General Comments: was noted to have poor safety awareness during session and reporting different symptoms to OT and nurse.          Exercises     Shoulder Instructions       General Comments      Pertinent Vitals/ Pain       Pain Assessment: No/denies pain  Home Living                                          Prior Functioning/Environment              Frequency           Progress Toward Goals  OT  Goals(current goals can now be found in the care plan section)     Acute Rehab OT Goals OT Goal Formulation: All assessment and education complete, DC therapy ADL Goals Pt Will Perform Lower Body Dressing: with modified independence;sit to/from stand Pt Will Transfer to Toilet: with modified independence;ambulating;regular height toilet Pt Will Perform Toileting - Clothing Manipulation and hygiene: with modified independence;sit to/from stand Additional ADL Goal #1: patient declined to particiapte in this task  Plan Discharge plan remains appropriate    Co-evaluation                 AM-PAC OT "6 Clicks" Daily Activity     Outcome Measure   Help from another person eating meals?: None Help from another person taking care of personal grooming?: None Help from another person toileting, which includes using toliet, bedpan, or urinal?: None Help from another person bathing (including washing, rinsing, drying)?: None Help from another person to put on and taking off regular upper body clothing?: None Help from another person to put on and taking off regular lower body clothing?: None 6 Click Score: 24    End of Session    OT Visit Diagnosis: Unsteadiness on feet (R26.81);Muscle weakness (generalized) (M62.81)   Activity Tolerance Patient tolerated treatment well   Patient Left in bed;with call bell/phone within reach   Nurse Communication Other (comment) (BM with redness noted and patients conflicting reports from earlier today.)        Time: 1427-1450 OT Time Calculation (min): 23 min  Charges: OT General Charges $OT Visit: 1 Visit OT Treatments $Self Care/Home Management : 23-37 mins  Jackelyn Poling OTR/L, MS Acute Rehabilitation Department Office# [phone] Pager# [phone]   Marcellina Millin 10/13/2021, 3:00 PM

## 2021-10-13 NOTE — Progress Notes (Signed)
PT Cancellation Note  Patient Details Name: Jason Mcguire MRN: [id number] DOB: 1955-11-22   Cancelled Treatment:    Reason Eval/Treat Not Completed: Medical issues which prohibited therapy, pt. States he ambulated to Br and had blood in Stool. Encouraged patient to communicate to RN, RN updated by the OT. Will check back another time.   Claretha Cooper 10/13/2021, 1:10 PM [place] Pager [phone] Office [phone]

## 2021-10-13 NOTE — Progress Notes (Signed)
Chaplain received a consult that Jason Mcguire wanted to assign sisters as HCPOA.  Jason Mcguire was in pain at the time of the visit, so I received verbal confirmation that Jason Mcguire wanted to assign his sisters.  I provided Jason Mcguire with the paperwork and reviewed it with her so that she could fill it out with him when he was in less pain.  Chaplain also provided emotional support to Jason Mcguire and will return tomorrow to assist in getting document notarized.    [place], B[facility] Pager, [phone] 4:45 PM

## 2021-10-14 DIAGNOSIS — Z515 Encounter for palliative care: Secondary | ICD-10-CM | POA: Diagnosis not present

## 2021-10-14 DIAGNOSIS — G893 Neoplasm related pain (acute) (chronic): Secondary | ICD-10-CM | POA: Diagnosis not present

## 2021-10-14 DIAGNOSIS — C787 Secondary malignant neoplasm of liver and intrahepatic bile duct: Secondary | ICD-10-CM | POA: Diagnosis not present

## 2021-10-14 LAB — CBC WITH DIFFERENTIAL/PLATELET
Abs Immature Granulocytes: 0.11 10*3/uL — ABNORMAL HIGH (ref 0.00–0.07)
Basophils Absolute: 0 10*3/uL (ref 0.0–0.1)
Basophils Relative: 0 %
Eosinophils Absolute: 0 10*3/uL (ref 0.0–0.5)
Eosinophils Relative: 0 %
HCT: 42.7 % (ref 39.0–52.0)
Hemoglobin: 12.6 g/dL — ABNORMAL LOW (ref 13.0–17.0)
Immature Granulocytes: 1 %
Lymphocytes Relative: 6 %
Lymphs Abs: 0.9 10*3/uL (ref 0.7–4.0)
MCH: 25.6 pg — ABNORMAL LOW (ref 26.0–34.0)
MCHC: 29.5 g/dL — ABNORMAL LOW (ref 30.0–36.0)
MCV: 86.8 fL (ref 80.0–100.0)
Monocytes Absolute: 0.9 10*3/uL (ref 0.1–1.0)
Monocytes Relative: 6 %
Neutro Abs: 13.1 10*3/uL — ABNORMAL HIGH (ref 1.7–7.7)
Neutrophils Relative %: 87 %
Platelets: 444 10*3/uL — ABNORMAL HIGH (ref 150–400)
RBC: 4.92 MIL/uL (ref 4.22–5.81)
RDW: 20 % — ABNORMAL HIGH (ref 11.5–15.5)
WBC: 15 10*3/uL — ABNORMAL HIGH (ref 4.0–10.5)
nRBC: 0 % (ref 0.0–0.2)

## 2021-10-14 LAB — COMPREHENSIVE METABOLIC PANEL
ALT: 46 U/L — ABNORMAL HIGH (ref 0–44)
AST: 125 U/L — ABNORMAL HIGH (ref 15–41)
Albumin: 2.3 g/dL — ABNORMAL LOW (ref 3.5–5.0)
Alkaline Phosphatase: 457 U/L — ABNORMAL HIGH (ref 38–126)
Anion gap: 12 (ref 5–15)
BUN: 16 mg/dL (ref 8–23)
CO2: 27 mmol/L (ref 22–32)
Calcium: 8.7 mg/dL — ABNORMAL LOW (ref 8.9–10.3)
Chloride: 95 mmol/L — ABNORMAL LOW (ref 98–111)
Creatinine, Ser: 0.58 mg/dL — ABNORMAL LOW (ref 0.61–1.24)
GFR, Estimated: >60 mL/min (ref >60)
Glucose, Bld: 90 mg/dL (ref 70–99)
Potassium: 4.3 mmol/L (ref 3.5–5.1)
Sodium: 134 mmol/L — ABNORMAL LOW (ref 135–145)
Total Bilirubin: 1.1 mg/dL (ref 0.3–1.2)
Total Protein: 6.8 g/dL (ref 6.5–8.1)

## 2021-10-14 MED ORDER — OXYCODONE HCL 5 MG PO TABS
10.0000 mg | ORAL_TABLET | ORAL | Status: DC | PRN
  Administered 2021-10-14 – 2021-10-24 (×28): 10 mg via ORAL
  Filled 2021-10-14 (×30): qty 2

## 2021-10-14 MED ORDER — ALPRAZOLAM 1 MG PO TABS
1.0000 mg | ORAL_TABLET | Freq: Three times a day (TID) | ORAL | Status: DC | PRN
  Administered 2021-10-14 – 2021-10-23 (×12): 1 mg via ORAL
  Filled 2021-10-14 (×13): qty 1

## 2021-10-14 MED ORDER — HYDROMORPHONE HCL 1 MG/ML IJ SOLN
1.0000 mg | INTRAMUSCULAR | Status: DC | PRN
  Administered 2021-10-15 – 2021-10-23 (×20): 1 mg via INTRAVENOUS
  Filled 2021-10-14 (×20): qty 1

## 2021-10-14 MED ORDER — OXYCODONE HCL ER 40 MG PO T12A
40.0000 mg | EXTENDED_RELEASE_TABLET | Freq: Two times a day (BID) | ORAL | Status: DC
  Administered 2021-10-14 – 2021-10-18 (×7): 40 mg via ORAL
  Filled 2021-10-14 (×7): qty 1

## 2021-10-14 MED ORDER — OXYCODONE HCL ER 20 MG PO T12A
20.0000 mg | EXTENDED_RELEASE_TABLET | Freq: Two times a day (BID) | ORAL | Status: AC
  Administered 2021-10-14: 11:00:00 20 mg via ORAL
  Filled 2021-10-14: qty 1
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
## 2021-10-14 NOTE — Evaluation (Addendum)
Physical Therapy Evaluation Patient Details Name: Jason Mcguire MRN: [id number] DOB: 1956/09/22 Today's Date: 10/14/2021  History of Present Illness  Patient is a 65 year old male who presented to the hospital with shortness of breath and BLE edema. patient was found to have malignant neoplasm metastatic to liver, SOB, adbominal ascites, and dehydration.patient underwent paracentesis on 12/23 removing 1.6L of fluid. paracentesis and biopsy of liver on 12/27 with 1.9 L removed.  PMH: alcohol use, ADD, asthma, and COPD.  Clinical Impression  Pt admitted with above diagnosis. Pt had R lateral loss of balance x 2 with sit to stand trials. He was able to self correct by reaching for bedrail. Pt ambulated 50' in his room without an assistive device without loss of balance. Distance limited by poorly controlled BMs, and pt preferring to eat lunch rather than walk in hall after having a BM.  Pt currently with functional limitations due to the deficits listed below (see PT Problem List). Pt will benefit from skilled PT to increase their independence and safety with mobility to allow discharge to the venue listed below.          Recommendations for follow up therapy are one component of a multi-disciplinary discharge planning process, led by the attending physician.  Recommendations may be updated based on patient status, additional functional criteria and insurance authorization.  Follow Up Recommendations No PT follow up    Assistance Recommended at Discharge Set up Supervision/Assistance  Functional Status Assessment Patient has had a recent decline in their functional status and demonstrates the ability to make significant improvements in function in a reasonable and predictable amount of time.  Equipment Recommendations  None recommended by PT (pt refused AD)    Recommendations for Other Services       Precautions / Restrictions Precautions Precautions: Fall Precaution Comments: monitor  vitals Restrictions Weight Bearing Restrictions: No      Mobility  Bed Mobility Overal bed mobility: Modified Independent             General bed mobility comments: used rail    Transfers Overall transfer level: Needs assistance Equipment used: None Transfers: Sit to/from Stand Sit to Stand: Supervision;Min guard           General transfer comment: initially unsteady with sit to stand with R lateral loss of balance, pt caught himself using bedrail, this happened with first 2 attempts of sit to stand, on 3rd attempt he had no loss of balance    Ambulation/Gait Ambulation/Gait assistance: Supervision Gait Distance (Feet): 50 Feet Assistive device: None Gait Pattern/deviations: Step-through pattern;Decreased stride length Gait velocity: WFL     General Gait Details: upon standing and taking a few steps pt had large amount of flatulance, so walked to bathroom where he had a BM. pt then ambulated in room due to having frequent uncontrolled BMs. Distance limited due to pt wanting to eat his lunch. No loss of balance with ambulation  Stairs            Wheelchair Mobility    Modified Rankin (Stroke Patients Only)       Balance Overall balance assessment: Mild deficits observed, not formally tested                                           Pertinent Vitals/Pain Pain Assessment: 0-10 Pain Score: 6  Pain Location: abdomen Pain Descriptors /  Indicators: Aching Pain Intervention(s): Limited activity within patient's tolerance;Monitored during session;Premedicated before session    [place] expects to be discharged to:: Private residence Living Arrangements: Alone Available Help at Discharge: Family;Available PRN/intermittently Type of Home: House Home Access: Stairs to enter   [employer] of Steps: 2   Home Layout: One level Home Equipment: None Additional Comments: builds fences and decks, was driving PTA     Prior Function Prior Level of Function : Independent/Modified Independent;Driving               ADLs Comments: patient reported he was still working on " a new job" prior to hospitalization     Hand Dominance   Dominant Hand: Right    Extremity/Trunk Assessment   Upper Extremity Assessment Upper Extremity Assessment: Defer to OT evaluation    Lower Extremity Assessment Lower Extremity Assessment: Overall WFL for tasks assessed    Cervical / Trunk Assessment Cervical / Trunk Assessment: Normal  Communication   Communication: No difficulties  Cognition Arousal/Alertness: Awake/alert Behavior During Therapy: Anxious Overall Cognitive Status: Within Functional Limits for tasks assessed                                 General Comments: concerned about blood in stool, RN aware        General Comments      Exercises     Assessment/Plan    PT Assessment Patient needs continued PT services  PT Problem List Decreased balance;Decreased activity tolerance;Decreased mobility;Pain       PT Treatment Interventions Gait training;Therapeutic exercise;Balance training;Functional mobility training    PT Goals (Current goals can be found in the Care Plan section)  Acute Rehab PT Goals Patient 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 PT Goal Formulation: With patient Time For Goal Achievement: 10/28/21 Potential to Achieve Goals: Good    Frequency Min 3X/week   Barriers to discharge        Co-evaluation               AM-PAC PT "6 Clicks" Mobility  Outcome Measure Help needed turning from your back to your side while in a flat bed without using bedrails?: None Help needed moving from lying on your back to sitting on the side of a flat bed without using bedrails?: A Little Help needed moving to and from a bed to a chair (including a wheelchair)?: A Little Help needed standing up from a chair using your arms (e.g., wheelchair  or bedside chair)?: None Help needed to walk in hospital room?: A Little Help needed climbing 3-5 steps with a railing? : A Little 6 Click Score: 20    End of Session Equipment Utilized During Treatment: Gait belt Activity Tolerance: Patient tolerated treatment well Patient left: in bed;with call bell/phone within reach;with nursing/sitter in room Nurse Communication: Mobility status PT Visit Diagnosis: Difficulty in walking, not elsewhere classified (R26.2)    Time: 4098-1191 PT Time Calculation (min) (ACUTE ONLY): 21 min   Charges:   PT Evaluation $PT Eval Moderate Complexity: 1 Mod         Philomena Doheny PT 10/14/2021  Acute Rehabilitation Services Pager [phone] Office [phone]

## 2021-10-14 NOTE — Progress Notes (Signed)
PROGRESS NOTE    Jason Mcguire  IWP:[id number] DOB: 23-Mar-1956 DOA: 10/07/2021 PCP: Kathyrn Lass, MD   Chief Complaint  Patient presents with   Abdominal Pain   Shortness of Breath  Brief Narrative/Hospital Course: Jason Mcguire, 65 y.o. male with PMH of asthma, ADHD, presented with SOB, worsening abdominal distension ongoing for about 3 weeks PTA.Hx of heavy alcohol abuse when he was younger, currently drinks socially. In the ED, noted to have elevated LFTs, ascites with CT abd/pelvis showing liver lesions concerning for metastatic cancer.   Patient was admitted underwent further work-up.  He had paracentesis on 12/23 that showed no signs of infection.Underwent image guided liver biopsy and repeat paracentesis 12/27   Subjective: Seen and examined.  Sister is at the bedside.  Patient complains of ongoing pain abdominal discomfort He had a big bowel movement with small amount of blood at the end He feels better after being off telemetry Overnight afebrile, doing well on room air Hemoglobin has stayed stable Still with leukocytosis uptrending  Assessment & Plan:  Diffuse innumerable bilobar hepatic lesion concerning for metastatic disease Abdominal adenopathy large volume ascites-recurrent Abnormal LFTs: Multiple solid bilateral pulmonary nodules: Presentation with adenopathy ascites pulmonary nodule and liver lesions concerning for malignancy.  Initial paracentesis 12/23 no signs of infection, cytology-nonmalignant cell and no organism seen. S/p  repeat paracentesis and liver biopsy in 12/27-biopsy results pending cytology shows reactive nature of cells-further plan pending based on biopsy result   Pain management Cancer related pain: 12/28 changed to oxycontin- and iv Dilaudid prn,-dose is being adjusted again, appreciate palliative input and pain management.  Centrilobular emphysema noted in the CT chest.  Leukocytosis so far no evidence of infection on UA chest x-ray  and initial paracentesis, blood culture negative x2 although procalcitonin elevated-which could be in the setting of metastatic disease.  Monitor for any signs of infection.  Monitor CBC Recent Labs  Lab 10/07/21 1938 10/08/21 0042 10/08/21 0327 10/09/21 0600 10/10/21 0639 10/11/21 0536 10/11/21 0813 10/11/21 0906 10/12/21 0520 10/13/21 0537 10/14/21 0754  WBC  --   --  12.3*   < > 12.8* 15.7*  --   --  14.6* 12.7* 15.0*  LATICACIDVEN 2.5* 2.4* 1.8  --   --   --  1.9 2.0*  --   --   --   PROCALCITON  --   --   --   --   --   --  2.36  --  2.52 6.96  --    < > = values in this interval not displayed.    Iron-deficiency anemia likely from malignancy monitor hb  Rectal bleeding: Patient had episodes of bleeding at the end of bowel movement per nursing staff.  Patient has remained stable, will hold off on resuming DVT prophylaxis.  Elevated TSH with normal free T4 outpatient follow-up in 3 weeks  Hyponatremia monitor bmp  Anxiety disorder ADHD: Mood still anxious requesting to increase Xanax . Cont adderal.  Goals of care: Chaplain consulted sister at the bedside, Perative care is on board.  Appreciate input from pain management.  Significant weight loss 61.2 in Sept to  > 53.6  kg-suspected due to malignancy, severe protein malnutrition.  Supplement diet RD eval    DVT prophylaxis: SCDs Start: 10/08/21 0031.  Lovenox held 2/2 biopsy and rectal bleed Code Status:   Code Status: Full Code Family Communication: plan of care discussed with patient and his ssister  at bedside. Status is: Inpatient Remains inpatient appropriate because:  For ongoing management of abdominal pain ascites and biopsy result Disposition: Currently not medically stable for discharge. Anticipated Disposition: TBD   Objective: Vitals last 24 hrs: Vitals:   10/13/21 0439 10/13/21 1700 10/13/21 2100 10/14/21 0804  BP: (!) 151/71 (!) 154/74 (!) 153/72 (!) 159/108  Pulse: 92 99 83 84  Resp: 18 16 16     Temp: 98.3 F (36.8 C) 97.6 F (36.4 C) 97.9 F (36.6 C) 97.6 F (36.4 C)  TempSrc: Oral Oral Oral Oral  SpO2: 93% 98%  95%  Weight:      Height:       Weight change:   Intake/Output Summary (Last 24 hours) at 10/14/2021 1014 Last data filed at 10/14/2021 0500 Gross per 24 hour  Intake 240 ml  Output 300 ml  Net -60 ml   Net IO Since Admission: -1,222.09 mL [10/14/21 1014]   Physical Examination: General exam: AAOx 3, thin frail, older than stated age, weak appearing. HEENT:Oral mucosa moist, Ear/Nose WNL grossly, dentition normal. Respiratory system: bilaterally clear, no use of accessory muscle Cardiovascular system: S1 & S2 +, No JVD,. Gastrointestinal system: Abdomen soft, moderately distended generalized tenderness present BS+ Nervous System:Alert, awake, moving extremities and grossly nonfocal Extremities: trace Lower leg edema more on the left than the right. distal peripheral pulses palpable.  Skin: No rashes,no icterus. MSK: Normal muscle bulk,tone, power   Medications reviewed:  Scheduled Meds:  amphetamine-dextroamphetamine  20 mg Oral BID WC   feeding supplement  237 mL Oral BID BM   oxyCODONE  20 mg Oral Q12H   senna-docusate  2 tablet Oral BID   sodium chloride flush  3 mL Intravenous Q12H   thiamine  100 mg Oral Daily   Continuous Infusions:  sodium chloride 250 mL (10/08/21 1112)    Diet Order             Diet 2 gram sodium Room service appropriate? Yes; Fluid consistency: Thin  Diet effective now                   Nutrition Problem: Increased nutrient needs Etiology: cancer and cancer related treatments Signs/Symptoms: estimated needs Interventions: Ensure Enlive (each supplement provides 350kcal and 20 grams of protein)  Weight change:   Wt Readings from Last 3 Encounters:  10/07/21 53.6 kg  01/25/19 59 kg  06/20/13 61.2 kg  Consultants:see note  Procedures:see note Antimicrobials: Anti-infectives (From admission, onward)     None      Culture/Microbiology    Component Value Date/Time   SDES  10/12/2021 1511    PERITONEAL Performed at [facility], [place] [address], [place], [place] [address]    SPECREQUEST  10/12/2021 1511    NONE Performed at [facility], [place] [address], [place], [place] [address]    CULT  10/12/2021 1511    NO GROWTH 2 DAYS Performed at [place] [address] [place],  [address]    REPTSTATUS PENDING 10/12/2021 1511    Other culture-see note  Unresulted Labs (From admission, onward)     Start     Ordered   10/13/21 1455  Occult blood card to lab, stool  ONCE - STAT,   STAT        10/13/21 1458   10/12/21 1512  Fungus Culture With Stain  RELEASE UPON ORDERING,   TIMED        10/12/21 1512   10/10/21 0500  CBC with Differential/Platelet  Daily,   R  Question:  Specimen collection method  Answer:  Lab=Lab collect   10/09/21 1744   Unscheduled  Occult blood card to lab, stool  As needed,   R      10/08/21 0249          Data Reviewed: I have personally reviewed following labs and imaging studies CBC: Recent Labs  Lab 10/10/21 0639 10/11/21 0536 10/12/21 0520 10/13/21 0537 10/13/21 2000 10/14/21 0754  WBC 12.8* 15.7* 14.6* 12.7*  --  15.0*  NEUTROABS 10.8* 13.3* 12.5* 11.1*  --  13.1*  HGB 10.5* 11.7* 11.0* 10.5* 10.6* 12.6*  HCT 33.6* 37.9* 35.6* 33.7* 33.9* 42.7  MCV 81.4 82.0 81.7 82.4  --  86.8  PLT 532* 513* 543* 333  --  235*   Basic Metabolic Panel: Recent Labs  Lab 10/08/21 0042 10/08/21 0327 10/09/21 0600 10/10/21 0639 10/11/21 0536 10/12/21 0520 10/13/21 0537 10/14/21 0754  NA  --  134*   < > 135 135 135 130* 134*  K  --  4.2   < > 3.9 3.9 4.5 4.6 4.3  CL  --  97*   < > 98 98 98 97* 95*  CO2  --  26   < > 28 26 27 25 27   GLUCOSE  --  86   < > 92 90 89 115* 90  BUN  --  11   < > 13 11 14 16 16   CREATININE  --  0.56*   < > 0.60* 0.57* 0.54* 0.69 0.58*  CALCIUM  --  8.7*   < > 8.2*  8.4* 8.6* 8.2* 8.7*  MG 1.9 2.0  --   --   --   --   --   --   PHOS 3.4 3.6  --   --   --   --   --   --    < > = values in this interval not displayed.   GFR: Estimated Creatinine Clearance: 69.8 mL/min (A) (by C-G formula based on SCr of 0.58 mg/dL (L)). Liver Function Tests: Recent Labs  Lab 10/10/21 0639 10/11/21 0536 10/12/21 0520 10/13/21 0537 10/14/21 0754  AST 76* 89* 329* 323* 125*  ALT 24 27 61* 65* 46*  ALKPHOS 431* 406* 414* 457* 457*  BILITOT 0.7 0.8 1.1 1.0 1.1  PROT 6.3* 6.5 6.8 6.3* 6.8  ALBUMIN 2.4* 2.4* 2.5* 2.3* 2.3*   Recent Labs  Lab 10/07/21 1819  LIPASE 20   Recent Labs  Lab 10/07/21 1819  AMMONIA 19   Coagulation Profile: Recent Labs  Lab 10/07/21 1819 10/12/21 0520  INR 1.1 1.2   Cardiac Enzymes: Recent Labs  Lab 10/08/21 0042 10/08/21 0327  CKTOTAL 560* 555*   BNP (last 3 results) No results for input(s): PROBNP in the last 8760 hours. HbA1C: No results for input(s): HGBA1C in the last 72 hours. CBG: No results for input(s): GLUCAP in the last 168 hours. Lipid Profile: No results for input(s): CHOL, HDL, LDLCALC, TRIG, CHOLHDL, LDLDIRECT in the last 72 hours. Thyroid Function Tests: No results for input(s): TSH, T4TOTAL, FREET4, T3FREE, THYROIDAB in the last 72 hours. Anemia Panel: No results for input(s): VITAMINB12, FOLATE, FERRITIN, TIBC, IRON, RETICCTPCT in the last 72 hours. Sepsis Labs: Recent Labs  Lab 10/08/21 0042 10/08/21 0327 10/11/21 0813 10/11/21 0906 10/12/21 0520 10/13/21 0537  PROCALCITON  --   --  2.36  --  2.52 6.96  LATICACIDVEN 2.4* 1.8 1.9 2.0*  --   --     Recent  Results (from the past 240 hour(s))  Resp Panel by RT-PCR (Flu A&B, Covid) Nasopharyngeal Swab     Status: None   Collection Time: 10/07/21  6:19 PM   Specimen: Nasopharyngeal Swab; Nasopharyngeal(NP) swabs in vial transport medium  Result Value Ref Range Status   SARS Coronavirus 2 by RT PCR NEGATIVE NEGATIVE Final    Comment:  (NOTE) SARS-CoV-2 target nucleic acids are NOT DETECTED.  The SARS-CoV-2 RNA is generally detectable in upper respiratory specimens during the acute phase of infection. The lowest concentration of SARS-CoV-2 viral copies this assay can detect is 138 copies/mL. A negative result does not preclude SARS-Cov-2 infection and should not be used as the sole basis for treatment or other patient management decisions. A negative result may occur with  improper specimen collection/handling, submission of specimen other than nasopharyngeal swab, presence of viral mutation(s) within the areas targeted by this assay, and inadequate number of viral copies(<138 copies/mL). A negative result must be combined with clinical observations, patient history, and epidemiological information. The expected result is Negative.  Fact Sheet for Patients:  EntrepreneurPulse.com.au  Fact Sheet for Healthcare Providers:  IncredibleEmployment.be  This test is no t yet approved or cleared by the [place] FDA and  has been authorized for detection and/or diagnosis of SARS-CoV-2 by FDA under an Emergency Use Authorization (EUA). This EUA will remain  in effect (meaning this test can be used) for the duration of the COVID-19 declaration under Section 564(b)(1) of the Act, 21 U.S.C.section 360bbb-3(b)(1), unless the authorization is terminated  or revoked sooner.       Influenza A by PCR NEGATIVE NEGATIVE Final   Influenza B by PCR NEGATIVE NEGATIVE Final    Comment: (NOTE) The Xpert Xpress SARS-CoV-2/FLU/RSV plus assay is intended as an aid in the diagnosis of influenza from Nasopharyngeal swab specimens and should not be used as a sole basis for treatment. Nasal washings and aspirates are unacceptable for Xpert Xpress SARS-CoV-2/FLU/RSV testing.  Fact Sheet for Patients: EntrepreneurPulse.com.au  Fact Sheet for Healthcare  Providers: IncredibleEmployment.be  This test is not yet approved or cleared by the [place] FDA and has been authorized for detection and/or diagnosis of SARS-CoV-2 by FDA under an Emergency Use Authorization (EUA). This EUA will remain in effect (meaning this test can be used) for the duration of the COVID-19 declaration under Section 564(b)(1) of the Act, 21 U.S.C. section 360bbb-3(b)(1), unless the authorization is terminated or revoked.  Performed at [facility], [address] [place], [place] [address]   Urine Culture     Status: None   Collection Time: 10/07/21  8:10 PM   Specimen: Urine, Clean Catch  Result Value Ref Range Status   Specimen Description   Final    URINE, CLEAN CATCH Performed at [facility], [address] [place], [place] [address]    Special Requests   Final    NONE Performed at [facility], [address] [place], [place] [address]    Culture   Final    NO GROWTH Performed at [facility], [place] [address], [place],  [address]    Report Status 10/09/2021 FINAL  Final  Body fluid culture w Gram Stain     Status: None   Collection Time: 10/08/21 10:38 AM   Specimen: PATH Cytology Peritoneal fluid  Result Value Ref Range Status   Specimen Description   Final    PERITONEAL Performed at [facility],  [place] [address] [place], [place] [address]    Special Requests   Final    NONE Performed at [facility], [place] [address], [place], [place] [address]    Gram Stain NO WBC SEEN NO ORGANISMS SEEN   Final   Culture   Final    NO GROWTH 3 DAYS Performed at [facility], [place] [address], [place], [place] [address]    Report Status 10/11/2021 FINAL  Final  Culture, blood (routine x 2)     Status: None (Preliminary result)   Collection Time: 10/11/21  8:12 AM   Specimen: BLOOD  Result Value Ref Range Status   Specimen Description   Final     BLOOD BLOOD RIGHT HAND Performed at [place] [address] [place], [place] [address]    Special Requests   Final    BOTTLES DRAWN AEROBIC ONLY Blood Culture adequate volume Performed at [place] [address], [place], [place] [address]    Culture   Final    NO GROWTH 3 DAYS Performed at [facility], [place] [address] [place], [place] [address]    Report Status PENDING  Incomplete  Culture, blood (routine x 2)     Status: None (Preliminary result)   Collection Time: 10/11/21  8:15 AM   Specimen: BLOOD  Result Value Ref Range Status   Specimen Description   Final    BLOOD RIGHT ANTECUBITAL Performed at [place] [address] [place], [place] [address]    Special Requests   Final    BOTTLES DRAWN AEROBIC ONLY Blood Culture adequate volume Performed at [place] [address], [place], [place] [address]    Culture   Final    NO GROWTH 3 DAYS Performed at [facility], [place] [address], [place], [place] [address]    Report Status PENDING  Incomplete  Urine Culture     Status: Abnormal   Collection Time: 10/11/21 11:10 AM   Specimen: Urine, Clean Catch  Result Value Ref Range Status   Specimen Description   Final    URINE, CLEAN CATCH Performed at [facility], [place] [address] [place], [place] [address]    Special Requests   Final    NONE Performed at [facility], [place] [address] [place], [place] [address]    Culture (A)  Final    <10,000 COLONIES/mL INSIGNIFICANT GROWTH Performed at [place] [address], [place], [place] [address]    Report Status 10/12/2021 FINAL  Final  Body fluid culture w Gram Stain     Status: None (Preliminary result)   Collection Time: 10/12/21  3:11 PM   Specimen: PATH Cytology Peritoneal fluid  Result Value Ref Range Status   Specimen Description   Final    PERITONEAL Performed  at [place] [address] [place], [place] [address]    Special Requests   Final    NONE Performed at [facility], [place] [address] [place], [place] [address]    Gram Stain   Final    FEW WBC PRESENT, PREDOMINANTLY MONONUCLEAR NO ORGANISMS SEEN    Culture   Final    NO GROWTH 2 DAYS Performed at [place] [address], [place], [place] [address]    Report Status PENDING  Incomplete     Radiology Studies: US BIOPSY (LIVER)  Result Date:  10/12/2021 INDICATION: 65 year old with innumerable liver lesions. Findings are concerning for metastatic disease and tissue diagnosis is needed. Patient also has a large volume of ascites. EXAM: 1. Ultrasound-guided paracentesis 2. Ultrasound-guided liver lesion biopsy MEDICATIONS: Moderate sedation ANESTHESIA/SEDATION: Moderate (conscious) sedation was employed during this procedure. A total of Versed 2.0mg  and fentanyl 100 mcg was administered intravenously at the order of the provider performing the procedure. Total intra-service moderate sedation time: 26 minutes. Patient's level of consciousness and vital signs were monitored continuously by radiology nurse throughout the procedure under the supervision of the provider performing the procedure. FLUOROSCOPY TIME:  None COMPLICATIONS: None immediate. PROCEDURE: Informed written consent was obtained from the patient after a thorough discussion of the procedural risks, benefits and alternatives. All questions were addressed. A timeout was performed prior to the initiation of the procedure. The abdomen was evaluated with ultrasound. Ascites was identified around the liver. Subtle lesions throughout the liver. Hyperechoic lesion in left hepatic lobe was targeted for biopsy. The anterior and right side of the abdomen was prepped with chlorhexidine and sterile field was created. Maximal barrier sterile technique was utilized including caps, mask, sterile  gowns, sterile gloves, sterile drape, hand hygiene and skin antiseptic. Right lower abdomen was anesthetized with 1% lidocaine. A small incision was made. Using ultrasound guidance, a Safe-T-Centesis catheter was directed into perihepatic ascites. Paracentesis was performed. During the paracentesis, the left hepatic lobe was targeted for biopsy. The left upper abdomen was anesthetized with 1% lidocaine and a small incision was made. Using ultrasound guidance, 17 gauge coaxial needle was directed into the left hepatic lobe and directed into a hyperechoic lesion. Total of 4 core biopsies were obtained with an 18 gauge core device. Specimens placed in formalin. Gel-Foam slurry was injected through the 17 gauge needle as it was removed. Bandage placed at the biopsy site. Paracentesis catheter was removed. Bandage placed at the paracentesis site. FINDINGS: Liver is diffusely heterogeneous with poorly defined lesions. A hyperechoic lesion in left hepatic lobe was successfully biopsied. 1.9 L of amber colored ascites was removed. IMPRESSION: 1. Ultrasound-guided core biopsy of a left hepatic lesion. 2. Ultrasound-guided paracentesis.  1.9 L of fluid was removed. Electronically Signed   By: Markus Daft M.D.   On: 10/12/2021 16:00   US Paracentesis  Result Date: 10/12/2021 INDICATION: 65 year old with innumerable liver lesions. Findings are concerning for metastatic disease and tissue diagnosis is needed. Patient also has a large volume of ascites. EXAM: 1. Ultrasound-guided paracentesis 2. Ultrasound-guided liver lesion biopsy MEDICATIONS: Moderate sedation ANESTHESIA/SEDATION: Moderate (conscious) sedation was employed during this procedure. A total of Versed 2.0mg  and fentanyl 100 mcg was administered intravenously at the order of the provider performing the procedure. Total intra-service moderate sedation time: 26 minutes. Patient's level of consciousness and vital signs were monitored continuously by radiology nurse  throughout the procedure under the supervision of the provider performing the procedure. FLUOROSCOPY TIME:  None COMPLICATIONS: None immediate. PROCEDURE: Informed written consent was obtained from the patient after a thorough discussion of the procedural risks, benefits and alternatives. All questions were addressed. A timeout was performed prior to the initiation of the procedure. The abdomen was evaluated with ultrasound. Ascites was identified around the liver. Subtle lesions throughout the liver. Hyperechoic lesion in left hepatic lobe was targeted for biopsy. The anterior and right side of the abdomen was prepped with chlorhexidine and sterile field was created. Maximal barrier sterile technique was utilized including caps, mask, sterile gowns, sterile gloves, sterile drape, hand hygiene and skin  antiseptic. Right lower abdomen was anesthetized with 1% lidocaine. A small incision was made. Using ultrasound guidance, a Safe-T-Centesis catheter was directed into perihepatic ascites. Paracentesis was performed. During the paracentesis, the left hepatic lobe was targeted for biopsy. The left upper abdomen was anesthetized with 1% lidocaine and a small incision was made. Using ultrasound guidance, 17 gauge coaxial needle was directed into the left hepatic lobe and directed into a hyperechoic lesion. Total of 4 core biopsies were obtained with an 18 gauge core device. Specimens placed in formalin. Gel-Foam slurry was injected through the 17 gauge needle as it was removed. Bandage placed at the biopsy site. Paracentesis catheter was removed. Bandage placed at the paracentesis site. FINDINGS: Liver is diffusely heterogeneous with poorly defined lesions. A hyperechoic lesion in left hepatic lobe was successfully biopsied. 1.9 L of amber colored ascites was removed. IMPRESSION: 1. Ultrasound-guided core biopsy of a left hepatic lesion. 2. Ultrasound-guided paracentesis.  1.9 L of fluid was removed. Electronically Signed    By: Markus Daft M.D.   On: 10/12/2021 16:00     LOS: 4 days   Antonieta Pert, MD [facility]  10/14/2021, 10:14 AM

## 2021-10-14 NOTE — Care Management Important Message (Signed)
Important Message  Patient Details IM Letter not given Patient with Palliative Care. Name: Jason Mcguire MRN: [id number] Date of Birth: Sep 27, 1956   Medicare Important Message Given:  No     Kerin Salen 10/14/2021, 10:15 AM

## 2021-10-14 NOTE — Progress Notes (Signed)
Daily Progress Note   Patient Name: Jason Mcguire       Date: 10/14/2021 DOB: [birth year]  Age: 65 y.o. MRN#: [id number] Attending Physician: Antonieta Pert, MD Primary Care Physician: Kathyrn Lass, MD Admit Date: 10/07/2021  Reason for Consultation/Follow-up: Establishing goals of care and Pain control  Subjective: I saw and examined Jason Mcguire today.    He reports that pain has been better controlled overnight.  Reviewed MAR and he has had dilaudid 8mg  IV, oxycontin 20mg , and vicodin 10mg  for a total oral morphine equivalent of 200mg  of oral morphine.    We discussed options for pain management and he would like to work to transition to oral medications today.  We also discussed bowel regimen.  He reports having large bowel movement this morning.  Reviewed also regarding plan to await biopsy and potential for disease modifying therapy and how this depends on his continued progression in regard to his nutrition and functional status.  Length of Stay: 4  Current Medications: Scheduled Meds:   amphetamine-dextroamphetamine  20 mg Oral BID WC   feeding supplement  237 mL Oral BID BM   oxyCODONE  40 mg Oral Q12H   senna-docusate  2 tablet Oral BID   sodium chloride flush  3 mL Intravenous Q12H   thiamine  100 mg Oral Daily    Continuous Infusions:  sodium chloride 250 mL (10/08/21 1112)    PRN Meds: sodium chloride, acetaminophen **OR** acetaminophen, albuterol, ALPRAZolam, alum & mag hydroxide-simeth, hydrALAZINE, HYDROmorphone (DILAUDID) injection, oxyCODONE, sodium chloride flush  Physical Exam         General: Alert, awake, in no acute distress.   HEENT: No bruits, no goiter, no JVD Heart: Regular rate and rhythm. No murmur appreciated. Lungs: Good air movement,  clear Abdomen: Soft, globally tender, distended, positive bowel sounds.   Ext: No significant edema Skin: Warm and dry Neuro: Grossly intact, nonfocal.   Vital Signs: BP (!) 167/83 (BP Location: Right Arm)    Pulse 87    Temp 98 F (36.7 C) (Oral)    Resp 18    Ht 5\' 6"  (1.676 m)    Wt 53.6 kg    SpO2 97%    BMI 19.07 kg/m  SpO2: SpO2: 97 % O2 Device: O2 Device: Room Air O2 Flow Rate: O2 Flow Rate (L/min):  2 L/min  Intake/output summary:  Intake/Output Summary (Last 24 hours) at 10/14/2021 2121 Last data filed at 10/14/2021 1400 Gross per 24 hour  Intake 594 ml  Output 300 ml  Net 294 ml   LBM: Last BM Date: 10/13/21 Baseline Weight: Weight: 57.2 kg Most recent weight: Weight: 53.6 kg       Palliative Assessment/Data:    Flowsheet Rows    Flowsheet Row Most Recent Value  Intake Tab   Referral Department Hospitalist  Unit at Time of Referral Oncology Unit  Palliative Care Primary Diagnosis Cancer  Date Notified 10/12/21  Palliative Care Type New Palliative care  Reason for referral Clarify Goals of Care, Pain  Date of Admission 10/07/21  Date first seen by Palliative Care 10/13/21  # of days Palliative referral response time 1 Day(s)  # of days IP prior to Palliative referral 5  Clinical Assessment   Palliative Performance Scale Score 40%  Psychosocial & Spiritual Assessment   Palliative Care Outcomes   Patient/Family meeting held? Yes  Who was at the meeting? 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 [facility]) 10/10/2021   Attention deficit disorder with hyperactivity    Anxiety disorder    Ascites, malignant    Weight loss    Anemia    Dehydration    Elevated CK    Elevated lactic acid level    Elevated troponin    Malignant neoplasm metastatic to liver ([place]) 10/07/2021   COPD (chronic obstructive pulmonary disease) ([place]) 05/06/2011   Smoker 05/06/2011    Chronic rhinitis 05/06/2011    Palliative Care Assessment & Plan   Patient Profile: Jason Mcguire is a 65 year old male with past medical history of asthma, ADHD and anxiety who presented with shortness of breath and worsened abdominal distention for about 3 weeks.  He was found out elevated LFTs, ascites with CT abdomen pelvis and liver lesions concerning for metastatic cancer.  There are also lesions noted in lungs on imaging.  He underwent paracentesis as well as liver biopsy.  Recommendations/Plan: Pain, cancer related: Increase oxycontin to 40mg  twice daily.  Plan to start oxycodone 10mg  every 3 hours as first line pain medication.  Will also continue with IV dilaudid as second line pain medication to be given 45-60 minutes after oral medication if oral medication is insufficient to relieve pain.  Goals of Care and Additional Recommendations: Limitations on Scope of Treatment: Full Scope Treatment  Code Status:    Code Status Orders  (From admission, onward)           Start     Ordered   10/08/21 0031  Full code  Continuous        10/08/21 0031           Code Status History     This patient has a current code status but no historical code status.       Prognosis:  Unable to determine  Discharge Planning: To Be Determined  Care plan was discussed with patient, sister  Thank you for allowing the Palliative Medicine Team to assist in the care of this patient.   Time In: 1000 Time Out: 1040 Total Time 40 Prolonged Time Billed No      Greater than 50%  of this time was spent counseling and coordinating care related to the above assessment and plan.  Micheline Rough, MD  Please contact  Palliative Medicine Team phone at [phone] for questions and concerns.

## 2021-10-14 NOTE — Progress Notes (Incomplete)
Daily Progress Note   Patient Name: Jason Mcguire       Date: 10/14/2021 DOB: [birth year]  Age: 65 y.o. MRN#: [id number] Attending Physician: Antonieta Pert, MD Primary Care Physician: Kathyrn Lass, MD Admit Date: 10/07/2021  Reason for Consultation/Follow-up: {Reason for Consult:23484}  Subjective: ***  Length of Stay: 4  Current Medications: Scheduled Meds:   amphetamine-dextroamphetamine  20 mg Oral BID WC   feeding supplement  237 mL Oral BID BM   oxyCODONE  20 mg Oral Q12H   oxyCODONE  40 mg Oral Q12H   senna-docusate  2 tablet Oral BID   sodium chloride flush  3 mL Intravenous Q12H   thiamine  100 mg Oral Daily    Continuous Infusions:  sodium chloride 250 mL (10/08/21 1112)    PRN Meds: sodium chloride, acetaminophen **OR** acetaminophen, albuterol, ALPRAZolam, alum & mag hydroxide-simeth, hydrALAZINE, HYDROmorphone (DILAUDID) injection, oxyCODONE, sodium chloride flush  Physical Exam          Vital Signs: BP (!) 159/108    Pulse 84    Temp 97.6 F (36.4 C) (Oral)    Resp 16    Ht 5\' 6"  (1.676 m)    Wt 53.6 kg    SpO2 95%    BMI 19.07 kg/m  SpO2: SpO2: 95 % O2 Device: O2 Device: Room Air O2 Flow Rate: O2 Flow Rate (L/min): 2 L/min  Intake/output summary:  Intake/Output Summary (Last 24 hours) at 10/14/2021 1020 Last data filed at 10/14/2021 0500 Gross per 24 hour  Intake 240 ml  Output 300 ml  Net -60 ml   LBM: Last BM Date: 10/13/21 Baseline Weight: Weight: 57.2 kg Most recent weight: Weight: 53.6 kg       Palliative Assessment/Data:    Flowsheet Rows    Flowsheet Row Most Recent Value  Intake Tab   Referral Department Hospitalist  Unit at Time of Referral Oncology Unit  Palliative Care Primary Diagnosis Cancer  Date Notified 10/12/21   Palliative Care Type New Palliative care  Reason for referral Clarify Goals of Care, Pain  Date of Admission 10/07/21  Date first seen by Palliative Care 10/13/21  # of days Palliative referral response time 1 Day(s)  # of days IP prior to Palliative referral 5  Clinical Assessment   Palliative Performance Scale Score 40%  Psychosocial &  Spiritual Assessment   Palliative Care Outcomes   Patient/Family meeting held? Yes  Who was at the meeting? 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 [facility]) 10/10/2021   Attention deficit disorder with hyperactivity    Anxiety disorder    Ascites, malignant    Weight loss    Anemia    Dehydration    Elevated CK    Elevated lactic acid level    Elevated troponin    Malignant neoplasm metastatic to liver ([place]) 10/07/2021   COPD (chronic obstructive pulmonary disease) ([place]) 05/06/2011   Smoker 05/06/2011   Chronic rhinitis 05/06/2011    Palliative Care Assessment & Plan   Patient Profile: ***  Assessment: ***  Recommendations/Plan: Full code/full scope   Goals of Care and Additional Recommendations: Limitations on Scope of Treatment: {Recommended Scope and Preferences:21019}  Code Status:    Code Status Orders  (From admission, onward)           Start     Ordered   10/08/21 0031  Full code  Continuous        10/08/21 0031           Code Status History     This patient has a current code status but no historical code status.       Prognosis:  {Palliative Care Prognosis:23504}  Discharge Planning: {Palliative dispostion:23505}  Care plan was discussed with ***  Thank you for allowing the Palliative Medicine Team to assist in the care of this patient.   Time In: *** Time Out: *** Total Time *** Prolonged Time Billed  {YES NO:22349}       Greater than 50%  of this time was spent counseling  and coordinating care related to the above assessment and plan.  Micheline Rough, MD  Please contact Palliative Medicine Team phone at [phone] for questions and concerns.

## 2021-10-15 ENCOUNTER — Encounter (HOSPITAL_COMMUNITY): Payer: Self-pay | Admitting: Internal Medicine

## 2021-10-15 DIAGNOSIS — C786 Secondary malignant neoplasm of retroperitoneum and peritoneum: Secondary | ICD-10-CM

## 2021-10-15 DIAGNOSIS — C801 Malignant (primary) neoplasm, unspecified: Secondary | ICD-10-CM | POA: Diagnosis not present

## 2021-10-15 DIAGNOSIS — C787 Secondary malignant neoplasm of liver and intrahepatic bile duct: Secondary | ICD-10-CM | POA: Diagnosis not present

## 2021-10-15 DIAGNOSIS — D630 Anemia in neoplastic disease: Secondary | ICD-10-CM

## 2021-10-15 DIAGNOSIS — E43 Unspecified severe protein-calorie malnutrition: Secondary | ICD-10-CM | POA: Insufficient documentation

## 2021-10-15 DIAGNOSIS — R180 Malignant ascites: Secondary | ICD-10-CM | POA: Diagnosis not present

## 2021-10-15 LAB — CBC WITH DIFFERENTIAL/PLATELET
Abs Immature Granulocytes: 0.04 10*3/uL (ref 0.00–0.07)
Basophils Absolute: 0 10*3/uL (ref 0.0–0.1)
Basophils Relative: 0 %
Eosinophils Absolute: 0.1 10*3/uL (ref 0.0–0.5)
Eosinophils Relative: 1 %
HCT: 34.4 % — ABNORMAL LOW (ref 39.0–52.0)
Hemoglobin: 10.5 g/dL — ABNORMAL LOW (ref 13.0–17.0)
Immature Granulocytes: 0 %
Lymphocytes Relative: 9 %
Lymphs Abs: 1.1 10*3/uL (ref 0.7–4.0)
MCH: 25.4 pg — ABNORMAL LOW (ref 26.0–34.0)
MCHC: 30.5 g/dL (ref 30.0–36.0)
MCV: 83.3 fL (ref 80.0–100.0)
Monocytes Absolute: 0.9 10*3/uL (ref 0.1–1.0)
Monocytes Relative: 8 %
Neutro Abs: 9.2 10*3/uL — ABNORMAL HIGH (ref 1.7–7.7)
Neutrophils Relative %: 82 %
Platelets: 520 10*3/uL — ABNORMAL HIGH (ref 150–400)
RBC: 4.13 MIL/uL — ABNORMAL LOW (ref 4.22–5.81)
RDW: 19.5 % — ABNORMAL HIGH (ref 11.5–15.5)
WBC: 11.3 10*3/uL — ABNORMAL HIGH (ref 4.0–10.5)
nRBC: 0 % (ref 0.0–0.2)

## 2021-10-15 LAB — BODY FLUID CULTURE W GRAM STAIN: Culture: NO GROWTH

## 2021-10-15 MED ORDER — SODIUM CHLORIDE 0.9 % IV SOLN: INTRAVENOUS | Status: DC

## 2021-10-15 MED ORDER — PEG 3350-KCL-NA BICARB-NACL 420 G PO SOLR
4000.0000 mL | Freq: Once | ORAL | Status: AC
  Administered 2021-10-15: 16:00:00 4000 mL via ORAL
  Filled 2021-10-15: qty 4000

## 2021-10-15 NOTE — Progress Notes (Signed)
Daily Progress Note   Patient Name: BILLIE INTRIAGO       Date: 10/15/2021 DOB: 1956-10-12  Age: 65 y.o. MRN#: [id number] Attending Physician: Antonieta Pert, MD Primary Care Physician: Kathyrn Lass, MD Admit Date: 10/07/2021  Reason for Consultation/Follow-up: Establishing goals of care and Pain control  Subjective: I saw and examined Mr. Middlebrooks today.    He reports being overwhelmed today that he has likely metastatic colon cancer.  States that he is upset with himself as he had previously had appointments to get scheduled for screening colonoscopy but, "something came up each time so I never went."  We also discussed multiple stressors in his life including stress associated with significant other and things he wants to do in order to try to minimize his stress levels moving forward.  We discussed his pain management regimen and he feels as though current regimen has been doing a fairly good job of managing his pain.  After reviewing his usage overnight, we discussed plan to continue with pain regimen today and monitor closely.  If he requires multiple doses of breakthrough medication, will make further adjustments to his oral regimen.  Also consideration for increasing his long-acting dose based upon his continued need for short acting rescue doses.  Length of Stay: 5  Current Medications: Scheduled Meds:   amphetamine-dextroamphetamine  20 mg Oral BID WC   feeding supplement  237 mL Oral BID BM   oxyCODONE  40 mg Oral Q12H   polyethylene glycol-electrolytes  4,000 mL Oral ONCE-1600   senna-docusate  2 tablet Oral BID   sodium chloride flush  3 mL Intravenous Q12H   thiamine  100 mg Oral Daily    Continuous Infusions:  sodium chloride 250 mL (10/08/21 1112)    PRN Meds: sodium  chloride, acetaminophen **OR** acetaminophen, albuterol, ALPRAZolam, alum & mag hydroxide-simeth, hydrALAZINE, HYDROmorphone (DILAUDID) injection, oxyCODONE, sodium chloride flush  Physical Exam         General: Alert, awake, in no acute distress.   HEENT: No bruits, no goiter, no JVD Heart: Regular rate and rhythm. No murmur appreciated. Lungs: Good air movement, clear Abdomen: Soft, globally tender, distended, positive bowel sounds.   Ext: No significant edema Skin: Warm and dry Neuro: Grossly intact, nonfocal.   Vital Signs: BP (!) 153/89 (BP Location: Right Arm)  Pulse 85    Temp 98.1 F (36.7 C) (Oral)    Resp 16    Ht 5\' 6"  (1.676 m)    Wt 53.6 kg    SpO2 95%    BMI 19.07 kg/m  SpO2: SpO2: 95 % O2 Device: O2 Device: Room Air O2 Flow Rate: O2 Flow Rate (L/min): 2 L/min  Intake/output summary:  Intake/Output Summary (Last 24 hours) at 10/15/2021 1456 Last data filed at 10/15/2021 1015 Gross per 24 hour  Intake 240 ml  Output 350 ml  Net -110 ml    LBM: Last BM Date: 10/14/21 Baseline Weight: Weight: 57.2 kg Most recent weight: Weight: 53.6 kg       Palliative Assessment/Data:    Flowsheet Rows    Flowsheet Row Most Recent Value  Intake Tab   Referral Department Hospitalist  Unit at Time of Referral Oncology Unit  Palliative Care Primary Diagnosis Cancer  Date Notified 10/12/21  Palliative Care Type New Palliative care  Reason for referral Clarify Goals of Care, Pain  Date of Admission 10/07/21  Date first seen by Palliative Care 10/13/21  # of days Palliative referral response time 1 Day(s)  # of days IP prior to Palliative referral 5  Clinical Assessment   Palliative Performance Scale Score 40%  Psychosocial & Spiritual Assessment   Palliative Care Outcomes   Patient/Family meeting held? Yes  Who was at the meeting? 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 Cabiness is a 65 year old male with past medical history of asthma, ADHD and anxiety who presented with shortness of breath and worsened abdominal distention for about 3 weeks.  He was found out elevated LFTs, ascites with CT abdomen pelvis and liver lesions concerning for metastatic cancer.  There are also lesions noted in lungs on imaging.  He underwent paracentesis as well as liver biopsy.  Recommendations/Plan: Pain, cancer related: He reports that overall pain is better controlled on current regimen.  He did require 1 dose of IV pain medication this morning for breakthrough pain.  Otherwise, MAR reveals that he has had 4 doses of breakthrough oral pain medication (10 mg of oxycodone).  Continue same for now.  This includes:   Oxycontin to 40mg  twice daily.   Oxycodone 10mg  every 3 hours as first line pain medication.    Dilaudid 1mg  IV as second line pain medication to be given 45-60 minutes after oral medication if oral medication is insufficient to relieve pain. Await input from oncology.  He is currently invested in plan to pursue any recommended modifying therapy.  Goals of Care and Additional Recommendations: Limitations on Scope of Treatment: Full Scope Treatment  Code Status:    Code Status Orders  (From admission, onward)           Start     Ordered  10/08/21 0031  Full code  Continuous        10/08/21 0031           Code Status History     This  patient has a current code status but no historical code status.       Prognosis:  Unable to determine  Discharge Planning: To Be Determined  Care plan was discussed with patient Thank you for allowing the Palliative Medicine Team to assist in the care of this patient.   Time In: 0940 Time Out: 1020 Total Time 40 Prolonged Time Billed No   Greater than 50%  of this time was spent counseling and coordinating care related to the above assessment and plan.   Micheline Rough, MD  Please contact Palliative Medicine Team phone at [phone] for questions and concerns.
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
## 2021-10-15 NOTE — Progress Notes (Signed)
PROGRESS NOTE    DAILY CRATE  NLG:[id number] DOB: 09/19/1956 DOA: 10/07/2021 PCP: Kathyrn Lass, MD   Chief Complaint  Patient presents with   Abdominal Pain   Shortness of Breath  Brief Narrative/Hospital Course: Jason Mcguire, 65 y.o. male with PMH of asthma, ADHD, presented with SOB, worsening abdominal distension ongoing for about 3 weeks PTA.Hx of heavy alcohol abuse when he was younger, currently drinks socially. In the ED, noted to have elevated LFTs, ascites with CT abd/pelvis showing liver lesions concerning for metastatic cancer.   Patient was admitted underwent further work-up.  He had paracentesis on 12/23 that showed no signs of infection.Underwent image guided liver biopsy and repeat paracentesis 12/27 Patient continues to have abdominal distention pain issues and being managed with pain management.  Palliative care following. 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  Complains of ongoing pain  On room air.  Overnight afebrile. WBC count downtrending  Assessment & Plan:  Metastatic liver masses consistent with adenocarcinoma colorectal distended standing Diffuse innumerable bilobar hepatic lesions Abdominal adenopathy large volume ascites-recurrent-most likely metastatic Abnormal LFTs Multiple solid bilateral pulmonary nodules: Presentation with adenopathy ascites pulmonary nodule and liver lesions-underwent abdominal paracentesis cytology with reactive cell and subsequent paracentesis again on 12/27 with liver biopsy and biopsy consistent with metastatic disease.  With ongoing ascites abdominal pain.  Palliative care following overall prognosis poor.  Oncology consulted today to see if there is any other options versus hospice  Pain management Cancer related pain: Continue to adjust the pain  regimen with OxyContin as needed oral/IV opiates, palliative care following closely and appreciate input.    Centrilobular emphysema noted in the CT chest.  Leukocytosis so far no evidence of infection on UA chest x-ray and initial paracentesis, blood culture negative x2 although procalcitonin elevated-which could be in the setting of metastatic disease.  WBC count downtrending.  Recent Labs  Lab 10/11/21 0536 10/11/21 0813 10/11/21 0906 10/12/21 0520 10/13/21 0537 10/14/21 0754 10/15/21 0552  WBC 15.7*  --   --  14.6* 12.7* 15.0* 11.3*  LATICACIDVEN  --  1.9 2.0*  --   --   --   --   PROCALCITON  --  2.36  --  2.52 6.96  --   --      Iron-deficiency anemia likely from malignancy monitor hb  Rectal bleeding: Patient had episodes of bleeding at the end of bowel movement per nursing staff.  Patient has remained stable, will hold off on resuming DVT prophylaxis.  Suspect this could be due to his colorectal carcinoma although CT scan of the abdomen no intestinal finding  but wo contrast Recent Labs  Lab 10/12/21 0520 10/13/21 0537 10/13/21 2000 10/14/21 0754 10/15/21 0552  HGB 11.0* 10.5* 10.6* 12.6* 10.5*  HCT 35.6* 33.7* 33.9* 42.7 34.4*     Elevated TSH with normal free T4 outpatient follow-up in 3 weeks  Hyponatremia monitor bmp  Anxiety disorder ADHD: Mood still anxious requesting to increase Xanax . Cont adderal.  Goals of care: Chaplain consulted sister at the bedside, Perative care is on board.  Appreciate input from pain management.  Severe malnutrition Nutrition Problem: Severe Malnutrition Etiology: chronic illness Signs/Symptoms: severe fat depletion, severe muscle depletion, energy intake < or equal to 75% for > or equal to 1 month Interventions: Ensure Enlive (each supplement provides 350kcal and 20 grams of protein)   Significant weight  loss 61.2 in Sept to  > 53.6  kg-suspected due to malignancy, severe protein malnutrition.  Supplement diet RD eval     DVT prophylaxis: SCDs Start: 10/08/21 0031.  Lovenox held 2/2 biopsy and rectal bleed Code Status:   Code Status: Full Code Family Communication: plan of care discussed with patient and his ssister  at bedside previously, no family at the bedside this morning.  Status is: Inpatient Remains inpatient appropriate because: For ongoing management of abdominal pain ascites and biopsy result Disposition: Currently not medically stable for discharge. Anticipated Disposition: TBD   Objective: Vitals last 24 hrs: Vitals:   10/14/21 0804 10/14/21 1243 10/14/21 2024 10/15/21 0428  BP: (!) 159/108 114/73 (!) 167/83 (!) 153/89  Pulse: 84 91 87 85  Resp:  16 18 16   Temp: 97.6 F (36.4 C) (!) 97.3 F (36.3 C) 98 F (36.7 C) 98.1 F (36.7 C)  TempSrc: Oral Oral Oral Oral  SpO2: 95% 97% 97% 95%  Weight:      Height:       Weight change:   Intake/Output Summary (Last 24 hours) at 10/15/2021 1144 Last data filed at 10/15/2021 1015 Gross per 24 hour  Intake 597 ml  Output 350 ml  Net 247 ml    Net IO Since Admission: -738.09 mL [10/15/21 1144]   Physical Examination: General exam: AAOx 3 and cachectic frail HEENT:Oral mucosa moist, Ear/Nose WNL grossly, dentition normal. Respiratory system: bilaterally diminished at the lung base of the long, no use of accessory muscle Cardiovascular system: S1 & S2 +, No JVD,. Gastrointestinal system: Abdomen moderately distended diffuse mild tenderness, BS+ Nervous System:Alert, awake, moving extremities and grossly nonfocal Extremities: mild edema, distal peripheral pulses palpable.  Skin: No rashes,no icterus. MSK: Normal muscle bulk,tone, power   Medications reviewed:  Scheduled Meds:  amphetamine-dextroamphetamine  20 mg Oral BID WC   feeding supplement  237 mL Oral BID BM   oxyCODONE  40 mg Oral Q12H   senna-docusate  2 tablet Oral BID   sodium chloride flush  3 mL Intravenous Q12H   thiamine  100 mg Oral Daily   Continuous  Infusions:  sodium chloride 250 mL (10/08/21 1112)    Diet Order             Diet 2 gram sodium Room service appropriate? Yes; Fluid consistency: Thin  Diet effective now                   Nutrition Problem: Severe Malnutrition Etiology: chronic illness Signs/Symptoms: severe fat depletion, severe muscle depletion, energy intake < or equal to 75% for > or equal to 1 month Interventions: Ensure Enlive (each supplement provides 350kcal and 20 grams of protein)  Weight change:   Wt Readings from Last 3 Encounters:  10/07/21 53.6 kg  01/25/19 59 kg  06/20/13 61.2 kg  Consultants:see note  Procedures:see note Antimicrobials: Anti-infectives (From admission, onward)    None      Culture/Microbiology    Component Value Date/Time   SDES  10/12/2021 1511    PERITONEAL Performed at [facility], [place] [address] [place], [place] [address]    SPECREQUEST  10/12/2021 1511    NONE Performed at [facility], [place] [address], [place], [place] [address]    CULT  10/12/2021 1511    NO GROWTH 3 DAYS Performed at [facility] [address], [place], [place] [address]    REPTSTATUS 10/15/2021 FINAL 10/12/2021 1511    Other  culture-see note  Unresulted Labs (From admission, onward)     Start     Ordered   10/13/21 1455  Occult blood card to lab, stool  ONCE - STAT,   STAT        10/13/21 1458   10/10/21 0500  CBC with Differential/Platelet  Daily,   R     Question:  Specimen collection method  Answer:  Lab=Lab collect   10/09/21 1744   Unscheduled  Occult blood card to lab, stool  As needed,   R      10/08/21 0249          Data Reviewed: I have personally reviewed following labs and imaging studies CBC: Recent Labs  Lab 10/11/21 0536 10/12/21 0520 10/13/21 0537 10/13/21 2000 10/14/21 0754 10/15/21 0552  WBC 15.7* 14.6* 12.7*  --  15.0* 11.3*  NEUTROABS 13.3* 12.5* 11.1*  --  13.1* 9.2*  HGB 11.7* 11.0* 10.5* 10.6*  12.6* 10.5*  HCT 37.9* 35.6* 33.7* 33.9* 42.7 34.4*  MCV 82.0 81.7 82.4  --  86.8 83.3  PLT 513* 543* 333  --  444* 520*    Basic Metabolic Panel: Recent Labs  Lab 10/10/21 0639 10/11/21 0536 10/12/21 0520 10/13/21 0537 10/14/21 0754  NA 135 135 135 130* 134*  K 3.9 3.9 4.5 4.6 4.3  CL 98 98 98 97* 95*  CO2 28 26 27 25 27   GLUCOSE 92 90 89 115* 90  BUN 13 11 14 16 16   CREATININE 0.60* 0.57* 0.54* 0.69 0.58*  CALCIUM 8.2* 8.4* 8.6* 8.2* 8.7*    GFR: Estimated Creatinine Clearance: 69.8 mL/min (A) (by C-G formula based on SCr of 0.58 mg/dL (L)). Liver Function Tests: Recent Labs  Lab 10/10/21 [phone] 10/11/21 0536 10/12/21 0520 10/13/21 0537 10/14/21 0754  AST 76* 89* 329* 323* 125*  ALT 24 27 61* 65* 46*  ALKPHOS 431* 406* 414* 457* 457*  BILITOT 0.7 0.8 1.1 1.0 1.1  PROT 6.3* 6.5 6.8 6.3* 6.8  ALBUMIN 2.4* 2.4* 2.5* 2.3* 2.3*    No results for input(s): LIPASE, AMYLASE in the last 168 hours.  No results for input(s): AMMONIA in the last 168 hours.  Coagulation Profile: Recent Labs  Lab 10/12/21 0520  INR 1.2    Cardiac Enzymes: No results for input(s): CKTOTAL, CKMB, CKMBINDEX, TROPONINI in the last 168 hours.  BNP (last 3 results) No results for input(s): PROBNP in the last 8760 hours. HbA1C: No results for input(s): HGBA1C in the last 72 hours. CBG: No results for input(s): GLUCAP in the last 168 hours. Lipid Profile: No results for input(s): CHOL, HDL, LDLCALC, TRIG, CHOLHDL, LDLDIRECT in the last 72 hours. Thyroid Function Tests: No results for input(s): TSH, T4TOTAL, FREET4, T3FREE, THYROIDAB in the last 72 hours. Anemia Panel: No results for input(s): VITAMINB12, FOLATE, FERRITIN, TIBC, IRON, RETICCTPCT in the last 72 hours. Sepsis Labs: Recent Labs  Lab 10/11/21 0813 10/11/21 0906 10/12/21 0520 10/13/21 0537  PROCALCITON 2.36  --  2.52 6.96  LATICACIDVEN 1.9 2.0*  --   --      Recent Results (from the past 240 hour(s))  Resp Panel  by RT-PCR (Flu A&B, Covid) Nasopharyngeal Swab     Status: None   Collection Time: 10/07/21  6:19 PM   Specimen: Nasopharyngeal Swab; Nasopharyngeal(NP) swabs in vial transport medium  Result Value Ref Range Status   SARS Coronavirus 2 by RT PCR NEGATIVE NEGATIVE Final    Comment: (NOTE) SARS-CoV-2 target nucleic acids are NOT DETECTED.  The SARS-CoV-2 RNA is generally detectable in upper respiratory specimens during the acute phase of infection. The lowest concentration of SARS-CoV-2 viral copies this assay can detect is 138 copies/mL. A negative result does not preclude SARS-Cov-2 infection and should not be used as the sole basis for treatment or other patient management decisions. A negative result may occur with  improper specimen collection/handling, submission of specimen other than nasopharyngeal swab, presence of viral mutation(s) within the areas targeted by this assay, and inadequate number of viral copies(<138 copies/mL). A negative result must be combined with clinical observations, patient history, and epidemiological information. The expected result is Negative.  Fact Sheet for Patients:  EntrepreneurPulse.com.au  Fact Sheet for Healthcare Providers:  IncredibleEmployment.be  This test is no t yet approved or cleared by the [place] FDA and  has been authorized for detection and/or diagnosis of SARS-CoV-2 by FDA under an Emergency Use Authorization (EUA). This EUA will remain  in effect (meaning this test can be used) for the duration of the COVID-19 declaration under Section 564(b)(1) of the Act, 21 U.S.C.section 360bbb-3(b)(1), unless the authorization is terminated  or revoked sooner.       Influenza A by PCR NEGATIVE NEGATIVE Final   Influenza B by PCR NEGATIVE NEGATIVE Final    Comment: (NOTE) The Xpert Xpress SARS-CoV-2/FLU/RSV plus assay is intended as an aid in the diagnosis of influenza from Nasopharyngeal swab  specimens and should not be used as a sole basis for treatment. Nasal washings and aspirates are unacceptable for Xpert Xpress SARS-CoV-2/FLU/RSV testing.  Fact Sheet for Patients: EntrepreneurPulse.com.au  Fact Sheet for Healthcare Providers: IncredibleEmployment.be  This test is not yet approved or cleared by the [place] FDA and has been authorized for detection and/or diagnosis of SARS-CoV-2 by FDA under an Emergency Use Authorization (EUA). This EUA will remain in effect (meaning this test can be used) for the duration of the COVID-19 declaration under Section 564(b)(1) of the Act, 21 U.S.C. section 360bbb-3(b)(1), unless the authorization is terminated or revoked.  Performed at [facility], [address], [place], [place] [address]   Urine Culture     Status: None   Collection Time: 10/07/21  8:10 PM   Specimen: Urine, Clean Catch  Result Value Ref Range Status   Specimen Description   Final    URINE, CLEAN CATCH Performed at [facility], [place] [address] [place], [place] [address]    Special Requests   Final    NONE Performed at [facility], [place] [place], [place] [address]    Culture   Final    NO GROWTH Performed at [facility], [place] [address], [place], [place] [address]    Report Status 10/09/2021 FINAL  Final  Body fluid culture w Gram Stain     Status: None   Collection Time: 10/08/21 10:38 AM   Specimen: PATH Cytology Peritoneal fluid  Result Value Ref Range Status   Specimen Description   Final    PERITONEAL Performed at [facility] [address] [place], [place] [address]    Special Requests   Final    NONE Performed at [facility], [place] [address] [place], [place] [address]    Gram Stain NO WBC SEEN NO ORGANISMS SEEN   Final   Culture   Final    NO GROWTH 3 DAYS Performed at [facility], [place] [address], [place], [place]  [address]    Report Status 10/11/2021 FINAL  Final  Culture, blood (routine x 2)     Status: None (Preliminary result)   Collection Time: 10/11/21  8:12 AM   Specimen: BLOOD  Result Value Ref Range Status   Specimen Description   Final    BLOOD BLOOD RIGHT HAND Performed at [facility] [address], [place], [place] [address]    Special Requests   Final    BOTTLES DRAWN AEROBIC ONLY Blood Culture adequate volume Performed at [facility] [address], [place], [place] [address]    Culture   Final    NO GROWTH 4 DAYS Performed at [facility], [place] [address] [place], [place] [address]    Report Status PENDING  Incomplete  Culture, blood (routine x 2)     Status: None (Preliminary result)   Collection Time: 10/11/21  8:15 AM   Specimen: BLOOD  Result Value Ref Range Status   Specimen Description   Final    BLOOD RIGHT ANTECUBITAL Performed at [facility] [address], [place], [place] [address]    Special Requests   Final    BOTTLES DRAWN AEROBIC ONLY Blood Culture adequate volume Performed at [facility] [address], [place], [place] [address]    Culture   Final    NO GROWTH 4 DAYS Performed at [facility], [place] [address] [place], [place] [address]    Report Status PENDING  Incomplete  Urine Culture     Status: Abnormal   Collection Time: 10/11/21 11:10 AM   Specimen: Urine, Clean Catch  Result Value Ref Range Status   Specimen Description   Final    URINE, CLEAN CATCH Performed at [facility], [place] [address] [place], [place] [address]    Special Requests   Final    NONE Performed at [facility], [place] [address], [place], [place] [address]    Culture (A)  Final    <10,000 COLONIES/mL INSIGNIFICANT GROWTH Performed at [facility] [address], [place], [place] [address]    Report Status  10/12/2021 FINAL  Final  Fungus Culture With Stain     Status: None (Preliminary result)   Collection Time: 10/12/21  3:11 PM   Specimen: PATH Cytology Peritoneal fluid  Result Value Ref Range Status   Fungus Stain Final report  Final    Comment: (NOTE) Performed At: [facility] [address] [place], [place] [id number] Rush Farmer MD OT:[id number]    Fungus (Mycology) Culture PENDING  Incomplete   Fungal Source PERITONEAL  Final    Comment: Performed at [facility], [place] [address], [place], [place] [address]  Body fluid culture w Gram Stain     Status: None   Collection Time: 10/12/21  3:11 PM   Specimen: PATH Cytology Peritoneal fluid  Result Value Ref Range Status   Specimen Description   Final    PERITONEAL Performed at [facility] [address], [place], [place] [address]    Special Requests   Final    NONE Performed at [facility], [place] [address] [place], [place] [address]    Gram Stain   Final    FEW WBC PRESENT, PREDOMINANTLY MONONUCLEAR NO ORGANISMS SEEN    Culture   Final    NO GROWTH 3 DAYS Performed at [facility] [address], [place], [place]  [address]    Report Status 10/15/2021 FINAL  Final  Fungus Culture Result     Status: None   Collection Time: 10/12/21  3:11 PM  Result Value Ref Range Status   Result 1 Comment  Final    Comment: (NOTE) KOH/Calcofluor preparation:  no fungus observed. Performed At: [facility] [place], [place] [id number] Rush Farmer MD OE:[id number]       Radiology Studies: No results found.   LOS: 5 days   Antonieta Pert, MD [facility]  10/15/2021, 11:44 AM

## 2021-10-15 NOTE — Progress Notes (Signed)
PT Cancellation Note  Patient Details Name: Jason Mcguire MRN: [id number] DOB: 1955-10-27   Cancelled Treatment:    Reason Eval/Treat Not Completed: Other (comment) Pt doing colonscopy prep and declined PT.  Will f/u at later date. Abran Richard, PT Acute Rehab Services Pager [phone] [facility] [place] 10/15/2021, 4:54 PM

## 2021-10-15 NOTE — H&P (Addendum)
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         Diastology LVIDs:         2.30 cm         LV e' medial:    5.33 cm/s LV PW:         1.20 cm         LV E/e' medial:  8.7 LV IVS:        1.20 cm         LV e' lateral:   7.51 cm/s LVOT diam:     1.90 cm         LV E/e' lateral: 6.2 LV SV:         51 LV SV Index:   32 LVOT Area:     2.84 cm        3D Volume EF                                LV 3D EF:    Left                                             ventricul                                             ar                                             ejection  fraction                                             by 3D                                             volume is                                             62 %.                                 3D Volume EF:                                3D EF:        62 %                                LV EDV:       111 ml                                LV ESV:       42 ml                                LV SV:        69 ml RIGHT VENTRICLE RV S prime:     14.50 cm/s TAPSE (M-mode): 2.2 cm LEFT ATRIUM             Index        RIGHT ATRIUM          Index LA diam:         3.30 cm 2.06 cm/m   RA Area:     8.60 cm LA Vol (A2C):   59.0 ml 36.87 ml/m  RA Volume:   13.50 ml 8.44 ml/m LA Vol (A4C):   39.6 ml 24.76 ml/m LA Biplane Vol: 46.9 ml 29.33 ml/m  AORTIC VALVE LVOT Vmax:   85.70 cm/s LVOT Vmean:  46.500 cm/s LVOT VTI:    0.181 m AI PHT:      411 msec  AORTA Ao Root diam: 3.30 cm Ao Asc diam:  3.00 cm MITRAL VALVE MV Area (PHT): 1.85 cm    SHUNTS MV Decel Time: 409 msec    Systemic VTI:  0.18 m MV E velocity: 46.60 cm/s  Systemic Diam: 1.90 cm MV A velocity: 91.70 cm/s MV E/A ratio:  0.51 Mihai Croitoru MD Electronically signed by Sanda Klein MD Signature Date/Time: 10/08/2021/3:24:42 PM    Final     Labs:  CBC: Recent Labs    10/12/21 0520 10/13/21 0537 10/13/21 2000 10/14/21 0754 10/15/21 0552  WBC 14.6* 12.7*  --  15.0* 11.3*  HGB 11.0* 10.5* 10.6* 12.6* 10.5*  HCT 35.6* 33.7* 33.9* 42.7 34.4*  PLT 543* 333  --  444* 520*    COAGS: Recent Labs    10/07/21 1819 10/12/21 0520  INR 1.1 1.2    BMP: Recent Labs    10/11/21 0536 10/12/21 0520 10/13/21 0537 10/14/21 0754  NA 135 135 130* 134*  K 3.9 4.5 4.6 4.3  CL 98 98 97* 95*  CO2 26 27 25 27   GLUCOSE 90 89 115* 90  BUN 11 14 16 16   CALCIUM 8.4* 8.6* 8.2* 8.7*  CREATININE 0.57* 0.54* 0.69 0.58*  GFRNONAA >60 >60 >60 >60    LIVER FUNCTION TESTS: Recent Labs    10/11/21 0536 10/12/21 0520 10/13/21 0537 10/14/21 0754  BILITOT 0.8 1.1 1.0 1.1  AST 89* 329* 323* 125*  ALT 27 61* 65* 46*  ALKPHOS 406* 414* 457* 457*  PROT 6.5 6.8 6.3* 6.8  ALBUMIN 2.4* 2.5* 2.3* 2.3*    TUMOR MARKERS: No results for input(s): AFPTM, CEA, CA199, CHROMGRNA in the last 8760 hours.  Assessment and Plan: 65 y.o. male with recent diagnosis of metastatic colorectal adenocarcinoma biopsy proven on 10/12/21. Oncology was consulted and recommended PAC placement.   The procedure is tentatively scheduled for next Wednesay, pending IR schedule.   Made NPO Wed MN  VSS, afebrile  Persistent  leukocytosis, has no s/s acute infection; UA, peritoneal fluid, blood cx all negative so far  Not on AC/AP  Risks and benefits of image guided port-a-catheter placement was discussed with the patient including, but not limited to bleeding, infection, pneumothorax, or fibrin sheath development and need for additional procedures.  All of the patient's questions were answered, patient is agreeable to proceed. Consent signed and in chart.    Thank you for this interesting consult.  I greatly enjoyed meeting SANJITH SIWEK and look forward to participating in their care.  A copy of this report was sent to the requesting provider on this date.  Electronically Signed: Tera Mater, PA-C 10/15/2021, 3:43 PM   I spent a total of 30 min   in face to face in clinical consultation, greater than 50% of which was counseling/coordinating care for Pac   This chart was dictated using voice recognition software.  Despite best efforts to proofread,  errors can occur which can change the documentation meaning.

## 2021-10-15 NOTE — Consult Note (Signed)
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 3D Echo, Cardiac Doppler, Color Doppler and Strain Analysis Indications:    Elevated Troponin  History:        Patient has no prior history of Echocardiogram examinations.                 Signs/Symptoms:Shortness of Breath. Bilateral lower extremity                 edema.  Sonographer:    Darlina Sicilian RDCS Referring Phys: [place]  1. Left ventricular ejection fraction, by estimation, is 60 to 65%. Left ventricular ejection fraction by 3D volume is 62 %. The left ventricle has normal function. The left ventricle has no regional wall motion abnormalities. There is mild concentric left ventricular hypertrophy. Left ventricular diastolic parameters are consistent with Grade I diastolic dysfunction (impaired relaxation).  2. Right ventricular systolic function is normal. The right ventricular size is normal.  3. The mitral valve is normal in structure. No evidence of mitral valve regurgitation. No evidence of mitral stenosis. Moderate mitral annular calcification.  4. The aortic valve is tricuspid. There is mild calcification of the aortic valve. There is mild thickening of the aortic valve.  Aortic valve regurgitation is trivial. Aortic valve sclerosis is present, with no evidence of aortic valve stenosis.  5. The inferior vena cava is normal in size with greater than 50% respiratory variability, suggesting right atrial pressure of 3 mmHg. FINDINGS  Left Ventricle: Left ventricular ejection fraction, by estimation, is 60 to 65%. Left ventricular ejection fraction by 3D volume is 62 %. The left ventricle has normal function. The left ventricle has no regional wall motion  abnormalities. Global longitudinal strain performed but not reported based on interpreter judgement due to suboptimal tracking. The left ventricular internal cavity size was normal in size. There is mild concentric left ventricular hypertrophy. Left ventricular diastolic parameters are consistent with Grade I diastolic dysfunction (impaired relaxation). Normal left ventricular filling pressure. Right Ventricle: The right ventricular size is normal. No increase in right ventricular wall thickness. Right ventricular systolic function is normal. Left Atrium: Left atrial size was normal in size. Right Atrium: Right atrial size was normal in size. Pericardium: There is no evidence of pericardial effusion. Mitral Valve: The mitral valve is normal in structure. Moderate mitral annular calcification. No evidence of mitral valve regurgitation. No evidence of mitral valve stenosis. Tricuspid Valve: The tricuspid valve is normal in structure. Tricuspid valve regurgitation is not demonstrated. No evidence of tricuspid stenosis. Aortic Valve: The aortic valve is tricuspid. There is mild calcification of the aortic valve. There is mild thickening of the aortic valve. Aortic valve regurgitation is trivial. Aortic regurgitation PHT measures 411 msec. Aortic valve sclerosis is present, with no evidence of aortic valve stenosis. Pulmonic Valve: The pulmonic valve was normal in structure. Pulmonic valve regurgitation is not visualized. No evidence of  pulmonic stenosis. Aorta: The aortic root is normal in size and structure. Venous: The inferior vena cava is normal in size with greater than 50% respiratory variability, suggesting right atrial pressure of 3 mmHg. IAS/Shunts: No atrial level shunt detected by color flow Doppler.  LEFT VENTRICLE PLAX 2D LVIDd:         3.80 cm         Diastology LVIDs:         2.30 cm         LV e' medial:    5.33 cm/s LV PW:         1.20 cm         LV E/e' medial:  8.7 LV IVS:        1.20 cm         LV e' lateral:   7.51 cm/s LVOT diam:     1.90 cm         LV E/e' lateral: 6.2 LV SV:         51 LV SV Index:   32 LVOT Area:     2.84 cm        3D Volume EF                                LV 3D EF:    Left                                             ventricul                                             ar                                             ejection  fraction                                             by 3D                                             volume is                                             62 %.                                 3D Volume EF:                                3D EF:        62 %                                LV EDV:       111 ml                                LV ESV:       42 ml                                LV SV:        69 ml RIGHT VENTRICLE RV S prime:     14.50 cm/s TAPSE (M-mode): 2.2 cm LEFT ATRIUM             Index        RIGHT ATRIUM          Index LA diam:        3.30 cm 2.06 cm/m   RA Area:     8.60 cm LA Vol (A2C):   59.0 ml 36.87 ml/m  RA Volume:   13.50 ml 8.44 ml/m LA Vol (A4C):   39.6 ml 24.76 ml/m LA Biplane Vol: 46.9 ml 29.33 ml/m  AORTIC VALVE LVOT Vmax:   85.70 cm/s LVOT Vmean:  46.500 cm/s LVOT VTI:    0.181 m AI PHT:      411 msec  AORTA Ao Root diam: 3.30 cm Ao Asc diam:  3.00 cm MITRAL VALVE MV Area (PHT): 1.85 cm    SHUNTS MV Decel Time: 409 msec    Systemic VTI:  0.18 m MV E velocity: 46.60 cm/s  Systemic Diam: 1.90 cm MV A  velocity: 91.70 cm/s MV E/A ratio:  0.51 Mihai Croitoru MD Electronically signed by Sanda Klein MD Signature Date/Time: 10/08/2021/3:24:42 PM    Final     ASSESSMENT & PLAN:  65 yo male with   Newly diagnosed metastatic adenocarcinoma to liver, peritoneum and possible loss.  Likely lower rectum primary. Malignant ascites Protein and calorie malnutrition ADHD  Anemia of chronic disease (cancer)  Recommendations: -I reviewed his imaging findings  and the liver biopsy results with patient and his sister in detail, liver biopsy is consistent with colorectal primary  -Exam showed a low rectal mass, likely the primary. -I have called GI Dr. Therisa Doyne for colonoscopy versus sigmoidoscopy and a biopsy to confirm the primary since it did not show on CT scan  -I discussed overall prognosis, the diffuse metastasis, malignant ascites, and a slow performance status.  He understands this is not curable disease, but treatable.  He is interested in treatment. -I will request IR port placement if they can accommodate before discharge -will request MMR and FO on his biopsy sample  -I will see him back after hospital discharge to start him on chemotherapy, likely FOLFOX.    All questions were answered. The patient knows to call the clinic with any problems, questions or concerns.      Truitt Merle, MD 10/15/2021 11:09 AM

## 2021-10-16 ENCOUNTER — Inpatient Hospital Stay (HOSPITAL_COMMUNITY): Payer: PPO | Admitting: Certified Registered Nurse Anesthetist

## 2021-10-16 ENCOUNTER — Encounter (HOSPITAL_COMMUNITY)
Admission: EM | Disposition: A | Discharge status: Skilled Nursing Facility | Payer: Self-pay | Source: Home / Self Care | Attending: Internal Medicine

## 2021-10-16 ENCOUNTER — Encounter (HOSPITAL_COMMUNITY): Payer: Self-pay | Admitting: Internal Medicine

## 2021-10-16 HISTORY — PX: BIOPSY: SHX5522

## 2021-10-16 HISTORY — PX: COLONOSCOPY WITH PROPOFOL: SHX5780

## 2021-10-16 LAB — CBC WITH DIFFERENTIAL/PLATELET
Abs Immature Granulocytes: 0.04 10*3/uL (ref 0.00–0.07)
Basophils Absolute: 0 10*3/uL (ref 0.0–0.1)
Basophils Relative: 0 %
Eosinophils Absolute: 0.1 10*3/uL (ref 0.0–0.5)
Eosinophils Relative: 1 %
HCT: 33.4 % — ABNORMAL LOW (ref 39.0–52.0)
Hemoglobin: 10.2 g/dL — ABNORMAL LOW (ref 13.0–17.0)
Immature Granulocytes: 0 %
Lymphocytes Relative: 12 %
Lymphs Abs: 1.2 10*3/uL (ref 0.7–4.0)
MCH: 25.2 pg — ABNORMAL LOW (ref 26.0–34.0)
MCHC: 30.5 g/dL (ref 30.0–36.0)
MCV: 82.7 fL (ref 80.0–100.0)
Monocytes Absolute: 0.8 10*3/uL (ref 0.1–1.0)
Monocytes Relative: 8 %
Neutro Abs: 7.8 10*3/uL — ABNORMAL HIGH (ref 1.7–7.7)
Neutrophils Relative %: 79 %
Platelets: 534 10*3/uL — ABNORMAL HIGH (ref 150–400)
RBC: 4.04 MIL/uL — ABNORMAL LOW (ref 4.22–5.81)
RDW: 19.3 % — ABNORMAL HIGH (ref 11.5–15.5)
WBC: 9.9 10*3/uL (ref 4.0–10.5)
nRBC: 0 % (ref 0.0–0.2)

## 2021-10-16 LAB — CULTURE, BLOOD (ROUTINE X 2)
Culture: NO GROWTH
Culture: NO GROWTH
Special Requests: ADEQUATE
Special Requests: ADEQUATE

## 2021-10-16 SURGERY — COLONOSCOPY WITH PROPOFOL
Anesthesia: Monitor Anesthesia Care

## 2021-10-16 MED ORDER — LACTATED RINGERS IV SOLN
INTRAVENOUS | Status: AC | PRN
  Administered 2021-10-16: 1000 mL via INTRAVENOUS

## 2021-10-16 MED ORDER — PROPOFOL 500 MG/50ML IV EMUL
INTRAVENOUS | Status: DC | PRN
  Administered 2021-10-16: 125 ug/kg/min via INTRAVENOUS

## 2021-10-16 SURGICAL SUPPLY — 22 items

## 2021-10-16 NOTE — Progress Notes (Signed)
I discussed the patient's colonoscopy findings with he and his sister and answered all of their questions and we discussed chemotherapy his port etc. and please call me if I can be of any further assistance  this holiday weekend

## 2021-10-16 NOTE — Op Note (Signed)
[facility] Patient Name: Jason Mcguire Procedure Date: 10/16/2021 MRN: [id number] Attending MD: Clarene Essex , MD Date of Birth: [birth year] CSN: [id number] Age: 65 Admit Type: Inpatient Procedure:                Colonoscopy Indications:              This is the patient's first colonoscopy,                            Hematochezia, Iron deficiency anemia secondary to                            chronic blood loss, Metastatic malignancy to liver Providers:                Clarene Essex, MD, Jerene Pitch Person, Benetta Spar,                            Technician Referring MD:              Medicines:                Propofol total dose 989 mg IV Complications:            No immediate complications. Estimated Blood Loss:     Estimated blood loss was minimal. Procedure:                Pre-Anesthesia Assessment:                           - Prior to the procedure, a History and Physical                            was performed, and patient medications and                            allergies were reviewed. The patient's tolerance of                            previous anesthesia was also reviewed. The risks                            and benefits of the procedure and the sedation                            options and risks were discussed with the patient.                            All questions were answered, and informed consent                            was obtained. Prior Anticoagulants: The patient has                            taken no previous anticoagulant or antiplatelet  agents. ASA Grade Assessment: II - A patient with                            mild systemic disease. After reviewing the risks                            and benefits, the patient was deemed in                            satisfactory condition to undergo the procedure.                           After obtaining informed consent, the colonoscope                            was  passed under direct vision. Throughout the                            procedure, the patient's blood pressure, pulse, and                            oxygen saturations were monitored continuously. The                            PCF-HQ190L ([id number]) Olympus colonoscope was                            introduced through the anus and advanced to the the                            cecum, identified by appendiceal orifice and                            ileocecal valve. The ileocecal valve, appendiceal                            orifice, and rectum were photographed. The                            colonoscopy was performed without difficulty. The                            patient tolerated the procedure well. The quality                            of the bowel preparation was adequate. Scope In: 1:38:18 PM Scope Out: 1:52:31 PM Scope Withdrawal Time: 0 hours 9 minutes 44 seconds  Total Procedure Duration: 0 hours 14 minutes 13 seconds  Findings:      External hemorrhoids were found during retroflexion, during perianal       exam and during digital exam. The hemorrhoids were small.      An infiltrative and ulcerated non-obstructing medium-sized mass was       found at the anus, in the rectum and in the distal rectum. The  mass was       partially circumferential (involving one-third of the lumen       circumference). No bleeding was present. Biopsies were taken with a cold       forceps for histology.      A few small-mouthed diverticula were found in the sigmoid colon and       descending colon.      A medium polyp was found in the splenic flexure. The polyp was       semi-sessile. Elected not to remove any of the polyps based on widely       metastatic disease      A small polyp was found in the ascending colon. The polyp was       semi-pedunculated.      A medium polyp was found in the ascending colon. The polyp was       semi-pedunculated.      The exam was otherwise without  abnormality. Impression:               - External hemorrhoids.                           - Malignant tumor at the anus, in the rectum and in                            the distal rectum. Biopsied.                           - Diverticulosis in the sigmoid colon and in the                            descending colon.                           - One medium polyp at the splenic flexure.                           - One small polyp in the ascending colon.                           - One medium polyp in the ascending colon.                           - The examination was otherwise normal. Moderate Sedation:      Not Applicable - Patient had care per Anesthesia. Recommendation:           - Soft diet today.                           - Continue present medications.                           - Await pathology results.                           - Repeat colonoscopy PRN for surveillance based on  pathology results.                           - Return to GI office PRN. Expect to see some                            bright red blood for a day or 2 from the biopsies                           - Telephone GI clinic for pathology results in 1                            week.                           - Telephone GI clinic if symptomatic PRN. Please                            let us know if we can be of any further assistance                            with this hospital stay otherwise further work-up                            and plans per oncology team Procedure Code(s):        --- Professional ---                           [phone], Colonoscopy, flexible; with biopsy, single                            or multiple Diagnosis Code(s):        --- Professional ---                           C21.0, Malignant neoplasm of anus, unspecified                           C20, Malignant neoplasm of rectum                           K63.5, Polyp of colon                           K92.1, Melena  (includes Hematochezia)                           D50.0, Iron deficiency anemia secondary to blood                            loss (chronic)                           C78.7, Secondary malignant neoplasm of liver and  intrahepatic bile duct                           K57.30, Diverticulosis of large intestine without                            perforation or abscess without bleeding CPT copyright 2019 [employer] Association. All rights reserved. The codes documented in this report are preliminary and upon coder review may  be revised to meet current compliance requirements. Clarene Essex, MD 10/16/2021 2:05:18 PM This report has been signed electronically. Number of Addenda: 0

## 2021-10-16 NOTE — Anesthesia Postprocedure Evaluation (Signed)
Anesthesia Post Note  Patient: Jason Mcguire  Procedure(s) Performed: COLONOSCOPY WITH PROPOFOL BIOPSY     Patient location during evaluation: PACU Anesthesia Type: MAC Level of consciousness: awake and alert Pain management: pain level controlled Vital Signs Assessment: post-procedure vital signs reviewed and stable Respiratory status: spontaneous breathing, nonlabored ventilation, respiratory function stable and patient connected to nasal cannula oxygen Cardiovascular status: stable and blood pressure returned to baseline Postop Assessment: no apparent nausea or vomiting Anesthetic complications: no   No notable events documented.  Last Vitals:  Vitals:   10/16/21 1400 10/16/21 1410  BP: 100/66 100/65  Pulse: 70 72  Resp: 15 14  Temp: 36.6 C   SpO2: 100% 100%    Last Pain:  Vitals:   10/16/21 1410  TempSrc:   PainSc: Asleep                 Catalina Gravel

## 2021-10-16 NOTE — Anesthesia Preprocedure Evaluation (Addendum)
Anesthesia Evaluation  Patient identified by MRN, date of birth, ID band Patient awake    Reviewed: Allergy & Precautions, NPO status , Patient's Chart, lab work & pertinent test results  Airway Mallampati: II  TM Distance: >3 FB Neck ROM: Full    Dental  (+) Teeth Intact, Dental Advisory Given, Chipped,    Pulmonary asthma , COPD, Current Smoker,    Pulmonary exam normal breath sounds clear to auscultation       Cardiovascular negative cardio ROS Normal cardiovascular exam Rhythm:Regular Rate:Normal     Neuro/Psych PSYCHIATRIC DISORDERS Anxiety negative neurological ROS     GI/Hepatic Metastatic liver cancer Colorectal cancer    Endo/Other  negative endocrine ROS  Renal/GU negative Renal ROS     Musculoskeletal negative musculoskeletal ROS (+)   Abdominal   Peds  (+) ADHD Hematology  (+) Blood dyscrasia, anemia ,   Anesthesia Other Findings Day of surgery medications reviewed with the patient.  Reproductive/Obstetrics                            Anesthesia Physical Anesthesia Plan  ASA: 4  Anesthesia Plan: MAC   Post-op Pain Management:    Induction: Intravenous  PONV Risk Score and Plan: 1 and Propofol infusion and Treatment may vary due to age or medical condition  Airway Management Planned: Nasal Cannula and Natural Airway  Additional Equipment:   Intra-op Plan:   Post-operative Plan:   Informed Consent: I have reviewed the patients History and Physical, chart, labs and discussed the procedure including the risks, benefits and alternatives for the proposed anesthesia with the patient or authorized representative who has indicated his/her understanding and acceptance.     Dental advisory given  Plan Discussed with: CRNA and Anesthesiologist  Anesthesia Plan Comments:         Anesthesia Quick Evaluation

## 2021-10-16 NOTE — Progress Notes (Signed)
Jason Mcguire 1:33 PM  Subjective: Patient seen and examined and case discussed with my partner Dr. Therisa Doyne in his hospital computer chart reviewed and other than abdominal pain at home he really did not have much GI symptoms although he has had some bright red blood per rectum and has never had a colonoscopy before  Objective: Vital signs stable afebrile no acute distress exam please see preassessment evaluation labs stable CT reviewed MRI reviewed CEA level noted  Assessment: Probable metastatic colon cancer  Plan: Okay to proceed with colonoscopy with anesthesia assistance and the procedure was rediscussed with the patient  [facility]  office [phone] After 5PM or if no answer call [phone]

## 2021-10-16 NOTE — Progress Notes (Signed)
Daily Progress Note   Patient Name: CORBITT CLOKE       Date: 10/16/2021 DOB: 1956-07-10  Age: 65 y.o. MRN#: [id number] Attending Physician: Antonieta Pert, MD Primary Care Physician: Kathyrn Lass, MD Admit Date: 10/07/2021  Reason for Consultation/Follow-up: Establishing goals of care and Pain control  Subjective: I saw and examined Mr. Kamel today.    We discussed conversation with Dr. Burr Medico and Dr. Therisa Doyne and reviewed plan for colonoscopy/sigmoidoscopy with biopsy of any findings.  Reviewed plan for port placement and initiation of chemotherapy.  We discussed again regarding pain management.  He reports that overall he feels the current regimen of OxyContin with breakthrough oxycodone has been working.  He has not required any IV medication since yesterday, however, he does report that he is hurting little bit now and wants to get his pain under better control before he goes down for procedure around noon.  Discussed that I would asked nurse to bring in rescue dose of Dilaudid at this time.  We also discussed bowel movements (he has done colon prep) and talked about plan to hold senna for the next 24 hours.  He does understand that we will need to continue with bowel regimen following this.  Length of Stay: 6  Current Medications: Scheduled Meds:   amphetamine-dextroamphetamine  20 mg Oral BID WC   feeding supplement  237 mL Oral BID BM   oxyCODONE  40 mg Oral Q12H   sodium chloride flush  3 mL Intravenous Q12H   thiamine  100 mg Oral Daily    Continuous Infusions:  sodium chloride 250 mL (10/08/21 1112)    PRN Meds: sodium chloride, acetaminophen **OR** acetaminophen, albuterol, ALPRAZolam, alum & mag hydroxide-simeth, hydrALAZINE, HYDROmorphone (DILAUDID) injection,  oxyCODONE, sodium chloride flush  Physical Exam         General: Alert, awake, in no acute distress.   HEENT: No bruits, no goiter, no JVD Heart: Regular rate and rhythm. No murmur appreciated. Lungs: Good air movement, clear Abdomen: Soft, globally tender, distended, positive bowel sounds.   Ext: No significant edema Skin: Warm and dry Neuro: Grossly intact, nonfocal.   Vital Signs: BP 112/69 (BP Location: Right Arm)    Pulse 72    Temp 97.7 F (36.5 C)    Resp 13    Ht 5\' 6"  (1.676  m)    Wt 53.6 kg    SpO2 100%    BMI 19.07 kg/m  SpO2: SpO2: 100 % O2 Device: O2 Device: Nasal Cannula O2 Flow Rate: O2 Flow Rate (L/min): 2 L/min  Intake/output summary:  Intake/Output Summary (Last 24 hours) at 10/16/2021 1445 Last data filed at 10/16/2021 1355 Gross per 24 hour  Intake 200 ml  Output 300 ml  Net -100 ml    LBM: Last BM Date: 10/15/21 Baseline Weight: Weight: 57.2 kg Most recent weight: Weight: 53.6 kg       Palliative Assessment/Data:    Flowsheet Rows    Flowsheet Row Most Recent Value  Intake Tab   Referral Department Hospitalist  Unit at Time of Referral Oncology Unit  Palliative Care Primary Diagnosis Cancer  Date Notified 10/12/21  Palliative Care Type New Palliative care  Reason for referral Clarify Goals of Care, Pain  Date of Admission 10/07/21  Date first seen by Palliative Care 10/13/21  # of days Palliative referral response time 1 Day(s)  # of days IP prior to Palliative referral 5  Clinical Assessment   Palliative Performance Scale Score 40%  Psychosocial & Spiritual Assessment   Palliative Care Outcomes   Patient/Family meeting held? Yes  Who was at the meeting? patient, sister       Patient Active Problem List   Diagnosis Date Noted   Protein-calorie malnutrition, severe 10/15/2021   Malignant neoplasm metastatic to liver with unknown primary site [facility]) 10/10/2021   Attention deficit disorder with hyperactivity    Anxiety disorder     Ascites, malignant    Weight loss    Anemia    Dehydration    Elevated CK    Elevated lactic acid level    Elevated troponin    Rectal cancer metastasized to liver ([place]) 10/07/2021   COPD (chronic obstructive pulmonary disease) ([place]) 05/06/2011   Smoker 05/06/2011   Chronic rhinitis 05/06/2011    Palliative Care Assessment & Plan   Patient Profile: Mr. Eschmann is a 65 year old male with past medical history of asthma, ADHD and anxiety who presented with shortness of breath and worsened abdominal distention for about 3 weeks.  He was found out elevated LFTs, ascites with CT abdomen pelvis and liver lesions concerning for metastatic cancer.  There are also lesions noted in lungs on imaging.  He underwent paracentesis as well as liver biopsy.  Recommendations/Plan: Pain, cancer related: He reports that overall his pain has been well controlled on current regimen.  Continue same for now.  This includes:   Oxycontin to 40mg  twice daily.   Oxycodone 10mg  every 3 hours as first line pain medication.    Dilaudid 1mg  IV as second line pain medication to be given 45-60 minutes after oral medication if oral medication is insufficient to relieve pain. Await further planning from oncology.  He is currently invested in plan to pursue any recommended modifying therapy.  Goals of Care and Additional Recommendations: Limitations on Scope of Treatment: Full Scope Treatment  Code Status:    Code Status Orders  (From admission, onward)           Start     Ordered   10/08/21 0031  Full code  Continuous        10/08/21 0031           Code Status History     This patient has a current code status but no historical code status.       Prognosis:  Unable  to determine  Discharge Planning: To Be Determined  Care plan was discussed with patient Thank you for allowing the Palliative Medicine Team to assist in the care of this patient.   Total Time 30 Prolonged Time Billed No    Greater than 50%  of this time was spent counseling and coordinating care related to the above assessment and plan.  Micheline Rough, MD  Please contact Palliative Medicine Team phone at [phone] for questions and concerns.

## 2021-10-16 NOTE — Progress Notes (Signed)
PROGRESS NOTE    Jason Mcguire  MGQ:[id number] DOB: Jun 24, 1956 DOA: 10/07/2021 PCP: Kathyrn Lass, MD   Chief Complaint  Patient presents with   Abdominal Pain   Shortness of Breath  Brief Narrative/Hospital Course: Jason Mcguire, 65 y.o. male with PMH of asthma, ADHD, presented with SOB, worsening abdominal distension ongoing for about 3 weeks PTA.Hx of heavy alcohol abuse when he was younger, currently drinks socially. In the ED, noted to have elevated LFTs, ascites with CT abd/pelvis showing liver lesions concerning for metastatic cancer.   Patient was admitted underwent further work-up.  He had paracentesis on 12/23 that showed no signs of infection.Underwent image guided liver biopsy and repeat paracentesis 12/27 Patient continues to have abdominal distention pain issues and being managed with pain management.  Palliative care following. 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  He complains of having loose stool from bowel prep. 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 Suspecting lower rectal mass likely the primary on exam.Overall prognosis is poor in the setting of diffuse metastasis and mild ascites and low performance status, it is incurable disease but treatable as per oncology patient interested to pursue treatment planning for her GI  with flexible sigmoidoscopy versus colonoscopy 12/1 for tissue diagnosis and IR port placement likely Wednesday.  Pain management Cancer related pain: Continue to adjust the pain regimen with OxyContin 40 bid and prn oc /dilaudid iv per PMT. Pain is controlled fairly now.  Watch for overdose hold for sedation  Centrilobular emphysema noted in the CT chest.  Leukocytosis has resolved.  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  Goals of care: Chaplain consulted sister at the bedside, Perative care is on board.  Appreciate input from pain management.  Overall prognosis is poor patient and family understands they like to pursue further treatment of this incurable disease but treatable as per oncology.  Severe malnutrition Nutrition Problem: Severe Malnutrition Etiology: chronic illness Signs/Symptoms: severe fat depletion, severe muscle depletion, energy intake < or equal to 75% for > or equal to 1 month Interventions: Ensure Enlive (each supplement provides 350kcal and 20 grams of protein)   Significant  weight loss 61.2 in Sept to  > 53.6  kg-suspected due to malignancy, severe protein malnutrition.  Supplement diet RD eval    DVT prophylaxis: SCDs  Start: 10/08/21 0031.  Lovenox held 2/2 biopsy and rectal bleed Code Status:   Code Status: Full Code Family Communication: plan of care discussed with patient and his ssister  at bedside previously, no family at the bedside this morning.  Status is: Inpatient Remains inpatient appropriate because: For ongoing management of abdominal pain ascites and biopsy result Disposition: Currently not medically stable for discharge. Anticipated Disposition: TBD   Objective: Vitals last 24 hrs: Vitals:   10/14/21 2024 10/15/21 0428 10/15/21 1900 10/16/21 0549  BP: (!) 167/83 (!) 153/89 (!) 144/83 (!) 156/96  Pulse: 87 85 88 63  Resp: 18 16 20 17   Temp: 98 F (36.7 C) 98.1 F (36.7 C) (!) 97.5 F (36.4 C) 97.7 F (36.5 C)  TempSrc: Oral Oral Oral Oral  SpO2: 97% 95% 100% 97%  Weight:      Height:       Weight change:   Intake/Output Summary (Last 24 hours) at 10/16/2021 1105 Last data filed at 10/15/2021 1900 Gross per 24 hour  Intake 240 ml  Output 300 ml  Net -60 ml    Net IO Since Admission: -798.09 mL [10/16/21 1105]   Physical Examination: General exam: AAOx 3,thin, older than stated age, weak appearing. HEENT:Oral mucosa moist, Ear/Nose WNL grossly, dentition normal. Respiratory system: bilaterally diminished, no use of accessory muscle Cardiovascular system: S1 & S2 +, No JVD,. Gastrointestinal system: Abdomen firm, moderately distended, diffuse tenderness, BS+ Nervous System:Alert, awake, moving extremities and grossly nonfocal Extremities: no edema, distal peripheral pulses palpable.  Skin: No rashes,no icterus. MSK: Normal muscle bulk,tone, power   Medications reviewed:  Scheduled Meds:  amphetamine-dextroamphetamine  20 mg Oral BID WC   feeding supplement  237 mL Oral BID BM   oxyCODONE  40 mg Oral Q12H   senna-docusate  2 tablet Oral BID   sodium chloride flush  3 mL Intravenous Q12H   thiamine  100 mg Oral Daily   Continuous Infusions:  sodium chloride 250  mL (10/08/21 1112)   sodium chloride 20 mL/hr at 10/16/21 5329    Diet Order             Diet NPO time specified  Diet effective midnight                   Nutrition Problem: Severe Malnutrition Etiology: chronic illness Signs/Symptoms: severe fat depletion, severe muscle depletion, energy intake < or equal to 75% for > or equal to 1 month Interventions: Ensure Enlive (each supplement provides 350kcal and 20 grams of protein)  Weight change:   Wt Readings from Last 3 Encounters:  10/07/21 53.6 kg  01/25/19 59 kg  06/20/13 61.2 kg  Consultants:see note  Procedures:see note Antimicrobials: Anti-infectives (From admission, onward)    None      Culture/Microbiology    Component Value Date/Time   SDES  10/12/2021 1511    PERITONEAL Performed at [facility], [place] [address], [place], [place] [address]    SPECREQUEST  10/12/2021 1511    NONE Performed at [facility], [place] [address] [place], [place] [address]    CULT  10/12/2021 1511    NO GROWTH 3 DAYS Performed at [place] [address] [place], [place] [address]    REPTSTATUS 10/15/2021 FINAL 10/12/2021 1511    Other  culture-see note  Unresulted Labs (From admission, onward)     Start     Ordered   10/13/21 1455  Occult blood card to lab, stool  ONCE - STAT,   STAT        10/13/21 1458   10/10/21 0500  CBC with Differential/Platelet  Daily,   R     Question:  Specimen collection method  Answer:  Lab=Lab collect   10/09/21 1744   Unscheduled  Occult blood card to lab, stool  As needed,   R      10/08/21 0249          Data Reviewed: I have personally reviewed following labs and imaging studies CBC: Recent Labs  Lab 10/12/21 0520 10/13/21 0537 10/13/21 2000 10/14/21 0754 10/15/21 0552 10/16/21 0648  WBC 14.6* 12.7*  --  15.0* 11.3* 9.9  NEUTROABS 12.5* 11.1*  --  13.1* 9.2* 7.8*  HGB 11.0* 10.5* 10.6* 12.6* 10.5* 10.2*  HCT 35.6* 33.7* 33.9*  42.7 34.4* 33.4*  MCV 81.7 82.4  --  86.8 83.3 82.7  PLT 543* 333  --  444* 520* 534*    Basic Metabolic Panel: Recent Labs  Lab 10/10/21 0639 10/11/21 0536 10/12/21 0520 10/13/21 0537 10/14/21 0754  NA 135 135 135 130* 134*  K 3.9 3.9 4.5 4.6 4.3  CL 98 98 98 97* 95*  CO2 28 26 27 25 27   GLUCOSE 92 90 89 115* 90  BUN 13 11 14 16 16   CREATININE 0.60* 0.57* 0.54* 0.69 0.58*  CALCIUM 8.2* 8.4* 8.6* 8.2* 8.7*    GFR: Estimated Creatinine Clearance: 69.8 mL/min (A) (by C-G formula based on SCr of 0.58 mg/dL (L)). Liver Function Tests: Recent Labs  Lab 10/10/21 [phone] 10/11/21 0536 10/12/21 0520 10/13/21 0537 10/14/21 0754  AST 76* 89* 329* 323* 125*  ALT 24 27 61* 65* 46*  ALKPHOS 431* 406* 414* 457* 457*  BILITOT 0.7 0.8 1.1 1.0 1.1  PROT 6.3* 6.5 6.8 6.3* 6.8  ALBUMIN 2.4* 2.4* 2.5* 2.3* 2.3*    No results for input(s): LIPASE, AMYLASE in the last 168 hours.  No results for input(s): AMMONIA in the last 168 hours.  Coagulation Profile: Recent Labs  Lab 10/12/21 0520  INR 1.2    Cardiac Enzymes: No results for input(s): CKTOTAL, CKMB, CKMBINDEX, TROPONINI in the last 168 hours.  BNP (last 3 results) No results for input(s): PROBNP in the last 8760 hours. HbA1C: No results for input(s): HGBA1C in the last 72 hours. CBG: No results for input(s): GLUCAP in the last 168 hours. Lipid Profile: No results for input(s): CHOL, HDL, LDLCALC, TRIG, CHOLHDL, LDLDIRECT in the last 72 hours. Thyroid Function Tests: No results for input(s): TSH, T4TOTAL, FREET4, T3FREE, THYROIDAB in the last 72 hours. Anemia Panel: No results for input(s): VITAMINB12, FOLATE, FERRITIN, TIBC, IRON, RETICCTPCT in the last 72 hours. Sepsis Labs: Recent Labs  Lab 10/11/21 0813 10/11/21 0906 10/12/21 0520 10/13/21 0537  PROCALCITON 2.36  --  2.52 6.96  LATICACIDVEN 1.9 2.0*  --   --      Recent Results (from the past 240 hour(s))  Resp Panel by RT-PCR (Flu A&B, Covid)  Nasopharyngeal Swab     Status: None   Collection Time: 10/07/21  6:19 PM   Specimen: Nasopharyngeal Swab; Nasopharyngeal(NP) swabs in vial transport medium  Result Value Ref Range Status   SARS Coronavirus 2 by RT PCR NEGATIVE NEGATIVE Final    Comment: (NOTE) SARS-CoV-2 target nucleic acids are NOT DETECTED.  The SARS-CoV-2 RNA is generally detectable in upper respiratory specimens during the acute phase of infection. The lowest concentration of SARS-CoV-2 viral copies this assay can detect is 138 copies/mL. A negative result does not preclude SARS-Cov-2 infection and should not be used as the sole basis for treatment or other patient management decisions. A negative result may occur with  improper specimen collection/handling, submission of specimen other than nasopharyngeal swab, presence of viral mutation(s) within the areas targeted by this assay, and inadequate number of viral copies(<138 copies/mL). A negative result must be combined with clinical observations, patient history, and epidemiological information. The expected result is Negative.  Fact Sheet for Patients:  EntrepreneurPulse.com.au  Fact Sheet for Healthcare Providers:  IncredibleEmployment.be  This test is no t yet approved or cleared by the [place] FDA and  has been authorized for detection and/or diagnosis of SARS-CoV-2 by FDA under an Emergency Use Authorization (EUA). This EUA will remain  in effect (meaning this test can be used) for the duration of the COVID-19 declaration under Section 564(b)(1) of the Act, 21 U.S.C.section 360bbb-3(b)(1), unless the authorization is terminated  or revoked sooner.       Influenza A by PCR NEGATIVE NEGATIVE Final   Influenza B by PCR NEGATIVE NEGATIVE Final    Comment: (NOTE) The Xpert Xpress SARS-CoV-2/FLU/RSV plus assay is intended as an aid in the diagnosis of influenza from Nasopharyngeal swab specimens and should not be  used as a sole basis for treatment. Nasal washings and aspirates are unacceptable for Xpert Xpress SARS-CoV-2/FLU/RSV testing.  Fact Sheet for Patients: EntrepreneurPulse.com.au  Fact Sheet for Healthcare Providers: IncredibleEmployment.be  This test is not yet approved or cleared by the [place] FDA and has been authorized for detection and/or diagnosis of SARS-CoV-2 by FDA under an Emergency Use Authorization (EUA). This EUA will remain in effect (meaning this test can be used) for the duration of the COVID-19 declaration under Section 564(b)(1) of the Act, 21 U.S.C. section 360bbb-3(b)(1), unless the authorization is terminated or revoked.  Performed at [facility], [address] [place], [place] [address]   Urine Culture     Status: None   Collection Time: 10/07/21  8:10 PM   Specimen: Urine, Clean Catch  Result Value Ref Range Status   Specimen Description   Final    URINE, CLEAN CATCH Performed at [facility], [place] [place], [place] [address]    Special Requests   Final    NONE Performed at [facility], [place] [place], [place] [address]    Culture   Final    NO GROWTH Performed at [facility], [place] [address], [place], [place] [address]    Report Status 10/09/2021 FINAL  Final  Body fluid culture w Gram Stain     Status: None   Collection Time: 10/08/21 10:38 AM   Specimen: PATH Cytology Peritoneal fluid  Result Value Ref Range Status   Specimen Description   Final    PERITONEAL Performed at [place] [address] [place], [place] [address]    Special Requests   Final    NONE Performed at [facility], [place] [address], [place], [place] [address]    Gram Stain NO WBC SEEN NO ORGANISMS SEEN   Final   Culture   Final    NO GROWTH 3 DAYS Performed at [facility], [place] [address] [place], [place] [address]  Report Status 10/11/2021 FINAL  Final  Culture, blood (routine x 2)     Status: None   Collection Time: 10/11/21  8:12 AM   Specimen: BLOOD  Result Value Ref Range Status   Specimen Description   Final    BLOOD BLOOD RIGHT HAND Performed at [place] [address], [place], [place] [address]    Special Requests   Final    BOTTLES DRAWN AEROBIC ONLY Blood Culture adequate volume Performed at [place] [address] [place], [place] [address]    Culture   Final    NO GROWTH 5 DAYS Performed at [facility], [place] [address] [place], [place] [address]    Report Status 10/16/2021 FINAL  Final  Culture, blood (routine x 2)     Status: None   Collection Time: 10/11/21  8:15 AM   Specimen: BLOOD  Result Value Ref Range Status   Specimen Description   Final    BLOOD RIGHT ANTECUBITAL Performed at [place] [address] [place], [place] [address]    Special Requests   Final    BOTTLES DRAWN AEROBIC ONLY Blood Culture adequate volume Performed at [place] [address], [place], [place] [address]    Culture   Final    NO GROWTH 5 DAYS Performed at [facility], [place] [address], [place], [place] [address]    Report Status 10/16/2021 FINAL  Final  Urine Culture     Status: Abnormal   Collection Time: 10/11/21 11:10 AM   Specimen: Urine, Clean Catch  Result Value Ref Range Status   Specimen Description   Final    URINE, CLEAN CATCH Performed at [facility], [place] [address], [place], [place] [address]    Special Requests   Final    NONE Performed at [facility], [place] [address], [place], [place] [address]    Culture (A)  Final    <10,000 COLONIES/mL INSIGNIFICANT GROWTH Performed at [place] [address], [place], [place] [address]    Report Status 10/12/2021 FINAL  Final  Fungus Culture With Stain     Status: None  (Preliminary result)   Collection Time: 10/12/21  3:11 PM   Specimen: PATH Cytology Peritoneal fluid  Result Value Ref Range Status   Fungus Stain Final report  Final    Comment: (NOTE) Performed At: [facility] [address] [place], [place] [id number] Rush Farmer MD [id number]    Fungus (Mycology) Culture PENDING  Incomplete   Fungal Source PERITONEAL  Final    Comment: Performed at [facility], [place] [address], [place], [place] [address]  Body fluid culture w Gram Stain     Status: None   Collection Time: 10/12/21  3:11 PM   Specimen: PATH Cytology Peritoneal fluid  Result Value Ref Range Status   Specimen Description   Final    PERITONEAL Performed at [place] [address] [place], [place] [address]    Special Requests   Final    NONE Performed at [facility], [place] [address] [place], [place] [address]    Gram Stain   Final    FEW WBC PRESENT, PREDOMINANTLY MONONUCLEAR NO ORGANISMS SEEN    Culture   Final    NO GROWTH 3 DAYS Performed at [place] [address], [place],  [address]    Report Status  10/15/2021 FINAL  Final  Fungus Culture Result     Status: None   Collection Time: 10/12/21  3:11 PM  Result Value Ref Range Status   Result 1 Comment  Final    Comment: (NOTE) KOH/Calcofluor preparation:  no fungus observed. Performed At: [facility] [place], [place] [id number] Rush Farmer MD RA:[id number]       Radiology Studies: No results found.   LOS: 6 days   Antonieta Pert, MD [facility]  10/16/2021, 11:05 AM

## 2021-10-16 NOTE — Transfer of Care (Signed)
Immediate Anesthesia Transfer of Care Note  Patient: Jason Mcguire  Procedure(s) Performed: COLONOSCOPY WITH PROPOFOL  Patient Location: PACU  Anesthesia Type:MAC  Level of Consciousness: sedated, patient cooperative and responds to stimulation  Airway & Oxygen Therapy: Patient Spontanous Breathing and Patient connected to face mask oxygen  Post-op Assessment: Report given to RN and Post -op Vital signs reviewed and stable  Post vital signs: Reviewed and stable  Last Vitals:  Vitals Value Taken Time  BP 100/66 10/16/21 1402  Temp    Pulse 71 10/16/21 1403  Resp 16 10/16/21 1403  SpO2 100 % 10/16/21 1403  Vitals shown include unvalidated device data.  Last Pain:  Vitals:   10/16/21 1255  TempSrc: Oral  PainSc: 7       Patients Stated Pain Goal: 3 (16/83/72 9021)  Complications: No notable events documented.

## 2021-10-17 DIAGNOSIS — C20 Malignant neoplasm of rectum: Secondary | ICD-10-CM

## 2021-10-17 DIAGNOSIS — Z515 Encounter for palliative care: Secondary | ICD-10-CM

## 2021-10-17 DIAGNOSIS — C787 Secondary malignant neoplasm of liver and intrahepatic bile duct: Secondary | ICD-10-CM

## 2021-10-17 DIAGNOSIS — G893 Neoplasm related pain (acute) (chronic): Secondary | ICD-10-CM

## 2021-10-17 LAB — CBC WITH DIFFERENTIAL/PLATELET
Abs Immature Granulocytes: 0.06 10*3/uL (ref 0.00–0.07)
Basophils Absolute: 0.1 10*3/uL (ref 0.0–0.1)
Basophils Relative: 1 %
Eosinophils Absolute: 0.1 10*3/uL (ref 0.0–0.5)
Eosinophils Relative: 1 %
HCT: 32.3 % — ABNORMAL LOW (ref 39.0–52.0)
Hemoglobin: 9.6 g/dL — ABNORMAL LOW (ref 13.0–17.0)
Immature Granulocytes: 1 %
Lymphocytes Relative: 9 %
Lymphs Abs: 1.2 10*3/uL (ref 0.7–4.0)
MCH: 25.1 pg — ABNORMAL LOW (ref 26.0–34.0)
MCHC: 29.7 g/dL — ABNORMAL LOW (ref 30.0–36.0)
MCV: 84.3 fL (ref 80.0–100.0)
Monocytes Absolute: 1.1 10*3/uL — ABNORMAL HIGH (ref 0.1–1.0)
Monocytes Relative: 8 %
Neutro Abs: 10.3 10*3/uL — ABNORMAL HIGH (ref 1.7–7.7)
Neutrophils Relative %: 80 %
Platelets: 508 10*3/uL — ABNORMAL HIGH (ref 150–400)
RBC: 3.83 MIL/uL — ABNORMAL LOW (ref 4.22–5.81)
RDW: 19.4 % — ABNORMAL HIGH (ref 11.5–15.5)
WBC: 12.8 10*3/uL — ABNORMAL HIGH (ref 4.0–10.5)
nRBC: 0 % (ref 0.0–0.2)

## 2021-10-17 NOTE — Progress Notes (Signed)
PROGRESS NOTE    TAMEL ABEL  LOV:[id number] DOB: Feb 19, 1956 DOA: 10/07/2021 PCP: Kathyrn Lass, MD   Chief Complaint  Patient presents with   Abdominal Pain   Shortness of Breath  Brief Narrative/Hospital Course: BENJIE RICKETSON, 66 y.o. male with PMH of asthma, ADHD, presented with SOB, worsening abdominal distension ongoing for about 3 weeks PTA.Hx of heavy alcohol abuse when he was younger, currently drinks socially. In the ED, noted to have elevated LFTs, ascites with CT abd/pelvis showing liver lesions concerning for metastatic cancer.   Patient was admitted underwent further work-up.  He had paracentesis on 12/23 that showed no signs of infection.Underwent image guided liver biopsy and repeat paracentesis 12/27 Patient continues to have abdominal distention pain issues and being managed with pain management.  Palliative care following. 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  Assessment & Plan:  Newly diagnosed anal/rectal mass with metastatic adenocarcinoma to liver, peritoneum and possible lungs Malignant ascites: Presentation with  diffuse innumerable bilobar hepatic lesions, abdominal adenopathy and recurrent ascites and abnormal LFTs,multiple solid bilateral pulmonary nodules: S/p abdominal paracentesis 12/23-cytology with reactive cell and subsequent paracentesis again on 12/27 with liver biopsy and biopsy consistent with metastatic disease adenocarcinoma. Suspecting lower rectal mass likely the primary on exam.Overall prognosis is poor in the setting of diffuse metastasis and mild ascites and low performance status, it is incurable disease but treatable as per oncology  patient interested to pursue treatment -underwent colonoscopy with biopsy12/31.plan for IR port placement likely Wednesday.  Pain management Cancer related pain: Managed by palliative care, continue to adjust the pain regimen, currently on OxyContin 40 bid and prn oxy PO / Dilaudid iv.Watch for sedation.  Centrilobular emphysema noted in the CT chest.  Leukocytosis likely from metastatic disease.  No evidence of infection.   Recent Labs  Lab 10/11/21 0813 10/11/21 0906 10/12/21 0520 10/13/21 0537 10/14/21 0754 10/15/21 0552 10/16/21 0648 10/17/21 0614  WBC  --   --  14.6* 12.7* 15.0* 11.3* 9.9 12.8*  LATICACIDVEN 1.9 2.0*  --   --   --   --   --   --   PROCALCITON 2.36  --  2.52 6.96  --   --   --   --      Iron-deficiency anemia likely from malignancy monitor hb.  Iron supplementation upon discharge  Rectal bleeding episodes from rectal cancer.  Monitor hemoglobin Recent Labs  Lab 10/13/21 2000 10/14/21 0754 10/15/21 0552 10/16/21 0648 10/17/21 0614  HGB 10.6* 12.6* 10.5* 10.2* 9.6*  HCT 33.9* 42.7 34.4* 33.4* 32.3*     Elevated TSH with normal free T4 outpatient follow-up in 3 weeks  Hyponatremia -stable.  Anxiety disorder ADHD: Continue on Xanax  and home adderal.  Goals of care: Palliative care following closely, appreciate input from pain management.  Overall prognosis is poor patient and family understands they like to pursue further treatment of this incurable disease but treatable as per oncology.  Severe malnutrition Nutrition Problem: Severe Malnutrition Etiology: chronic illness Signs/Symptoms: severe fat depletion, severe muscle depletion, energy intake < or equal to 75% for > or equal to 1 month Interventions: Ensure Enlive (each supplement provides 350kcal and 20 grams of protein)   Significant weight loss 61.2 in Sept to  > 53.6  kg-suspected  due to malignancy, severe protein malnutrition.  Supplement diet RD eval    DVT prophylaxis: SCDs Start:  10/08/21 0031.  Lovenox held 2/2 biopsy and rectal bleed Code Status:   Code Status: Full Code Family Communication: plan of care discussed with patient and his ssister  at bedside previously, no family at the bedside this morning.  Status is: Inpatient Remains inpatient appropriate because: For ongoing management of abdominal pain ascites and biopsy result Disposition: Currently not medically stable for discharge. Anticipated Disposition: TBD-once cleared by oncology we will plan for discharge   Objective: Vitals last 24 hrs: Vitals:   10/16/21 1420 10/16/21 1429 10/16/21 1452 10/17/21 0417  BP: 124/78 112/69 139/82 (!) 146/67  Pulse: 72 72 68 85  Resp: 14 13 18 16   Temp: 97.7 F (36.5 C)  (!) 97.3 F (36.3 C) 98.2 F (36.8 C)  TempSrc:   Oral Oral  SpO2: 100% 100% 100% 100%  Weight:      Height:       Weight change:   Intake/Output Summary (Last 24 hours) at 10/17/2021 0831 Last data filed at 10/16/2021 2127 Gross per 24 hour  Intake 560 ml  Output 250 ml  Net 310 ml    Net IO Since Admission: -488.09 mL [10/17/21 0831]   Physical Examination: General exam: AAOx 3, thin cachectic, older than stated age, weak appearing. HEENT:Oral mucosa moist, Ear/Nose WNL grossly, dentition normal. Respiratory system: bilaterally clear, no use of accessory muscle Cardiovascular system: S1 & S2 +, No JVD,. Gastrointestinal system: Abdomen soft, moderately distended, generalized tenderness, BS+ Nervous System:Alert, awake, moving extremities and grossly nonfocal Extremities: mild ankle edema, distal peripheral pulses palpable.  Skin: No rashes,no icterus. MSK: Normal muscle bulk,tone, power   Medications reviewed:  Scheduled Meds:  amphetamine-dextroamphetamine  20 mg Oral BID WC   feeding supplement  237 mL Oral BID BM   oxyCODONE  40 mg Oral Q12H   sodium chloride flush  3 mL Intravenous Q12H   thiamine  100 mg Oral Daily   Continuous Infusions:  sodium chloride 250 mL  (10/08/21 1112)    Diet Order             DIET SOFT Room service appropriate? Yes; Fluid consistency: Thin  Diet effective now                   Nutrition Problem: Severe Malnutrition Etiology: chronic illness Signs/Symptoms: severe fat depletion, severe muscle depletion, energy intake < or equal to 75% for > or equal to 1 month Interventions: Ensure Enlive (each supplement provides 350kcal and 20 grams of protein)  Weight change:   Wt Readings from Last 3 Encounters:  10/16/21 53.6 kg  01/25/19 59 kg  06/20/13 61.2 kg  Consultants:see note  Procedures:see note Antimicrobials: Anti-infectives (From admission, onward)    None      Culture/Microbiology    Component Value Date/Time   SDES  10/12/2021 1511    PERITONEAL Performed at [facility], [place] [address] [place], [place] [address]    SPECREQUEST  10/12/2021 1511    NONE Performed at [facility], [place] [address] [place], [place] [address]    CULT  10/12/2021 1511    NO GROWTH 3 DAYS Performed at [facility] [address], [place], [place] [address]    REPTSTATUS 10/15/2021 FINAL 10/12/2021 1511    Other culture-see note  Unresulted Labs (From admission, onward)     Start     Ordered  10/13/21 1455  Occult blood card to lab, stool  ONCE - STAT,   STAT        10/13/21 1458   10/10/21 0500  CBC with Differential/Platelet  Daily,   R     Question:  Specimen collection method  Answer:  Lab=Lab collect   10/09/21 1744   Unscheduled  Occult blood card to lab, stool  As needed,   R      10/08/21 0249          Data Reviewed: I have personally reviewed following labs and imaging studies CBC: Recent Labs  Lab 10/13/21 0537 10/13/21 2000 10/14/21 0754 10/15/21 0552 10/16/21 0648 10/17/21 0614  WBC 12.7*  --  15.0* 11.3* 9.9 12.8*  NEUTROABS 11.1*  --  13.1* 9.2* 7.8* 10.3*  HGB 10.5* 10.6* 12.6* 10.5* 10.2* 9.6*  HCT 33.7* 33.9* 42.7 34.4* 33.4*  32.3*  MCV 82.4  --  86.8 83.3 82.7 84.3  PLT 333  --  444* 520* 534* 508*    Basic Metabolic Panel: Recent Labs  Lab 10/11/21 0536 10/12/21 0520 10/13/21 0537 10/14/21 0754  NA 135 135 130* 134*  K 3.9 4.5 4.6 4.3  CL 98 98 97* 95*  CO2 26 27 25 27   GLUCOSE 90 89 115* 90  BUN 11 14 16 16   CREATININE 0.57* 0.54* 0.69 0.58*  CALCIUM 8.4* 8.6* 8.2* 8.7*    GFR: Estimated Creatinine Clearance: 69.8 mL/min (A) (by C-G formula based on SCr of 0.58 mg/dL (L)). Liver Function Tests: Recent Labs  Lab 10/11/21 0536 10/12/21 0520 10/13/21 0537 10/14/21 0754  AST 89* 329* 323* 125*  ALT 27 61* 65* 46*  ALKPHOS 406* 414* 457* 457*  BILITOT 0.8 1.1 1.0 1.1  PROT 6.5 6.8 6.3* 6.8  ALBUMIN 2.4* 2.5* 2.3* 2.3*    No results for input(s): LIPASE, AMYLASE in the last 168 hours.  No results for input(s): AMMONIA in the last 168 hours.  Coagulation Profile: Recent Labs  Lab 10/12/21 0520  INR 1.2    Cardiac Enzymes: No results for input(s): CKTOTAL, CKMB, CKMBINDEX, TROPONINI in the last 168 hours.  BNP (last 3 results) No results for input(s): PROBNP in the last 8760 hours. HbA1C: No results for input(s): HGBA1C in the last 72 hours. CBG: No results for input(s): GLUCAP in the last 168 hours. Lipid Profile: No results for input(s): CHOL, HDL, LDLCALC, TRIG, CHOLHDL, LDLDIRECT in the last 72 hours. Thyroid Function Tests: No results for input(s): TSH, T4TOTAL, FREET4, T3FREE, THYROIDAB in the last 72 hours. Anemia Panel: No results for input(s): VITAMINB12, FOLATE, FERRITIN, TIBC, IRON, RETICCTPCT in the last 72 hours. Sepsis Labs: Recent Labs  Lab 10/11/21 0813 10/11/21 0906 10/12/21 0520 10/13/21 0537  PROCALCITON 2.36  --  2.52 6.96  LATICACIDVEN 1.9 2.0*  --   --      Recent Results (from the past 240 hour(s))  Resp Panel by RT-PCR (Flu A&B, Covid) Nasopharyngeal Swab     Status: None   Collection Time: 10/07/21  6:19 PM   Specimen: Nasopharyngeal Swab;  Nasopharyngeal(NP) swabs in vial transport medium  Result Value Ref Range Status   SARS Coronavirus 2 by RT PCR NEGATIVE NEGATIVE Final    Comment: (NOTE) SARS-CoV-2 target nucleic acids are NOT DETECTED.  The SARS-CoV-2 RNA is generally detectable in upper respiratory specimens during the acute phase of infection. The lowest concentration of SARS-CoV-2 viral copies this assay can detect is 138 copies/mL. A negative result does not preclude SARS-Cov-2 infection  and should not be used as the sole basis for treatment or other patient management decisions. A negative result may occur with  improper specimen collection/handling, submission of specimen other than nasopharyngeal swab, presence of viral mutation(s) within the areas targeted by this assay, and inadequate number of viral copies(<138 copies/mL). A negative result must be combined with clinical observations, patient history, and epidemiological information. The expected result is Negative.  Fact Sheet for Patients:  EntrepreneurPulse.com.au  Fact Sheet for Healthcare Providers:  IncredibleEmployment.be  This test is no t yet approved or cleared by the [place] FDA and  has been authorized for detection and/or diagnosis of SARS-CoV-2 by FDA under an Emergency Use Authorization (EUA). This EUA will remain  in effect (meaning this test can be used) for the duration of the COVID-19 declaration under Section 564(b)(1) of the Act, 21 U.S.C.section 360bbb-3(b)(1), unless the authorization is terminated  or revoked sooner.       Influenza A by PCR NEGATIVE NEGATIVE Final   Influenza B by PCR NEGATIVE NEGATIVE Final    Comment: (NOTE) The Xpert Xpress SARS-CoV-2/FLU/RSV plus assay is intended as an aid in the diagnosis of influenza from Nasopharyngeal swab specimens and should not be used as a sole basis for treatment. Nasal washings and aspirates are unacceptable for Xpert Xpress  SARS-CoV-2/FLU/RSV testing.  Fact Sheet for Patients: EntrepreneurPulse.com.au  Fact Sheet for Healthcare Providers: IncredibleEmployment.be  This test is not yet approved or cleared by the [place] FDA and has been authorized for detection and/or diagnosis of SARS-CoV-2 by FDA under an Emergency Use Authorization (EUA). This EUA will remain in effect (meaning this test can be used) for the duration of the COVID-19 declaration under Section 564(b)(1) of the Act, 21 U.S.C. section 360bbb-3(b)(1), unless the authorization is terminated or revoked.  Performed at [facility], [address], [place], [place] [address]   Urine Culture     Status: None   Collection Time: 10/07/21  8:10 PM   Specimen: Urine, Clean Catch  Result Value Ref Range Status   Specimen Description   Final    URINE, CLEAN CATCH Performed at [facility], [place] [place], [place] [address]    Special Requests   Final    NONE Performed at [facility], [place] [place], [place] [address]    Culture   Final    NO GROWTH Performed at [facility], [place] [address] [place], [place] [address]    Report Status 10/09/2021 FINAL  Final  Body fluid culture w Gram Stain     Status: None   Collection Time: 10/08/21 10:38 AM   Specimen: PATH Cytology Peritoneal fluid  Result Value Ref Range Status   Specimen Description   Final    PERITONEAL Performed at [place] [address] [place], [place] [address]    Special Requests   Final    NONE Performed at [facility], [place] [address], [place], [place] [address]    Gram Stain NO WBC SEEN NO ORGANISMS SEEN   Final   Culture   Final    NO GROWTH 3 DAYS Performed at [facility], [place] [address] [place], [place] [address]    Report Status 10/11/2021 FINAL  Final  Culture, blood (routine x 2)     Status: None    Collection Time: 10/11/21  8:12 AM   Specimen: BLOOD  Result Value Ref  Range Status   Specimen Description   Final    BLOOD BLOOD RIGHT HAND Performed at [facility] [address] [place], [place] [address]    Special Requests   Final    BOTTLES DRAWN AEROBIC ONLY Blood Culture adequate volume Performed at [facility] [address] [place], [place] [address]    Culture   Final    NO GROWTH 5 DAYS Performed at [facility], [place] [address], [place], [place] [address]    Report Status 10/16/2021 FINAL  Final  Culture, blood (routine x 2)     Status: None   Collection Time: 10/11/21  8:15 AM   Specimen: BLOOD  Result Value Ref Range Status   Specimen Description   Final    BLOOD RIGHT ANTECUBITAL Performed at [facility] [address] [place], [place] [address]    Special Requests   Final    BOTTLES DRAWN AEROBIC ONLY Blood Culture adequate volume Performed at [facility] [address], [place], [place] [address]    Culture   Final    NO GROWTH 5 DAYS Performed at [facility], [place] [address], [place], [place] [address]    Report Status 10/16/2021 FINAL  Final  Urine Culture     Status: Abnormal   Collection Time: 10/11/21 11:10 AM   Specimen: Urine, Clean Catch  Result Value Ref Range Status   Specimen Description   Final    URINE, CLEAN CATCH Performed at [facility], [place] [address], [place], [place] [address]    Special Requests   Final    NONE Performed at [facility], [place] [address] [place], [place] [address]    Culture (A)  Final    <10,000 COLONIES/mL INSIGNIFICANT GROWTH Performed at [place] [address], [place], [place] [address]    Report Status 10/12/2021 FINAL  Final  Fungus Culture With Stain     Status: None (Preliminary result)   Collection Time: 10/12/21  3:11 PM   Specimen: PATH Cytology  Peritoneal fluid  Result Value Ref Range Status   Fungus Stain Final report  Final    Comment: (NOTE) Performed At: [facility] [address] [place], [place] [id number] Rush Farmer MD [id number]    Fungus (Mycology) Culture PENDING  Incomplete   Fungal Source PERITONEAL  Final    Comment: Performed at [facility], [place] [address], [place], [place] [address]  Body fluid culture w Gram Stain     Status: None   Collection Time: 10/12/21  3:11 PM   Specimen: PATH Cytology Peritoneal fluid  Result Value Ref Range Status   Specimen Description   Final    PERITONEAL Performed at [facility] [address], [place], [place] [address]    Special Requests   Final    NONE Performed at [facility], [place] [address], [place], [place] [address]    Gram Stain   Final    FEW WBC PRESENT, PREDOMINANTLY MONONUCLEAR NO ORGANISMS SEEN    Culture   Final    NO GROWTH 3 DAYS Performed at [facility] [address], [place], [place] [address]    Report Status 10/15/2021 FINAL  Final  Fungus Culture Result     Status: None   Collection Time: 10/12/21  3:11 PM  Result Value Ref Range Status   Result 1 Comment  Final    Comment: (NOTE) KOH/Calcofluor preparation:  no fungus observed. Performed At: [facility] [place], [place] [id number] Rush Farmer MD [id number]       Radiology Studies: No results found.   LOS: 7 days   Antonieta Pert, MD [facility]  10/17/2021, 8:31 AM

## 2021-10-17 NOTE — Plan of Care (Signed)
°  Problem: Activity: Goal: Risk for activity intolerance will decrease Outcome: Progressing   Problem: Nutrition: Goal: Adequate nutrition will be maintained Outcome: Progressing   Problem: Clinical Measurements: Goal: Diagnostic test results will improve Outcome: Progressing

## 2021-10-18 DIAGNOSIS — C20 Malignant neoplasm of rectum: Secondary | ICD-10-CM

## 2021-10-18 DIAGNOSIS — G893 Neoplasm related pain (acute) (chronic): Secondary | ICD-10-CM

## 2021-10-18 DIAGNOSIS — Z515 Encounter for palliative care: Secondary | ICD-10-CM

## 2021-10-18 DIAGNOSIS — C787 Secondary malignant neoplasm of liver and intrahepatic bile duct: Secondary | ICD-10-CM

## 2021-10-18 LAB — CBC WITH DIFFERENTIAL/PLATELET
Abs Immature Granulocytes: 0.06 10*3/uL (ref 0.00–0.07)
Basophils Absolute: 0 10*3/uL (ref 0.0–0.1)
Basophils Relative: 0 %
Eosinophils Absolute: 0 10*3/uL (ref 0.0–0.5)
Eosinophils Relative: 0 %
HCT: 32.9 % — ABNORMAL LOW (ref 39.0–52.0)
Hemoglobin: 10 g/dL — ABNORMAL LOW (ref 13.0–17.0)
Immature Granulocytes: 1 %
Lymphocytes Relative: 5 %
Lymphs Abs: 0.7 10*3/uL (ref 0.7–4.0)
MCH: 25.6 pg — ABNORMAL LOW (ref 26.0–34.0)
MCHC: 30.4 g/dL (ref 30.0–36.0)
MCV: 84.1 fL (ref 80.0–100.0)
Monocytes Absolute: 0.7 10*3/uL (ref 0.1–1.0)
Monocytes Relative: 5 %
Neutro Abs: 11.6 10*3/uL — ABNORMAL HIGH (ref 1.7–7.7)
Neutrophils Relative %: 89 %
Platelets: 468 10*3/uL — ABNORMAL HIGH (ref 150–400)
RBC: 3.91 MIL/uL — ABNORMAL LOW (ref 4.22–5.81)
RDW: 19.3 % — ABNORMAL HIGH (ref 11.5–15.5)
WBC: 13.1 10*3/uL — ABNORMAL HIGH (ref 4.0–10.5)
nRBC: 0 % (ref 0.0–0.2)

## 2021-10-18 MED ORDER — OXYCODONE HCL ER 40 MG PO T12A
60.0000 mg | EXTENDED_RELEASE_TABLET | Freq: Two times a day (BID) | ORAL | Status: DC
  Administered 2021-10-18 – 2021-10-23 (×11): 60 mg via ORAL
  Filled 2021-10-18 (×11): qty 1

## 2021-10-18 NOTE — Progress Notes (Signed)
Daily Progress Note   Patient Name: Jason Mcguire       Date: 10/18/2021 DOB: 06/12/1956  Age: 66 y.o. MRN#: [id number] Attending Physician: Antonieta Pert, MD Primary Care Physician: Kathyrn Lass, MD Admit Date: 10/07/2021  Reason for Consultation/Follow-up: Establishing goals of care and Pain control  Subjective: I saw and examined Jason Mcguire today.    He had friends visiting at the time of my encounter.  He reports that he continues to have pain and inquires about increasing medication.  We reviewed his MAR and he has had need for short acting medication around-the-clock.  This is included 4 mg of IV Dilaudid and 20 mg of oxycodone in the last 24 hours.  This is roughly equivalent to 70 mg of oxycodone and he reports his pain has still not been well controlled with this regimen.   Length of Stay: 8  Current Medications: Scheduled Meds:   amphetamine-dextroamphetamine  20 mg Oral BID WC   feeding supplement  237 mL Oral BID BM   oxyCODONE  60 mg Oral Q12H   sodium chloride flush  3 mL Intravenous Q12H   thiamine  100 mg Oral Daily    Continuous Infusions:  sodium chloride 250 mL (10/08/21 1112)    PRN Meds: sodium chloride, acetaminophen **OR** acetaminophen, albuterol, ALPRAZolam, alum & mag hydroxide-simeth, hydrALAZINE, HYDROmorphone (DILAUDID) injection, oxyCODONE, sodium chloride flush  Physical Exam         General: Alert, awake, in no acute distress.   HEENT: No bruits, no goiter, no JVD Heart: Regular rate and rhythm. No murmur appreciated. Lungs: Good air movement, clear Abdomen: Soft, globally tender, distended, positive bowel sounds.   Ext: No significant edema Skin: Warm and dry Neuro: Grossly intact, nonfocal.   Vital Signs: BP (!) 171/98 (BP Location: Right  Arm)    Pulse 81    Temp 98.3 F (36.8 C) (Oral)    Resp 18    Ht 5\' 6"  (1.676 m)    Wt 53.6 kg    SpO2 98%    BMI 19.07 kg/m  SpO2: SpO2: 98 % O2 Device: O2 Device: Nasal Cannula O2 Flow Rate: O2 Flow Rate (L/min): 2 L/min  Intake/output summary:  Intake/Output Summary (Last 24 hours) at 10/18/2021 2356 Last data filed at 10/18/2021 2156 Gross per 24 hour  Intake  834 ml  Output 550 ml  Net 284 ml    LBM: Last BM Date: 10/18/21 Baseline Weight: Weight: 57.2 kg Most recent weight: Weight: 53.6 kg       Palliative Assessment/Data:    Flowsheet Rows    Flowsheet Row Most Recent Value  Intake Tab   Referral Department Hospitalist  Unit at Time of Referral Oncology Unit  Palliative Care Primary Diagnosis Cancer  Date Notified 10/12/21  Palliative Care Type New Palliative care  Reason for referral Clarify Goals of Care, Pain  Date of Admission 10/07/21  Date first seen by Palliative Care 10/13/21  # of days Palliative referral response time 1 Day(s)  # of days IP prior to Palliative referral 5  Clinical Assessment   Palliative Performance Scale Score 40%  Psychosocial & Spiritual Assessment   Palliative Care Outcomes   Patient/Family meeting held? Yes  Who was at the meeting? patient, sister       Patient Active Problem List   Diagnosis Date Noted   Protein-calorie malnutrition, severe 10/15/2021   Malignant neoplasm metastatic to liver with unknown primary site [facility]) 10/10/2021   Attention deficit disorder with hyperactivity    Anxiety disorder    Ascites, malignant    Weight loss    Anemia    Dehydration    Elevated CK    Elevated lactic acid level    Elevated troponin    Rectal cancer metastasized to liver ([place]) 10/07/2021   COPD (chronic obstructive pulmonary disease) ([place]) 05/06/2011   Smoker 05/06/2011   Chronic rhinitis 05/06/2011    Palliative Care Assessment & Plan   Patient Profile: Jason Mcguire is a 66 year old male with past medical history of  asthma, ADHD and anxiety who presented with shortness of breath and worsened abdominal distention for about 3 weeks.  He was found out elevated LFTs, ascites with CT abdomen pelvis and liver lesions concerning for metastatic cancer.  There are also lesions noted in lungs on imaging.  He underwent paracentesis as well as liver biopsy.  Recommendations/Plan: Pain, cancer related: He reports that overall his pain has been worse and he slept very poorly last night because he felt like he was up all night requesting pain medication.   Increase Oxycontin to 60mg  twice daily.   Oxycodone 10mg  every 3 hours as first line pain medication.    Dilaudid 1mg  IV as second line pain medication to be given 45-60 minutes after oral medication if oral medication is insufficient to relieve pain. Await further planning from oncology.  He is currently invested in plan to pursue any recommended modifying therapy.  Goals of Care and Additional Recommendations: Limitations on Scope of Treatment: Full Scope Treatment  Code Status:    Code Status Orders  (From admission, onward)           Start     Ordered   10/08/21 0031  Full code  Continuous        10/08/21 0031           Code Status History     This patient has a current code status but no historical code status.       Prognosis:  Unable to determine  Discharge Planning: To Be Determined  Care plan was discussed with patient Thank you for allowing the Palliative Medicine Team to assist in the care of this patient.    Micheline Rough, MD  Please contact Palliative Medicine Team phone at [phone] for questions and concerns.

## 2021-10-18 NOTE — Progress Notes (Signed)
PROGRESS NOTE    CANDLER GINSBERG  WOE:[id number] DOB: 03/19/1956 DOA: 10/07/2021 PCP: Kathyrn Lass, MD   Chief Complaint  Patient presents with   Abdominal Pain   Shortness of Breath  Brief Narrative/Hospital Course: JAYSTON TREVINO, 66 y.o. male with PMH of asthma, ADHD, presented with SOB, worsening abdominal distension ongoing for about 3 weeks PTA.Hx of heavy alcohol abuse when he was younger, currently drinks socially. In the ED, noted to have elevated LFTs, ascites with CT abd/pelvis showing liver lesions concerning for metastatic cancer.   Patient was admitted underwent further work-up.  He had paracentesis on 12/23 that showed no signs of infection.Underwent image guided liver biopsy and repeat paracentesis 12/27 Patient continues to have abdominal distention pain issues and being managed with pain management.  Palliative care following. 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  Pain is controlled still has significant abdominal pain discomfort with ascites.  Sister at the bedside.    Assessment & Plan:  Newly diagnosed anal/rectal mass with metastatic adenocarcinoma to liver, peritoneum and possible lungs Malignant ascites-s/p paracentesis x 2 with 1.6 L & 1.9 l: Presentation with  diffuse innumerable bilobar hepatic lesions, abdominal adenopathy and recurrent ascites and abnormal LFTs,multiple solid bilateral pulmonary nodules: S/p abdominal paracentesis 12/23-cytology with reactive cell and subsequent paracentesis again on 12/27 with liver biopsy and biopsy consistent with metastatic disease adenocarcinoma. Suspecting lower rectal mass likely the primary on exam.Overall prognosis is poor in the setting of diffuse metastasis and mild ascites and low performance status,  it is incurable disease but treatable as per oncology patient interested to pursue treatment -underwent colonoscopy with biopsy12/31.plan for IR port placement likely Wednesday.  Patient has ongoing abdominal distention ascites will discuss with oncology-?  retap prior to discharge  Pain management Cancer related pain: Managed by palliative care, continue to adjust the pain regimen, currently on high-dose OxyContin 40 bid and prn oxy PO / Dilaudid iv.Watch for sedation.  Centrilobular emphysema noted in the CT chest.  Leukocytosis likely from metastatic disease.  No obvious infection present. Recent Labs  Lab 10/12/21 0520 10/13/21 0537 10/14/21 0754 10/15/21 0552 10/16/21 0648 10/17/21 0614 10/18/21 0604  WBC 14.6* 12.7* 15.0* 11.3* 9.9 12.8* 13.1*  PROCALCITON 2.52 6.96  --   --   --   --   --      Iron-deficiency anemia likely from malignancy monitor hb.  Iron supplementation upon discharge.  Monitor hemoglobin here  Rectal bleeding episodes from rectal cancer.  Monitor hemoglobin Recent Labs  Lab 10/14/21 0754 10/15/21 0552 10/16/21 0648 10/17/21 0614 10/18/21 0604  HGB 12.6* 10.5* 10.2* 9.6* 10.0*  HCT 42.7 34.4* 33.4* 32.3* 32.9*     Elevated TSH with normal free T4 outpatient follow-up in 3 weeks  Hyponatremia -stable.  Anxiety disorder ADHD: Continue on Xanax  and home adderal.  Goals of care: Palliative care following closely, appreciate input from pain management.  Overall prognosis is poor patient and family understands they like to pursue further treatment of this incurable disease but treatable as per oncology.  Severe malnutrition Nutrition Problem: Severe Malnutrition Etiology: chronic illness Signs/Symptoms: severe fat depletion, severe muscle depletion, energy intake < or equal to 75% for > or equal to 1 month Interventions: Ensure Enlive (each supplement provides 350kcal and 20 grams of protein)   Significant weight loss 61.2 in Sept to  >  53.6   kg-suspected due to malignancy, severe protein malnutrition.  Supplement diet RD eval    DVT prophylaxis: SCDs Start: 10/08/21 0031.  Lovenox held 2/2 biopsy and rectal bleed Code Status:   Code Status: Full Code Family Communication: plan of care discussed with patient and his ssister  at bedside previously, no family at the bedside this morning.  Status is: Inpatient Remains inpatient appropriate because: For ongoing management of abdominal pain ascites and biopsy result Disposition: Currently not medically stable for discharge. Per her sister [place]aren from [place]-patient does not have running water or heat at home, would like to talk with social worker, potentially could go to another sister/s house locally versus return to home.  Objective: Vitals last 24 hrs: Vitals:   10/16/21 1452 10/17/21 0417 10/17/21 1337 10/18/21 0519  BP: 139/82 (!) 146/67 (!) 147/75 132/89  Pulse: 68 85 86 78  Resp: 18 16 20 17   Temp: (!) 97.3 F (36.3 C) 98.2 F (36.8 C) (!) 97.5 F (36.4 C) 97.8 F (36.6 C)  TempSrc: Oral Oral Oral Oral  SpO2: 100% 100% 100% 97%  Weight:      Height:       Weight change:   Intake/Output Summary (Last 24 hours) at 10/18/2021 1214 Last data filed at 10/18/2021 1100 Gross per 24 hour  Intake 828 ml  Output --  Net 828 ml    Net IO Since Admission: -160.09 mL [10/18/21 1214]   Physical Examination: General exam: AAOx 3, thin frail  HEENT:Oral mucosa moist, Ear/Nose WNL grossly, dentition normal. Respiratory system: bilaterally clear, no use of accessory muscle Cardiovascular system: S1 & S2 +, No JVD,. Gastrointestinal system: Abdomen firm moderately distended with generalized tenderness  Nervous System:Alert, awake, moving extremities and grossly nonfocal Extremities: mild edema, distal peripheral pulses palpable.  Skin: No rashes,no icterus. MSK: Normal muscle bulk,tone, power    Medications reviewed:  Scheduled Meds:  amphetamine-dextroamphetamine  20  mg Oral BID WC   feeding supplement  237 mL Oral BID BM   oxyCODONE  40 mg Oral Q12H   sodium chloride flush  3 mL Intravenous Q12H   thiamine  100 mg Oral Daily  Continuous Infusions:  sodium chloride 250 mL (10/08/21 1112)   Diet Order             DIET SOFT Room service appropriate? Yes; Fluid consistency: Thin  Diet effective now                  Nutrition Problem: Severe Malnutrition Etiology: chronic illness Signs/Symptoms: severe fat depletion, severe muscle depletion, energy intake < or equal to 75% for > or equal to 1 month Interventions: Ensure Enlive (each supplement provides 350kcal and 20 grams of protein) Weight change:   Wt Readings from Last 3 Encounters:  10/16/21 53.6 kg  01/25/19 59 kg  06/20/13 61.2 kg  Consultants:see note  Procedures:see note Antimicrobials: Anti-infectives (From admission, onward)    None      Culture/Microbiology    Component Value Date/Time   SDES  10/12/2021 1511    PERITONEAL Performed at [facility], [place] [address], [place], [place] [address]    SPECREQUEST  10/12/2021 1511    NONE Performed at [facility], [place] [address], [place], [place] [address]    CULT  10/12/2021 1511    NO GROWTH 3 DAYS Performed at [facility] [address], [place], [place] [address]    REPTSTATUS 10/15/2021 FINAL 10/12/2021 1511  Other culture-see note  Unresulted Labs (From admission, onward)     Start     Ordered   10/13/21 1455  Occult blood card to lab, stool  ONCE - STAT,   STAT        10/13/21 1458   10/10/21 0500  CBC with Differential/Platelet  Daily,   R     Question:  Specimen collection method  Answer:  Lab=Lab collect   10/09/21 1744   Unscheduled  Occult blood card to lab, stool  As needed,   R      10/08/21 0249          Data Reviewed: I have personally reviewed following labs and imaging studies CBC: Recent Labs  Lab 10/14/21 0754 10/15/21 0552  10/16/21 0648 10/17/21 0614 10/18/21 0604  WBC 15.0* 11.3* 9.9 12.8* 13.1*  NEUTROABS 13.1* 9.2* 7.8* 10.3* 11.6*  HGB 12.6* 10.5* 10.2* 9.6* 10.0*  HCT 42.7 34.4* 33.4* 32.3* 32.9*  MCV 86.8 83.3 82.7 84.3 84.1  PLT 444* 520* 534* 508* 468*    Basic Metabolic Panel: Recent Labs  Lab 10/12/21 0520 10/13/21 0537 10/14/21 0754  NA 135 130* 134*  K 4.5 4.6 4.3  CL 98 97* 95*  CO2 27 25 27   GLUCOSE 89 115* 90  BUN 14 16 16   CREATININE 0.54* 0.69 0.58*  CALCIUM 8.6* 8.2* 8.7*    GFR: Estimated Creatinine Clearance: 69.8 mL/min (A) (by C-G formula based on SCr of 0.58 mg/dL (L)). Liver Function Tests: Recent Labs  Lab 10/12/21 0520 10/13/21 0537 10/14/21 0754  AST 329* 323* 125*  ALT 61* 65* 46*  ALKPHOS 414* 457* 457*  BILITOT 1.1 1.0 1.1  PROT 6.8 6.3* 6.8  ALBUMIN 2.5* 2.3* 2.3*    No results for input(s): LIPASE, AMYLASE in the last 168 hours.  No results for input(s): AMMONIA in the last 168 hours.  Coagulation Profile: Recent Labs  Lab 10/12/21 0520  INR 1.2    Cardiac Enzymes: No results for input(s): CKTOTAL, CKMB, CKMBINDEX, TROPONINI in the last 168 hours.  BNP (last 3 results) No results for input(s): PROBNP in the last 8760 hours. HbA1C: No results for input(s): HGBA1C in the last 72 hours. CBG: No results for input(s): GLUCAP in the last 168 hours. Lipid Profile: No results for input(s): CHOL, HDL, LDLCALC, TRIG, CHOLHDL, LDLDIRECT in the last 72 hours. Thyroid Function Tests: No results for input(s): TSH, T4TOTAL, FREET4, T3FREE, THYROIDAB in the last 72 hours. Anemia Panel: No results for input(s): VITAMINB12, FOLATE, FERRITIN, TIBC, IRON, RETICCTPCT in the last 72 hours. Sepsis Labs: Recent Labs  Lab 10/12/21 0520 10/13/21 0537  PROCALCITON 2.52 6.96     Recent Results (from the past 240 hour(s))  Culture, blood (routine x 2)     Status: None   Collection Time: 10/11/21  8:12 AM   Specimen: BLOOD  Result Value Ref Range  Status   Specimen Description   Final    BLOOD BLOOD RIGHT HAND Performed at [facility] [address] [place], [place] [address]    Special Requests   Final    BOTTLES DRAWN AEROBIC ONLY Blood Culture adequate volume Performed at [facility] [address] [place], [place] [address]    Culture   Final    NO GROWTH 5 DAYS Performed at [facility], [place] [address], [place], [place] [address]    Report Status 10/16/2021 FINAL  Final  Culture, blood (routine x 2)  Status: None   Collection Time: 10/11/21  8:15 AM   Specimen: BLOOD  Result Value Ref Range Status   Specimen Description   Final    BLOOD RIGHT ANTECUBITAL Performed at [facility] [address] [place], [place] [address]    Special Requests   Final    BOTTLES DRAWN AEROBIC ONLY Blood Culture adequate volume Performed at [facility] [address], [place], [place] [address]    Culture   Final    NO GROWTH 5 DAYS Performed at [facility], [place] [address] [place], [place] [address]    Report Status 10/16/2021 FINAL  Final  Urine Culture     Status: Abnormal   Collection Time: 10/11/21 11:10 AM   Specimen: Urine, Clean Catch  Result Value Ref Range Status   Specimen Description   Final    URINE, CLEAN CATCH Performed at [facility], [place] [address] [place], [place] [address]    Special Requests   Final    NONE Performed at [facility], [place] [address] [place], [place] [address]    Culture (A)  Final    <10,000 COLONIES/mL INSIGNIFICANT GROWTH Performed at [facility] [address], [place], [place] [address]    Report Status 10/12/2021 FINAL  Final  Fungus Culture With Stain     Status: None (Preliminary result)   Collection Time: 10/12/21  3:11 PM   Specimen: PATH Cytology Peritoneal fluid  Result Value Ref Range Status   Fungus Stain Final report  Final     Comment: (NOTE) Performed At: [facility] [address], [place] [id number] Rush Farmer MD TD:[id number]    Fungus (Mycology) Culture PENDING  Incomplete   Fungal Source PERITONEAL  Final    Comment: Performed at [facility], [place] [address], [place], [place] [address]  Body fluid culture w Gram Stain     Status: None   Collection Time: 10/12/21  3:11 PM   Specimen: PATH Cytology Peritoneal fluid  Result Value Ref Range Status   Specimen Description   Final    PERITONEAL Performed at [place] [facility] [address], [place], [place] [address]    Special Requests   Final    NONE Performed at [facility], [place] [address], [place], [place] [address]    Gram Stain   Final    FEW WBC PRESENT, PREDOMINANTLY MONONUCLEAR NO ORGANISMS SEEN    Culture   Final    NO GROWTH 3 DAYS Performed at [facility] [address], [place], [place] [address]    Report Status 10/15/2021 FINAL  Final  Fungus Culture Result     Status: None   Collection Time: 10/12/21  3:11 PM  Result Value Ref Range Status   Result 1 Comment  Final    Comment: (NOTE) KOH/Calcofluor preparation:  no fungus observed. Performed At: [facility] [place], [place] [id number] Rush Farmer MD OE:[id number]       Radiology Studies: No results found.   LOS: 8 days   Antonieta Pert, MD [facility]  10/18/2021, 12:14 PM

## 2021-10-18 NOTE — Progress Notes (Signed)
Daily Progress Note   Patient Name: Jason Mcguire       Date: 10/18/2021 DOB: December 02, 1955  Age: 66 y.o. MRN#: [id number] Attending Physician: Antonieta Pert, MD Primary Care Physician: Kathyrn Lass, MD Admit Date: 10/07/2021  Reason for Consultation/Follow-up: Establishing goals of care and Pain control  Subjective: I saw and examined Jason Mcguire today.    He reports that he continues to have up-and-down periods whenever he thinks about the reality that he has metastatic cancer.  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  We discussed pain management.  He reports having some more lower abdominal pain today.  MAR reviewed and he has utilized 6 doses of IV Dilaudid in the last 24 hours in addition to 3 doses of 10 mg of short acting oxycodone and 2 doses of 40 mg of OxyContin.  This is a total oral morphine equivalent of 285 mg over the last 24 hours.  At this is a significant increase from the day prior, we discussed plan to continue with current regimen and monitor closely over the next day or so.  He is not sure if this is related to colonoscopy prep and procedure or something else.  If needs continue to be elevated, we will readjust his short and long-acting medications.  Length of Stay: 8  Current Medications: Scheduled Meds:   amphetamine-dextroamphetamine  20 mg Oral BID WC   feeding supplement  237 mL Oral BID BM   oxyCODONE  40 mg Oral Q12H   sodium chloride flush  3 mL Intravenous Q12H   thiamine  100 mg Oral Daily    Continuous Infusions:  sodium chloride 250 mL (10/08/21 1112)    PRN Meds: sodium chloride, acetaminophen **OR** acetaminophen, albuterol, ALPRAZolam, alum & mag  hydroxide-simeth, hydrALAZINE, HYDROmorphone (DILAUDID) injection, oxyCODONE, sodium chloride flush  Physical Exam         General: Alert, awake, in no acute distress.   HEENT: No bruits, no goiter, no JVD Heart: Regular rate and rhythm. No murmur appreciated. Lungs: Good air movement, clear Abdomen: Soft, globally tender, distended, positive bowel sounds.   Ext: No significant edema Skin: Warm and dry Neuro: Grossly intact, nonfocal.   Vital Signs: BP 132/89 (BP Location: Right  Arm)    Pulse 78    Temp 97.8 F (36.6 C) (Oral)    Resp 17    Ht 5\' 6"  (1.676 m)    Wt 53.6 kg    SpO2 97%    BMI 19.07 kg/m  SpO2: SpO2: 97 % O2 Device: O2 Device: Nasal Cannula O2 Flow Rate: O2 Flow Rate (L/min): 2 L/min  Intake/output summary:  Intake/Output Summary (Last 24 hours) at 10/18/2021 0849 Last data filed at 10/17/2021 1844 Gross per 24 hour  Intake 475 ml  Output 500 ml  Net -25 ml    LBM: Last BM Date: 10/16/21 Baseline Weight: Weight: 57.2 kg Most recent weight: Weight: 53.6 kg       Palliative Assessment/Data:    Flowsheet Rows    Flowsheet Row Most Recent Value  Intake Tab   Referral Department Hospitalist  Unit at Time of Referral Oncology Unit  Palliative Care Primary Diagnosis Cancer  Date Notified 10/12/21  Palliative Care Type New Palliative care  Reason for referral Clarify Goals of Care, Pain  Date of Admission 10/07/21  Date first seen by Palliative Care 10/13/21  # of days Palliative referral response time 1 Day(s)  # of days IP prior to Palliative referral 5  Clinical Assessment   Palliative Performance Scale Score 40%  Psychosocial & Spiritual Assessment   Palliative Care Outcomes   Patient/Family meeting held? Yes  Who was at the meeting? patient, sister       Patient Active Problem List   Diagnosis Date Noted   Protein-calorie malnutrition, severe 10/15/2021   Malignant neoplasm metastatic to liver with unknown primary site [facility]) 10/10/2021    Attention deficit disorder with hyperactivity    Anxiety disorder    Ascites, malignant    Weight loss    Anemia    Dehydration    Elevated CK    Elevated lactic acid level    Elevated troponin    Rectal cancer metastasized to liver ([place]) 10/07/2021   COPD (chronic obstructive pulmonary disease) ([place]) 05/06/2011   Smoker 05/06/2011   Chronic rhinitis 05/06/2011    Palliative Care Assessment & Plan   Patient Profile: Jason Mcguire is a 65 year old male with past medical history of asthma, ADHD and anxiety who presented with shortness of breath and worsened abdominal distention for about 3 weeks.  He was found out elevated LFTs, ascites with CT abdomen pelvis and liver lesions concerning for metastatic cancer.  There are also lesions noted in lungs on imaging.  He underwent paracentesis as well as liver biopsy.  Recommendations/Plan: Pain, cancer related: He reports that overall his pain has been well controlled on current regimen.  Continue same regimen for now, however, his needs have increased over the past 24 hours.  Question if this is related to colonoscopy prep and procedure and or if he does require higher dose medications more long-term.  Current regimen includes: Oxycontin to 40mg  twice daily.   Oxycodone 10mg  every 3 hours as first line pain medication.    Dilaudid 1mg  IV as second line pain medication to be given 45-60 minutes after oral medication if oral medication is insufficient to relieve pain. Await further planning from oncology.  He is currently invested in plan to pursue any recommended modifying therapy.  Goals of Care and Additional Recommendations: Limitations on Scope of Treatment: Full Scope Treatment  Code Status:    Code Status Orders  (From admission, onward)           Start  Ordered   10/08/21 0031  Full code  Continuous        10/08/21 0031           Code Status History     This patient has a current code status but no historical code  status.       Prognosis:  Unable to determine  Discharge Planning: To Be Determined  Care plan was discussed with patient Thank you for allowing the Palliative Medicine Team to assist in the care of this patient.    Micheline Rough, MD  Please contact Palliative Medicine Team phone at [phone] for questions and concerns.

## 2021-10-18 NOTE — Progress Notes (Signed)
PT Cancellation Note  Patient Details Name: Jason Mcguire MRN: [id number] DOB: [birth year]   Cancelled Treatment:    Reason Eval/Treat Not Completed: Other (comment)has visitors at this time. Will check back another time.  [place] Pager [phone] Office [phone]    Claretha Cooper 10/18/2021, 3:50 PM

## 2021-10-19 ENCOUNTER — Inpatient Hospital Stay (HOSPITAL_COMMUNITY): Payer: PPO

## 2021-10-19 ENCOUNTER — Encounter (HOSPITAL_COMMUNITY): Payer: Self-pay | Admitting: Gastroenterology

## 2021-10-19 DIAGNOSIS — C20 Malignant neoplasm of rectum: Secondary | ICD-10-CM

## 2021-10-19 DIAGNOSIS — Z515 Encounter for palliative care: Secondary | ICD-10-CM

## 2021-10-19 DIAGNOSIS — C787 Secondary malignant neoplasm of liver and intrahepatic bile duct: Secondary | ICD-10-CM

## 2021-10-19 LAB — CBC WITH DIFFERENTIAL/PLATELET
Abs Immature Granulocytes: 0.05 10*3/uL (ref 0.00–0.07)
Basophils Absolute: 0.1 10*3/uL (ref 0.0–0.1)
Basophils Relative: 0 %
Eosinophils Absolute: 0 10*3/uL (ref 0.0–0.5)
Eosinophils Relative: 0 %
HCT: 32.5 % — ABNORMAL LOW (ref 39.0–52.0)
Hemoglobin: 10 g/dL — ABNORMAL LOW (ref 13.0–17.0)
Immature Granulocytes: 0 %
Lymphocytes Relative: 6 %
Lymphs Abs: 0.7 10*3/uL (ref 0.7–4.0)
MCH: 25.8 pg — ABNORMAL LOW (ref 26.0–34.0)
MCHC: 30.8 g/dL (ref 30.0–36.0)
MCV: 83.8 fL (ref 80.0–100.0)
Monocytes Absolute: 0.9 10*3/uL (ref 0.1–1.0)
Monocytes Relative: 7 %
Neutro Abs: 10.1 10*3/uL — ABNORMAL HIGH (ref 1.7–7.7)
Neutrophils Relative %: 87 %
Platelets: 448 10*3/uL — ABNORMAL HIGH (ref 150–400)
RBC: 3.88 MIL/uL — ABNORMAL LOW (ref 4.22–5.81)
RDW: 19.5 % — ABNORMAL HIGH (ref 11.5–15.5)
WBC: 11.8 10*3/uL — ABNORMAL HIGH (ref 4.0–10.5)
nRBC: 0 % (ref 0.0–0.2)

## 2021-10-19 MED ORDER — LIDOCAINE HCL 1 % IJ SOLN
INTRAMUSCULAR | Status: AC
  Administered 2021-10-19: 10 mL
  Filled 2021-10-19: qty 20

## 2021-10-19 NOTE — TOC Initial Note (Signed)
Transition of Care [facility]) - Initial/Assessment Note    Patient Details  Name: Jason Mcguire MRN: [id number] Date of Birth: 1956/01/11  Transition of Care [facility]) CM/SW Contact:    Brent Noto, Marjie Skiff, RN Phone Number: 10/19/2021, 2:22 PM  Clinical Narrative:                 [facility] consult for "sister has concerns about the patient being able to go home.  He lacks running water and heat is insufficient". Spoke with pt at bedside to inquire about sister's concerns. Pt states that he is not concerned about either of those things right now. He said he believes that the heat issue is just a breaker that needs to be flipped. He said that he is unsure if he has water or not that the pipes might have frozen but if he gets home and has a water leak he will call a plumber and go to his sister's house if he has to. He states he has several friend's that he can call for help as well. He states that he will speak to his sister.   Unsure if pt will need home 02. He is currently on 02 in the hospital. TOC will follow along for dc needs.  Expected Discharge Plan: Home/Self Care Barriers to Discharge: Continued Medical Work up   Patient Goals and CMS Choice Patient states their goals for this hospitalization and ongoing recovery are:: To go home      Expected Discharge Plan and Services Expected Discharge Plan: Home/Self Care   Discharge Planning Services: CM Consult   Living arrangements for the past 2 months: Single Family Home                   Prior Living Arrangements/Services Living arrangements for the past 2 months: Single Family Home Lives with:: Self Patient language and need for interpreter reviewed:: Yes Do you feel safe going back to the place where you live?: Yes      Need for Family Participation in Patient Care: Yes (Comment) Care giver support system in place?: Yes (comment)   Criminal Activity/Legal Involvement Pertinent to Current Situation/Hospitalization: No - Comment as  needed  Activities of Daily Living Home Assistive Devices/Equipment: None ADL Screening (condition at time of admission) Patient's cognitive ability adequate to safely complete daily activities?: Yes Is the patient deaf or have difficulty hearing?: No Does the patient have difficulty seeing, even when wearing glasses/contacts?: No Does the patient have difficulty concentrating, remembering, or making decisions?: No Patient able to express need for assistance with ADLs?: Yes Does the patient have difficulty dressing or bathing?: No Independently performs ADLs?: Yes (appropriate for developmental age) Does the patient have difficulty walking or climbing stairs?: No Weakness of Legs: Both Weakness of Arms/Hands: None  Permission Sought/Granted                  Emotional Assessment Appearance:: Appears older than stated age Attitude/Demeanor/Rapport: Engaged Affect (typically observed): Anxious Orientation: : Oriented to Self, Oriented to Place, Oriented to  Time, Oriented to Situation Alcohol / Substance Use: Not Applicable Psych Involvement: No (comment)  Admission diagnosis:  Malignant neoplasm metastatic to liver [facility]) [C78.7] Malignant neoplasm metastatic to liver with unknown primary site [facility]) [C78.7, C80.1] Patient Active Problem List   Diagnosis Date Noted   Palliative care by specialist    Protein-calorie malnutrition, severe 10/15/2021   Malignant neoplasm metastatic to liver with unknown primary site [facility]) 10/10/2021   Attention deficit disorder with  hyperactivity    Anxiety disorder    Ascites, malignant    Weight loss    Anemia    Dehydration    Elevated CK    Elevated lactic acid level    Elevated troponin    Rectal cancer metastasized to liver ([place]) 10/07/2021   COPD (chronic obstructive pulmonary disease) ([place]) 05/06/2011   Smoker 05/06/2011   Chronic rhinitis 05/06/2011   PCP:  Kathyrn Lass, MD Pharmacy:   [id number] - [place], [place]. AT [address] [place]. [place] [address] Phone: [phone] Fax: [id number] - [place], [place] - [address] AT [place] [address] [place] [address] Phone: [phone] Fax: [phone]     Social Determinants of Health (SDOH) Interventions    Readmission Risk Interventions Readmission Risk Prevention Plan 10/19/2021  Transportation Screening Complete  PCP or Specialist Appt within 5-7 Days Complete  Home Care Screening Complete  Medication Review (RN CM) Complete  Some recent data might be hidden

## 2021-10-19 NOTE — Progress Notes (Signed)
PT Cancellation Note  Patient Details Name: Jason Mcguire MRN: [id number] DOB: October 05, 1956   Cancelled Treatment:     Pt downstairs for a procedure - Paracentesis Will attempt to see another day   Rica Koyanagi  PTA Acute  Rehabilitation Services Pager      [phone] Office      [phone]

## 2021-10-19 NOTE — Progress Notes (Signed)
Patient ID: TIGHE GITTO, male   DOB: 1956/03/16, 66 y.o.   MRN: [id number] Pt scheduled for port a cath placement tomorrow.Consult note done 10/15/21.  Risks and benefits of image guided port-a-catheter placement was discussed with the patient /sister including, but not limited to bleeding, infection, pneumothorax, or fibrin sheath development and need for additional procedures.  All of the patient's questions were answered, patient is agreeable to proceed. Consent signed and in IR.

## 2021-10-19 NOTE — Progress Notes (Signed)
PROGRESS NOTE    DAYDEN VIVERETTE  MPN:[id number] DOB: [birth year] DOA: 10/07/2021 PCP: Kathyrn Lass, MD   Chief Complaint  Patient presents with   Abdominal Pain   Shortness of Breath  Brief Narrative/Hospital Course: COY ROCHFORD, 66 y.o. male with PMH of asthma, ADHD, presented with SOB, worsening abdominal distension ongoing for about 3 weeks PTA.Hx of heavy alcohol abuse when he was younger, currently drinks socially. In the ED, noted to have elevated LFTs, ascites with CT abd/pelvis showing liver lesions concerning for metastatic cancer.   Patient was admitted underwent further work-up.  He had paracentesis on 12/23 that showed no signs of infection.Underwent image guided liver biopsy and repeat paracentesis 12/27 Patient continues to have abdominal distention pain issues and being managed with pain management.  Palliative care following. Liver biopsy-resulted with adenocarcinoma consistent with metastatic colorectal adenocarcinoma He had intermittent bloating this to, exam showed rectal mass underwent colonoscopy and biopsy for anal and rectal mass  Subjective:  Complains of ongoing abdominal pain but well controlled on current pain management, distended abdomen, having episode of rectal bleeding at times.    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 Oncology following, plan is for Port-A-Cath placement tomorrow.  Had 2 acetic tapping done already may benefit additional tap given abdominal distention and pain.  Overall poor prognosis.  Rectal cancer having intermittent bleeding. Continue current pain management as per palliative care with OxyContin and pen meds  Pain management Cancer related pain: Managed by palliative care, continue to adjust the pain regimen, currently on high-dose OxyContin increased to 60 bid 1/2-  cont prn oxy PO / Dilaudid iv.Watch for sedation.  Tolerating well and pain is controlled.  Centrilobular emphysema noted in the CT chest.  Leukocytosis likely from metastatic disease.  No obvious infection present. Recent Labs  Lab 10/13/21 0537 10/14/21 0754 10/15/21 0552 10/16/21 0648 10/17/21 0614 10/18/21 0604 10/19/21 0452  WBC 12.7*   < > 11.3* 9.9 12.8* 13.1* 11.8*  PROCALCITON 6.96  --   --   --   --   --   --    < > = values in this interval not displayed.     Iron-deficiency anemia likely from malignancy monitor hb.  Iron supplementation upon discharge.  Monitor hemoglobin here  Rectal bleeding episodes from rectal cancer.  Stable hemoglobin Recent Labs  Lab 10/15/21 0552 10/16/21 0648 10/17/21 0614 10/18/21 0604 10/19/21 0452  HGB 10.5* 10.2* 9.6* 10.0* 10.0*  HCT 34.4* 33.4* 32.3* 32.9* 32.5*     Elevated TSH with normal free T4 outpatient follow-up in 3 weeks  Hyponatremia -stable.  Anxiety disorder ADHD: Continue on Xanax  and home adderal.  Goals of care: Palliative care following closely, appreciate input from pain management.  Overall prognosis is poor patient and family understands they like to pursue further treatment of this incurable disease but treatable as per oncology.  Severe malnutrition Nutrition Problem: Severe Malnutrition Etiology: chronic illness Signs/Symptoms: severe fat depletion, severe muscle depletion, energy intake < or equal to 75% for > or equal to 1 month Interventions: Ensure Enlive (each supplement provides 350kcal and 20 grams of protein)   Significant weight loss 61.2 in Sept to  > 53.6  kg-suspected due to malignancy, severe protein malnutrition.  Supplement diet RD eval    DVT prophylaxis: SCDs Start: 10/08/21 0031.  Lovenox held  2/2 biopsy and rectal bleed Code Status:   Code Status: Full Code Family Communication: plan of care discussed with patient and his ssister  at bedside previously, no family at the bedside  this morning.  Status is: Inpatient Remains inpatient appropriate because: For ongoing management of abdominal pain ascites and biopsy result Disposition: Currently not medically stable for discharge. Per her sister [place]aren from [place]-patient does not have running water or heat at home, would like to talk with social worker, potentially could go to another sister/s house locally versus return to home.  Hopefully discharge after Port-A-Cath placement if okay with oncology  Objective: Vitals last 24 hrs: Vitals:   10/18/21 2028 10/19/21 0457 10/19/21 1140 10/19/21 1152  BP: (!) 171/98 (!) 132/92 (!) 137/91 131/70  Pulse: 81 83    Resp: 18 16    Temp: 98.3 F (36.8 C) 98.5 F (36.9 C)    TempSrc: Oral Oral    SpO2: 98% 97%    Weight:      Height:       Weight change:   Intake/Output Summary (Last 24 hours) at 10/19/2021 1204 Last data filed at 10/18/2021 2156 Gross per 24 hour  Intake 481 ml  Output 550 ml  Net -69 ml    Net IO Since Admission: -229.09 mL [10/19/21 1204]   Physical Examination: General exam: AAOx 3, thin, frail, weak appearing. HEENT:Oral mucosa moist, Ear/Nose WNL grossly, dentition normal. Respiratory system: bilaterally air, no use of accessory muscle Cardiovascular system: S1 & S2 +, No JVD,. Gastrointestinal system: Abdomen firm and moderately distended with generalized tenderness Nervous System:Alert, awake, moving extremities and grossly nonfocal Extremities: no edema, distal peripheral pulses palpable.  Skin: No rashes,no icterus. MSK: Normal muscle bulk,tone, power     Medications reviewed:  Scheduled Meds:  amphetamine-dextroamphetamine  20 mg Oral BID WC   feeding supplement  237 mL Oral BID BM   oxyCODONE  60 mg Oral Q12H   sodium chloride flush  3 mL Intravenous Q12H   thiamine  100 mg Oral Daily  Continuous Infusions:  sodium chloride 250 mL (10/08/21 1112)   Diet Order             DIET SOFT Room service appropriate? Yes; Fluid  consistency: Thin  Diet effective now                  Nutrition Problem: Severe Malnutrition Etiology: chronic illness Signs/Symptoms: severe fat depletion, severe muscle depletion, energy intake < or equal to 75% for > or equal to 1 month Interventions: Ensure Enlive (each supplement provides 350kcal and 20 grams of protein) Weight change:   Wt Readings from Last 3 Encounters:  10/16/21 53.6 kg  01/25/19 59 kg  06/20/13 61.2 kg  Consultants:see note  Procedures:see note Antimicrobials: Anti-infectives (From admission, onward)    None      Culture/Microbiology    Component Value Date/Time   SDES  10/12/2021 1511    PERITONEAL Performed at [facility], [place] [address], [place], [place] [address]    SPECREQUEST  10/12/2021 1511    NONE Performed at [facility], [place] [address] [place], [place] [address]    CULT  10/12/2021 1511    NO GROWTH 3 DAYS Performed at [place] [address] [place], [place] [address]    REPTSTATUS 10/15/2021 FINAL 10/12/2021 1511    Other culture-see note  Unresulted Labs (From admission, onward)     Start  Ordered   10/13/21 1455  Occult blood card to lab, stool  ONCE - STAT,   STAT        10/13/21 1458   10/10/21 0500  CBC with Differential/Platelet  Daily,   R     Question:  Specimen collection method  Answer:  Lab=Lab collect   10/09/21 1744   Unscheduled  Occult blood card to lab, stool  As needed,   R      10/08/21 0249          Data Reviewed: I have personally reviewed following labs and imaging studies CBC: Recent Labs  Lab 10/15/21 0552 10/16/21 0648 10/17/21 0614 10/18/21 0604 10/19/21 0452  WBC 11.3* 9.9 12.8* 13.1* 11.8*  NEUTROABS 9.2* 7.8* 10.3* 11.6* 10.1*  HGB 10.5* 10.2* 9.6* 10.0* 10.0*  HCT 34.4* 33.4* 32.3* 32.9* 32.5*  MCV 83.3 82.7 84.3 84.1 83.8  PLT 520* 534* 508* 468* 448*    Basic Metabolic Panel: Recent Labs  Lab 10/13/21 0537  10/14/21 0754  NA 130* 134*  K 4.6 4.3  CL 97* 95*  CO2 25 27  GLUCOSE 115* 90  BUN 16 16  CREATININE 0.69 0.58*  CALCIUM 8.2* 8.7*    GFR: Estimated Creatinine Clearance: 69.8 mL/min (A) (by C-G formula based on SCr of 0.58 mg/dL (L)). Liver Function Tests: Recent Labs  Lab 10/13/21 0537 10/14/21 0754  AST 323* 125*  ALT 65* 46*  ALKPHOS 457* 457*  BILITOT 1.0 1.1  PROT 6.3* 6.8  ALBUMIN 2.3* 2.3*    No results for input(s): LIPASE, AMYLASE in the last 168 hours.  No results for input(s): AMMONIA in the last 168 hours.  Coagulation Profile: No results for input(s): INR, PROTIME in the last 168 hours.  Cardiac Enzymes: No results for input(s): CKTOTAL, CKMB, CKMBINDEX, TROPONINI in the last 168 hours.  BNP (last 3 results) No results for input(s): PROBNP in the last 8760 hours. HbA1C: No results for input(s): HGBA1C in the last 72 hours. CBG: No results for input(s): GLUCAP in the last 168 hours. Lipid Profile: No results for input(s): CHOL, HDL, LDLCALC, TRIG, CHOLHDL, LDLDIRECT in the last 72 hours. Thyroid Function Tests: No results for input(s): TSH, T4TOTAL, FREET4, T3FREE, THYROIDAB in the last 72 hours. Anemia Panel: No results for input(s): VITAMINB12, FOLATE, FERRITIN, TIBC, IRON, RETICCTPCT in the last 72 hours. Sepsis Labs: Recent Labs  Lab 10/13/21 0537  PROCALCITON 6.96     Recent Results (from the past 240 hour(s))  Culture, blood (routine x 2)     Status: None   Collection Time: 10/11/21  8:12 AM   Specimen: BLOOD  Result Value Ref Range Status   Specimen Description   Final    BLOOD BLOOD RIGHT HAND Performed at [facility], [place] [address], [place], [place] [address]    Special Requests   Final    BOTTLES DRAWN AEROBIC ONLY Blood Culture adequate volume Performed at [place] [address], [place], [place] [address]    Culture   Final    NO GROWTH 5 DAYS Performed at [facility], [place] [address] [place], [place] [address]    Report Status 10/16/2021 FINAL  Final  Culture, blood (routine x 2)     Status: None   Collection Time: 10/11/21  8:15 AM   Specimen: BLOOD  Result Value Ref Range Status   Specimen Description   Final    BLOOD RIGHT ANTECUBITAL Performed at [facility], [place] [address], [place], [place] [address]    Special Requests   Final    BOTTLES DRAWN AEROBIC ONLY Blood Culture adequate volume Performed at [place] [address] [place], [place] [address]    Culture   Final    NO GROWTH 5 DAYS Performed at [facility], [place] [address], [place], [place] [address]    Report Status 10/16/2021 FINAL  Final  Urine Culture     Status: Abnormal   Collection Time: 10/11/21 11:10 AM   Specimen: Urine, Clean Catch  Result Value Ref Range Status   Specimen Description   Final    URINE, CLEAN CATCH Performed at [facility], [place] [address], [place], [place] [address]    Special Requests   Final    NONE Performed at [facility], [place] [address], [place], [place] [address]    Culture (A)  Final    <10,000 COLONIES/mL INSIGNIFICANT GROWTH Performed at [place] [address] [place], [place] [address]    Report Status 10/12/2021 FINAL  Final  Fungus Culture With Stain     Status: None (Preliminary result)   Collection Time: 10/12/21  3:11 PM   Specimen: PATH Cytology Peritoneal fluid  Result Value Ref Range Status   Fungus Stain Final report  Final    Comment: (NOTE) Performed At: [facility] [address] [place], [place] [id number] Rush Farmer MD PI:[id number]    Fungus (Mycology) Culture PENDING  Incomplete   Fungal Source PERITONEAL  Final    Comment: Performed at [facility], [place] [address] [place], [place] [address]  Body fluid culture w Gram Stain     Status: None   Collection Time: 10/12/21  3:11  PM   Specimen: PATH Cytology Peritoneal fluid  Result Value Ref Range Status   Specimen Description   Final    PERITONEAL Performed at [place] [address], [place], [place] [address]    Special Requests   Final    NONE Performed at [facility], [place] [address] [place],  [address]    Gram Stain   Final    FEW WBC PRESENT, PREDOMINANTLY MONONUCLEAR NO ORGANISMS SEEN    Culture   Final    NO GROWTH 3 DAYS Performed at [place] [address], [place],  [address]    Report Status 10/15/2021 FINAL  Final  Fungus Culture Result     Status: None   Collection Time: 10/12/21  3:11 PM  Result Value Ref Range Status   Result 1 Comment  Final    Comment: (NOTE) KOH/Calcofluor preparation:  no fungus observed. Performed At: [facility] [place], [place] [id number] Rush Farmer MD SE:[id number]       Radiology Studies: No results found.   LOS: 9 days   Antonieta Pert, MD [facility]  10/19/2021, 12:04 PM

## 2021-10-19 NOTE — Progress Notes (Signed)
Daily Progress Note   Patient Name: Jason Mcguire       Date: 10/19/2021 DOB: 22-Oct-1955  Age: 66 y.o. MRN#: [id number] Attending Physician: Antonieta Pert, MD Primary Care Physician: Kathyrn Lass, MD Admit Date: 10/07/2021  Reason for Consultation/Follow-up: Establishing goals of care and Pain control  Subjective: I saw and examined Jason Mcguire today.    His pain is better controlled, he is asking about his paracentesis.   Medication history noted, discussed with patient.    Length of Stay: 9  Current Medications: Scheduled Meds:   amphetamine-dextroamphetamine  20 mg Oral BID WC   feeding supplement  237 mL Oral BID BM   oxyCODONE  60 mg Oral Q12H   sodium chloride flush  3 mL Intravenous Q12H   thiamine  100 mg Oral Daily    Continuous Infusions:  sodium chloride 250 mL (10/08/21 1112)    PRN Meds: sodium chloride, acetaminophen **OR** acetaminophen, albuterol, ALPRAZolam, alum & mag hydroxide-simeth, hydrALAZINE, HYDROmorphone (DILAUDID) injection, oxyCODONE, sodium chloride flush  Physical Exam         General: Alert, awake, in no acute distress.   HEENT: No bruits, no goiter, no JVD Heart: Regular rate and rhythm. No murmur appreciated. Lungs: Good air movement, clear Abdomen: Soft, globally tender, distended, positive bowel sounds.   Ext: No significant edema Skin: Warm and dry Neuro: Grossly intact, nonfocal.   Vital Signs: BP 112/82    Pulse 83    Temp 98.5 F (36.9 C) (Oral)    Resp 16    Ht 5\' 6"  (1.676 m)    Wt 53.6 kg    SpO2 97%    BMI 19.07 kg/m  SpO2: SpO2: 97 % O2 Device: O2 Device: Room Air O2 Flow Rate: O2 Flow Rate (L/min): 2 L/min  Intake/output summary:  Intake/Output Summary (Last 24 hours) at 10/19/2021 1221 Last data filed at 10/18/2021  2156 Gross per 24 hour  Intake 481 ml  Output 550 ml  Net -69 ml    LBM: Last BM Date: 10/18/20 Baseline Weight: Weight: 57.2 kg Most recent weight: Weight: 53.6 kg       Palliative Assessment/Data:    Flowsheet Rows    Flowsheet Row Most Recent Value  Intake Tab   Referral Department Hospitalist  Unit at Time of Referral Oncology Unit  Palliative Care Primary Diagnosis Cancer  Date Notified 10/12/21  Palliative Care Type New Palliative care  Reason for referral Clarify Goals of Care, Pain  Date of Admission 10/07/21  Date first seen by Palliative Care 10/13/21  # of days Palliative referral response time 1 Day(s)  # of days IP prior to Palliative referral 5  Clinical Assessment   Palliative Performance Scale Score 40%  Psychosocial & Spiritual Assessment   Palliative Care Outcomes   Patient/Family meeting held? Yes  Who was at the meeting? patient, sister       Patient Active Problem List   Diagnosis Date Noted   Protein-calorie malnutrition, severe 10/15/2021   Malignant neoplasm metastatic to liver with unknown primary site [facility]) 10/10/2021   Attention deficit disorder with hyperactivity    Anxiety disorder    Ascites, malignant    Weight loss    Anemia    Dehydration    Elevated CK    Elevated lactic acid level    Elevated troponin    Rectal cancer metastasized to liver ([place]) 10/07/2021   COPD (chronic obstructive pulmonary disease) ([place]) 05/06/2011   Smoker 05/06/2011   Chronic rhinitis 05/06/2011    Palliative Care Assessment & Plan   Patient Profile: Jason Mcguire is a 66 year old male with past medical history of asthma, ADHD and anxiety who presented with shortness of breath and worsened abdominal distention for about 3 weeks.  He was found out elevated LFTs, ascites with CT abdomen pelvis and liver lesions concerning for metastatic cancer.  There are also lesions noted in lungs on imaging.  He underwent paracentesis as well as liver  biopsy.  Recommendations/Plan: Pain, cancer related:   Continue OxyContin to 60mg  twice daily.   Oxycodone 10mg  every 3 hours as first line pain medication.    Dilaudid 1mg  IV as second line pain medication to be given 45-60 minutes after oral medication if oral medication is insufficient to relieve pain.  Likely home in the next couple of days.   Goals of Care and Additional Recommendations: Limitations on Scope of Treatment: Full Scope Treatment  Code Status:    Code Status Orders  (From admission, onward)           Start     Ordered   10/08/21 0031  Full code  Continuous        10/08/21 0031           Code Status History     This patient has a current code status but no historical code status.       Prognosis:  Unable to determine  Discharge Planning: To Be Determined  Care plan was discussed with patient Thank you for allowing the Palliative Medicine Team to assist in the care of this patient.    Loistine Chance, MD  Please contact Palliative Medicine Team phone at [phone] for questions and concerns.

## 2021-10-19 NOTE — Progress Notes (Addendum)
HEMATOLOGY-ONCOLOGY PROGRESS NOTE  ASSESSMENT AND PLAN: 1.  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  Malignant ascites 3.  Protein calorie malnutrition 4.  ADHD 5.  Anemia of chronic disease (cancer)  -Again discussed Port-A-Cath placement with the patient and his sister who was at the bedside.  The patient was evaluated by IR on 12/30 and consent was signed at that time.  However, the patient does not recall meeting with IR.  I discussed with the IR PA today who will follow up with the patient. -Order for ultrasound-guided paracentesis has been placed. -Continue supplements.  Will need outpatient follow-up with our dietitian at the cancer center. -Recommended for the patient to work with physical therapy to evaluate if he should go to SNF for rehab versus home with home health physical therapy. -The patient's sister had questions regarding discharge planning.  TOC referral has been placed to discuss discharge planning.  SUBJECTIVE: Reports ongoing abdominal distention.  Has not been out of bed very much.  He is not having any nausea or vomiting this morning.  REVIEW OF SYSTEMS:   Review of Systems  Constitutional:  Positive for malaise/fatigue. Negative for chills and fever.  HENT: Negative.    Eyes: Negative.   Respiratory: Negative.    Cardiovascular: Negative.   Gastrointestinal:  Negative for nausea and vomiting.       Increased ascites  Musculoskeletal: Negative.   Skin: Negative.   Neurological: Negative.   Endo/Heme/Allergies: Negative.   Psychiatric/Behavioral: Negative.     I have reviewed the past medical history, past surgical history, social history and family history with the patient and they are unchanged from previous note.   PHYSICAL EXAMINATION: ECOG PERFORMANCE STATUS: 2 - Symptomatic, <50% confined to bed  Vitals:   10/18/21 2028 10/19/21 0457  BP: (!) 171/98 (!) 132/92  Pulse: 81 83  Resp: 18 16  Temp: 98.3 F  (36.8 C) 98.5 F (36.9 C)  SpO2: 98% 97%   Filed Weights   10/07/21 1710 10/07/21 2358 10/16/21 1255  Weight: 57.2 kg 53.6 kg 53.6 kg    Intake/Output from previous day: 01/02 0701 - 01/03 0700 In: 932 [P.O.:833] Out: 550 [Urine:550]  Physical Exam Constitutional:      General: He is not in acute distress. HENT:     Head: Normocephalic.  Cardiovascular:     Rate and Rhythm: Normal rate and regular rhythm.  Pulmonary:     Effort: Pulmonary effort is normal. No respiratory distress.  Abdominal:     General: There is distension.     Comments: Ascites present  Skin:    General: Skin is warm and dry.  Neurological:     Mental Status: He is alert and oriented to person, place, and time.  Psychiatric:        Mood and Affect: Mood normal.        Behavior: Behavior normal.    LABORATORY DATA:  I have reviewed the data as listed CMP Latest Ref Rng & Units 10/14/2021 10/13/2021 10/12/2021  Glucose 70 - 99 mg/dL 90 115(H) 89  BUN 8 - 23 mg/dL 16 16 14   Creatinine 0.61 - 1.24 mg/dL 0.58(L) 0.69 0.54(L)  Sodium 135 - 145 mmol/L 134(L) 130(L) 135  Potassium 3.5 - 5.1 mmol/L 4.3 4.6 4.5  Chloride 98 - 111 mmol/L 95(L) 97(L) 98  CO2 22 - 32 mmol/L 27 25 27   Calcium 8.9 - 10.3 mg/dL 8.7(L) 8.2(L) 8.6(L)  Total Protein 6.5 - 8.1 g/dL 6.8  6.3(L) 6.8  Total Bilirubin 0.3 - 1.2 mg/dL 1.1 1.0 1.1  Alkaline Phos 38 - 126 U/L 457(H) 457(H) 414(H)  AST 15 - 41 U/L 125(H) 323(H) 329(H)  ALT 0 - 44 U/L 46(H) 65(H) 61(H)    Lab Results  Component Value Date   WBC 11.8 (H) 10/19/2021   HGB 10.0 (L) 10/19/2021   HCT 32.5 (L) 10/19/2021   MCV 83.8 10/19/2021   PLT 448 (H) 10/19/2021   NEUTROABS 10.1 (H) 10/19/2021    Lab Results  Component Value Date   CEA1 2,798.0 (H) 10/08/2021    CT ABDOMEN PELVIS WO CONTRAST  Result Date: 10/07/2021 CLINICAL DATA:  Abdominal distension over the past 3 weeks, initial encounter EXAM: CT ABDOMEN AND PELVIS WITHOUT CONTRAST TECHNIQUE:  Multidetector CT imaging of the abdomen and pelvis was performed following the standard protocol without IV contrast. COMPARISON:  06/25/2013 FINDINGS: Lower chest: 4 mm nodule is noted in the right lower lobe on the first image incompletely evaluated on this exam. Hepatobiliary: Liver demonstrates multiple hypodensities scattered throughout with some areas of increased attenuation identified within most consistent with metastatic disease. Gallbladder is unremarkable. Mild perihepatic ascites is seen. Pancreas: Unremarkable. No pancreatic ductal dilatation or surrounding inflammatory changes. Spleen: Normal in size without focal abnormality. Adrenals/Urinary Tract: Adrenal glands are within normal limits. Kidneys demonstrate no renal calculi or obstructive changes. The bladder is partially distended. Stomach/Bowel: Scattered diverticular change of the colon is noted. No obstructive or inflammatory changes are seen. No discrete mass is noted the appendix is not discretely visualized although no inflammatory changes to suggest appendicitis are noted. The stomach and small bowel appear within normal limits. Vascular/Lymphatic: Diffuse atherosclerotic calcifications of the abdominal aorta are noted without aneurysmal dilatation. No discrete lymphadenopathy is noted. Reproductive: Prostate is unremarkable. Other: Significant ascites is noted within the abdomen and pelvis. Changes consistent with prior hernia repair are noted. Dominant right-sided hydrocele is noted. Changes of prior hernia repair are seen along the lower anterior abdominal wall. Musculoskeletal: Degenerative changes of lumbar spine are noted. Scoliosis concave to the left is seen. Old rib fractures on the right are noted posteriorly. 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 Definitive primary is not identified on this exam. Significant ascites consistent with the underlying metastatic disease. No obstructive changes are  noted. Mild diverticular change is seen. 4 mm nodule in the right lower lobe incompletely evaluated on this exam. Given the findings in the abdomen, CT of the chest is recommended in the short-term for further evaluation and staging. Electronically Signed   By: Inez Catalina M.D.   On: 10/07/2021 19:12   DG Chest 2 View  Result Date: 10/07/2021 CLINICAL DATA:  Worsening shortness of breath and peripheral edema. EXAM: CHEST - 2 VIEW COMPARISON:  Chest x-ray 03/29/2011. FINDINGS: The heart size and mediastinal contours are within normal limits. Both lungs are clear. The visualized skeletal structures are unremarkable. IMPRESSION: No active cardiopulmonary disease. Electronically Signed   By: Ronney Asters M.D.   On: 10/07/2021 18:59   CT HEAD WO CONTRAST (5MM)  Result Date: 10/08/2021 CLINICAL DATA:  Metastatic disease evaluation EXAM: CT HEAD WITHOUT CONTRAST TECHNIQUE: Contiguous axial images were obtained from the base of the skull through the vertex without intravenous contrast. COMPARISON:  None. FINDINGS: Brain: There is no acute intracranial hemorrhage, mass effect, or edema. Gray-white differentiation is preserved. There is no extra-axial fluid collection. Ventricles and sulci are within normal limits in size and configuration. Small chronic infarct right caudate  head. Minimal patchy hypoattenuation in the supratentorial white matter is nonspecific but may reflect minor chronic microvascular ischemic changes. Vascular: There is atherosclerotic calcification at the skull base. Skull: Calvarium is unremarkable. Sinuses/Orbits: No acute finding. Other: None. IMPRESSION: No acute intracranial abnormality. No evidence of metastatic disease on this noncontrast study. Electronically Signed   By: Macy Mis M.D.   On: 10/08/2021 15:11   CT CHEST WO CONTRAST  Result Date: 10/08/2021 CLINICAL DATA:  Occult malignancy EXAM: CT CHEST WITHOUT CONTRAST TECHNIQUE: Multidetector CT imaging of the chest was  performed following the standard protocol without IV contrast. COMPARISON:  Chest CT 05/28/2005 FINDINGS: Cardiovascular: Normal cardiac size. No pericardial disease. Coronary artery calcifications. Atherosclerotic calcifications of the aortic arch and descending aorta. The ascending aorta is nonaneurysmal. Mediastinum/Nodes: No mediastinal, hilar, or axillary lymphadenopathy. No mediastinal mass. Thyroid is unremarkable. The esophagus is unremarkable. The trachea is unremarkable. Lungs/Pleura: The central airways are patent. There is mild centrilobular emphysema. There is basilar subsegmental atelectasis. No focal airspace consolidation. There are multiple solid bilateral pulmonary nodules, largest measuring 5 mm in the left upper lobe (series 5, image 82). There is a 4 mm subpleural nodule in the right middle lobe (series 5, image 94). Additional scattered 2-3 mm nodules bilaterally. Upper Abdomen: Enlarged liver with multiple hypodensities scattered throughout as seen on yesterday CT concerning for metastatic disease. Abdominal ascites noted, also seen on recent abdominal CT. Musculoskeletal: Multilevel degenerative changes of the spine. There is sclerotic endplate change noted along the lower thoracic spine. There is no suspicious osseous lesion. Chronic right lower rib injuries. IMPRESSION: Multiple solid bilateral pulmonary nodules, largest measuring 5 mm, which could represent metastatic disease. No suspicious mass to suggest a primary pulmonary malignancy. Mild centrilobular emphysema.  Bibasilar subsegmental atelectasis. Diffuse hypodense liver lesions and abdominal ascites as seen on recent CT of the abdomen and pelvis, concerning for metastatic disease. Electronically Signed   By: Maurine Simmering M.D.   On: 10/08/2021 15:20   MR LIVER W WO CONTRAST  Result Date: 10/09/2021 CLINICAL DATA:  Evaluation of hepatic lesions, hepatocellular carcinoma. EXAM: MRI ABDOMEN WITHOUT AND WITH CONTRAST TECHNIQUE:  Multiplanar multisequence MR imaging of the abdomen was performed both before and after the administration of intravenous contrast. CONTRAST:  70mL GADAVIST GADOBUTROL 1 MMOL/ML IV SOLN COMPARISON:  CT abdomen pelvis October 07, 2021 and chest CT October 08, 2021. FINDINGS: Despite efforts by the technologist and patient, motion artifact is present on today's exam and could not be eliminated. This reduces exam sensitivity and specificity. Lower chest: No acute abnormality. Hepatobiliary: Liver is enlarged with innumerable bilobar hepatic lesions, predominantly these demonstrate mildly hyperintense T2 signal with a peripheral rim of enhancement. For reference. There is a lesion in the posteromedial aspect of the right lobe of the liver measuring 3.8 x 3.1 cm on image 36/17 and a lesion in the posteromedial aspect of the left lobe of the liver which measures 15 x 13 mm on image 38/32. Pericholecystic fluid without gallbladder wall thickening or cholelithiasis is favored sequela of underlying hepatocellular disease. No biliary ductal dilation. Pancreas: Pancreas appears atrophic and poorly evaluated without pancreatic ductal dilation. Spleen:  Within normal limits in size and appearance. Adrenals/Urinary Tract: The bilateral adrenal glands appear unremarkable. No hydronephrosis. No solid enhancing renal mass. Stomach/Bowel: No acute abnormality on this limited evaluation. Vascular/Lymphatic: The portal, splenic and superior mesenteric veins appear patent. Abdominal adenopathy for instance a periportal lymph node measuring 16 mm in short axis on image 66/9. Other:  Large volume abdominal ascites. Musculoskeletal: No suspicious bone lesions identified. IMPRESSION: Despite efforts by the technologist and patient, motion artifact is present on today's exam and could not be eliminated. This reduces exam sensitivity and specificity. 1. Innumerable bilobar hepatic lesions, with imaging features most consistent with  metastatic disease. Consider further evaluation with direct tissue sampling for more definitive characterization. 2. Abdominal adenopathy and large volume ascites, likely reflecting disease involvement. Electronically Signed   By: Dahlia Bailiff M.D.   On: 10/09/2021 19:35   US BIOPSY (LIVER)  Result Date: 10/12/2021 INDICATION: 65 year old with innumerable liver lesions. Findings are concerning for metastatic disease and tissue diagnosis is needed. Patient also has a large volume of ascites. EXAM: 1. Ultrasound-guided paracentesis 2. Ultrasound-guided liver lesion biopsy MEDICATIONS: Moderate sedation ANESTHESIA/SEDATION: Moderate (conscious) sedation was employed during this procedure. A total of Versed 2.0mg  and fentanyl 100 mcg was administered intravenously at the order of the provider performing the procedure. Total intra-service moderate sedation time: 26 minutes. Patient's level of consciousness and vital signs were monitored continuously by radiology nurse throughout the procedure under the supervision of the provider performing the procedure. FLUOROSCOPY TIME:  None COMPLICATIONS: None immediate. PROCEDURE: Informed written consent was obtained from the patient after a thorough discussion of the procedural risks, benefits and alternatives. All questions were addressed. A timeout was performed prior to the initiation of the procedure. The abdomen was evaluated with ultrasound. Ascites was identified around the liver. Subtle lesions throughout the liver. Hyperechoic lesion in left hepatic lobe was targeted for biopsy. The anterior and right side of the abdomen was prepped with chlorhexidine and sterile field was created. Maximal barrier sterile technique was utilized including caps, mask, sterile gowns, sterile gloves, sterile drape, hand hygiene and skin antiseptic. Right lower abdomen was anesthetized with 1% lidocaine. A small incision was made. Using ultrasound guidance, a Safe-T-Centesis catheter  was directed into perihepatic ascites. Paracentesis was performed. During the paracentesis, the left hepatic lobe was targeted for biopsy. The left upper abdomen was anesthetized with 1% lidocaine and a small incision was made. Using ultrasound guidance, 17 gauge coaxial needle was directed into the left hepatic lobe and directed into a hyperechoic lesion. Total of 4 core biopsies were obtained with an 18 gauge core device. Specimens placed in formalin. Gel-Foam slurry was injected through the 17 gauge needle as it was removed. Bandage placed at the biopsy site. Paracentesis catheter was removed. Bandage placed at the paracentesis site. FINDINGS: Liver is diffusely heterogeneous with poorly defined lesions. A hyperechoic lesion in left hepatic lobe was successfully biopsied. 1.9 L of amber colored ascites was removed. IMPRESSION: 1. Ultrasound-guided core biopsy of a left hepatic lesion. 2. Ultrasound-guided paracentesis.  1.9 L of fluid was removed. Electronically Signed   By: Markus Daft M.D.   On: 10/12/2021 16:00   US Paracentesis  Result Date: 10/12/2021 INDICATION: 66 year old with innumerable liver lesions. Findings are concerning for metastatic disease and tissue diagnosis is needed. Patient also has a large volume of ascites. EXAM: 1. Ultrasound-guided paracentesis 2. Ultrasound-guided liver lesion biopsy MEDICATIONS: Moderate sedation ANESTHESIA/SEDATION: Moderate (conscious) sedation was employed during this procedure. A total of Versed 2.0mg  and fentanyl 100 mcg was administered intravenously at the order of the provider performing the procedure. Total intra-service moderate sedation time: 26 minutes. Patient's level of consciousness and vital signs were monitored continuously by radiology nurse throughout the procedure under the supervision of the provider performing the procedure. FLUOROSCOPY TIME:  None COMPLICATIONS: None immediate. PROCEDURE: Informed written consent was  obtained from the  patient after a thorough discussion of the procedural risks, benefits and alternatives. All questions were addressed. A timeout was performed prior to the initiation of the procedure. The abdomen was evaluated with ultrasound. Ascites was identified around the liver. Subtle lesions throughout the liver. Hyperechoic lesion in left hepatic lobe was targeted for biopsy. The anterior and right side of the abdomen was prepped with chlorhexidine and sterile field was created. Maximal barrier sterile technique was utilized including caps, mask, sterile gowns, sterile gloves, sterile drape, hand hygiene and skin antiseptic. Right lower abdomen was anesthetized with 1% lidocaine. A small incision was made. Using ultrasound guidance, a Safe-T-Centesis catheter was directed into perihepatic ascites. Paracentesis was performed. During the paracentesis, the left hepatic lobe was targeted for biopsy. The left upper abdomen was anesthetized with 1% lidocaine and a small incision was made. Using ultrasound guidance, 17 gauge coaxial needle was directed into the left hepatic lobe and directed into a hyperechoic lesion. Total of 4 core biopsies were obtained with an 18 gauge core device. Specimens placed in formalin. Gel-Foam slurry was injected through the 17 gauge needle as it was removed. Bandage placed at the biopsy site. Paracentesis catheter was removed. Bandage placed at the paracentesis site. FINDINGS: Liver is diffusely heterogeneous with poorly defined lesions. A hyperechoic lesion in left hepatic lobe was successfully biopsied. 1.9 L of amber colored ascites was removed. IMPRESSION: 1. Ultrasound-guided core biopsy of a left hepatic lesion. 2. Ultrasound-guided paracentesis.  1.9 L of fluid was removed. Electronically Signed   By: Markus Daft M.D.   On: 10/12/2021 16:00   US Paracentesis  Result Date: 10/08/2021 INDICATION: Patient with history of asthma, ADHD, COPD, weight loss, recent imaging revealing diffuse  hepatic metastatic disease, ascites, right lower lobe pulmonary nodule. Request received for diagnostic and therapeutic paracentesis. EXAM: ULTRASOUND GUIDED DIAGNOSTIC AND THERAPEUTIC PARACENTESIS MEDICATIONS: 10 mL 1% lidocaine COMPLICATIONS: None immediate. PROCEDURE: Informed written consent was obtained from the patient after a discussion of the risks, benefits and alternatives to treatment. A timeout was performed prior to the initiation of the procedure. Initial ultrasound scanning demonstrates a small amount of ascites within the right lower abdominal quadrant. The right lower abdomen was prepped and draped in the usual sterile fashion. 1% lidocaine was used for local anesthesia. Following this, a 19 gauge, 7-cm, Yueh catheter was introduced. An ultrasound image was saved for documentation purposes. The paracentesis was performed. The catheter was removed and a dressing was applied. The patient tolerated the procedure well without immediate post procedural complication. FINDINGS: A total of approximately 1.6 liters of clear, yellow fluid was removed. Samples were sent to the laboratory as requested by the clinical team. IMPRESSION: Successful ultrasound-guided diagnostic and therapeutic paracentesis yielding 1.6 liters of peritoneal fluid. Read by: Rowe Robert, PA-C Electronically Signed   By: Michaelle Birks M.D.   On: 10/08/2021 17:03   DG Chest Port 1 View  Result Date: 10/11/2021 CLINICAL DATA:  66 year old male with arrhythmia, leukocytosis. EXAM: PORTABLE CHEST 1 VIEW COMPARISON:  Chest CT 10/08/2021 and earlier. FINDINGS: Portable AP semi upright view at 0709 hours. Mildly lower lung volumes with evidence of chronic pulmonary hyperinflation on the recent CT demonstrating emphysema. Mediastinal contours remain within normal limits. Calcified aortic atherosclerosis. Visualized tracheal air column is within normal limits. Allowing for portable technique the lungs are clear. No pneumothorax or pleural  effusion. Mild chronic lung base scarring. Paucity of bowel gas in the upper abdomen. No acute osseous abnormality identified. IMPRESSION:  Emphysema (ICD10-J43.9). No acute cardiopulmonary abnormality. Electronically Signed   By: Genevie Ann M.D.   On: 10/11/2021 07:34   ECHOCARDIOGRAM COMPLETE  Result Date: 10/08/2021    ECHOCARDIOGRAM REPORT   Patient Name:   Jason Mcguire Date of Exam: 10/08/2021 Medical Rec #:  [id number]         Height:       66.0 in Accession #:    [id number]        Weight:       118.2 lb Date of Birth:  April 29, 1956         BSA:          1.599 m Patient Age:    66 years          BP:           156/72 mmHg Patient Gender: M                 HR:           73 bpm. Exam Location:  Inpatient Procedure: 2D Echo, 3D Echo, Cardiac Doppler, Color Doppler and Strain Analysis Indications:    Elevated Troponin  History:        Patient has no prior history of Echocardiogram examinations.                 Signs/Symptoms:Shortness of Breath. Bilateral lower extremity                 edema.  Sonographer:    Darlina Sicilian RDCS Referring Phys: [place]  1. Left ventricular ejection fraction, by estimation, is 60 to 65%. Left ventricular ejection fraction by 3D volume is 62 %. The left ventricle has normal function. The left ventricle has no regional wall motion abnormalities. There is mild concentric left ventricular hypertrophy. Left ventricular diastolic parameters are consistent with Grade I diastolic dysfunction (impaired relaxation).  2. Right ventricular systolic function is normal. The right ventricular size is normal.  3. The mitral valve is normal in structure. No evidence of mitral valve regurgitation. No evidence of mitral stenosis. Moderate mitral annular calcification.  4. The aortic valve is tricuspid. There is mild calcification of the aortic valve. There is mild thickening of the aortic valve. Aortic valve regurgitation is trivial. Aortic valve sclerosis is present,  with no evidence of aortic valve stenosis.  5. The inferior vena cava is normal in size with greater than 50% respiratory variability, suggesting right atrial pressure of 3 mmHg. FINDINGS  Left Ventricle: Left ventricular ejection fraction, by estimation, is 60 to 65%. Left ventricular ejection fraction by 3D volume is 62 %. The left ventricle has normal function. The left ventricle has no regional wall motion abnormalities. Global longitudinal strain performed but not reported based on interpreter judgement due to suboptimal tracking. The left ventricular internal cavity size was normal in size. There is mild concentric left ventricular hypertrophy. Left ventricular diastolic parameters are consistent with Grade I diastolic dysfunction (impaired relaxation). Normal left ventricular filling pressure. Right Ventricle: The right ventricular size is normal. No increase in right ventricular wall thickness. Right ventricular systolic function is normal. Left Atrium: Left atrial size was normal in size. Right Atrium: Right atrial size was normal in size. Pericardium: There is no evidence of pericardial effusion. Mitral Valve: The mitral valve is normal in structure. Moderate mitral annular calcification. No evidence of mitral valve regurgitation. No evidence of mitral valve stenosis. Tricuspid Valve: The tricuspid valve is normal in structure. Tricuspid valve regurgitation  is not demonstrated. No evidence of tricuspid stenosis. Aortic Valve: The aortic valve is tricuspid. There is mild calcification of the aortic valve. There is mild thickening of the aortic valve. Aortic valve regurgitation is trivial. Aortic regurgitation PHT measures 411 msec. Aortic valve sclerosis is present, with no evidence of aortic valve stenosis. Pulmonic Valve: The pulmonic valve was normal in structure. Pulmonic valve regurgitation is not visualized. No evidence of pulmonic stenosis. Aorta: The aortic root is normal in size and structure.  Venous: The inferior vena cava is normal in size with greater than 50% respiratory variability, suggesting right atrial pressure of 3 mmHg. IAS/Shunts: No atrial level shunt detected by color flow Doppler.  LEFT VENTRICLE PLAX 2D LVIDd:         3.80 cm         Diastology LVIDs:         2.30 cm         LV e' medial:    5.33 cm/s LV PW:         1.20 cm         LV E/e' medial:  8.7 LV IVS:        1.20 cm         LV e' lateral:   7.51 cm/s LVOT diam:     1.90 cm         LV E/e' lateral: 6.2 LV SV:         51 LV SV Index:   32 LVOT Area:     2.84 cm        3D Volume EF                                LV 3D EF:    Left                                             ventricul                                             ar                                             ejection                                             fraction                                             by 3D                                             volume is  62 %.                                 3D Volume EF:                                3D EF:        62 %                                LV EDV:       111 ml                                LV ESV:       42 ml                                LV SV:        69 ml RIGHT VENTRICLE RV S prime:     14.50 cm/s TAPSE (M-mode): 2.2 cm LEFT ATRIUM             Index        RIGHT ATRIUM          Index LA diam:        3.30 cm 2.06 cm/m   RA Area:     8.60 cm LA Vol (A2C):   59.0 ml 36.87 ml/m  RA Volume:   13.50 ml 8.44 ml/m LA Vol (A4C):   39.6 ml 24.76 ml/m LA Biplane Vol: 46.9 ml 29.33 ml/m  AORTIC VALVE LVOT Vmax:   85.70 cm/s LVOT Vmean:  46.500 cm/s LVOT VTI:    0.181 m AI PHT:      411 msec  AORTA Ao Root diam: 3.30 cm Ao Asc diam:  3.00 cm MITRAL VALVE MV Area (PHT): 1.85 cm    SHUNTS MV Decel Time: 409 msec    Systemic VTI:  0.18 m MV E velocity: 46.60 cm/s  Systemic Diam: 1.90 cm MV A velocity: 91.70 cm/s MV E/A ratio:  0.51 Mihai Croitoru MD Electronically  signed by Sanda Klein MD Signature Date/Time: 10/08/2021/3:24:42 PM    Final      No future appointments.    LOS: 9 days   Mikey Bussing, DNP, AGPCNP-BC, AOCNP 10/19/21   Addendum  I have seen the patient, examined him. I agree with the assessment and and plan and have edited the notes.   Pt underwent colonoscopy by Dr. Watt Climes on 12/31 which showed a mass in distal rectum, likely the primary. His ascites is increasing, will repeat paracentesis. I spoke with palliative care team, Dr. Rowe Pavy is titrating his pain medication, pending PT evaluation to see if patient can be safely discharged home.  The patient, he has been able to ambulate in the hallway without difficulty.  Hopefully I will put a port in tomorrow.  Started per primary team, I plan to see him back after discharge to start chemotherapy.  Truitt Merle  10/19/2021

## 2021-10-20 ENCOUNTER — Other Ambulatory Visit: Payer: Self-pay

## 2021-10-20 ENCOUNTER — Inpatient Hospital Stay (HOSPITAL_COMMUNITY): Payer: PPO

## 2021-10-20 HISTORY — PX: IR IMAGING GUIDED PORT INSERTION: IMG5740

## 2021-10-20 LAB — CBC WITH DIFFERENTIAL/PLATELET
Abs Immature Granulocytes: 0.08 10*3/uL — ABNORMAL HIGH (ref 0.00–0.07)
Basophils Absolute: 0.1 10*3/uL (ref 0.0–0.1)
Basophils Relative: 0 %
Eosinophils Absolute: 0.1 10*3/uL (ref 0.0–0.5)
Eosinophils Relative: 1 %
HCT: 34.3 % — ABNORMAL LOW (ref 39.0–52.0)
Hemoglobin: 10.1 g/dL — ABNORMAL LOW (ref 13.0–17.0)
Immature Granulocytes: 1 %
Lymphocytes Relative: 8 %
Lymphs Abs: 0.9 10*3/uL (ref 0.7–4.0)
MCH: 25.5 pg — ABNORMAL LOW (ref 26.0–34.0)
MCHC: 29.4 g/dL — ABNORMAL LOW (ref 30.0–36.0)
MCV: 86.6 fL (ref 80.0–100.0)
Monocytes Absolute: 1.1 10*3/uL — ABNORMAL HIGH (ref 0.1–1.0)
Monocytes Relative: 9 %
Neutro Abs: 9.5 10*3/uL — ABNORMAL HIGH (ref 1.7–7.7)
Neutrophils Relative %: 81 %
Platelets: 395 10*3/uL (ref 150–400)
RBC: 3.96 MIL/uL — ABNORMAL LOW (ref 4.22–5.81)
RDW: 19.9 % — ABNORMAL HIGH (ref 11.5–15.5)
WBC: 11.6 10*3/uL — ABNORMAL HIGH (ref 4.0–10.5)
nRBC: 0 % (ref 0.0–0.2)

## 2021-10-20 LAB — SURGICAL PATHOLOGY

## 2021-10-20 MED ORDER — MIDAZOLAM HCL 2 MG/2ML IJ SOLN
INTRAMUSCULAR | Status: AC
  Filled 2021-10-20: qty 4

## 2021-10-20 MED ORDER — MIDAZOLAM HCL 2 MG/2ML IJ SOLN
INTRAMUSCULAR | Status: AC | PRN
Start: 2021-10-20 — End: 2021-10-20
  Administered 2021-10-20: 1 mg via INTRAVENOUS

## 2021-10-20 MED ORDER — HEPARIN SOD (PORK) LOCK FLUSH 100 UNIT/ML IV SOLN
INTRAVENOUS | Status: AC
  Filled 2021-10-20: qty 5

## 2021-10-20 MED ORDER — FENTANYL CITRATE (PF) 100 MCG/2ML IJ SOLN
INTRAMUSCULAR | Status: AC
  Filled 2021-10-20: qty 2

## 2021-10-20 MED ORDER — LIDOCAINE-EPINEPHRINE (PF) 2 %-1:200000 IJ SOLN
INTRAMUSCULAR | Status: AC
  Filled 2021-10-20: qty 20

## 2021-10-20 MED ORDER — LIDOCAINE-EPINEPHRINE 2 %-1:100000 IJ SOLN
INTRAMUSCULAR | Status: AC | PRN
  Administered 2021-10-20: 20 mL

## 2021-10-20 MED ORDER — AMPHETAMINE-DEXTROAMPHETAMINE 10 MG PO TABS
20.0000 mg | ORAL_TABLET | Freq: Two times a day (BID) | ORAL | Status: DC
  Administered 2021-10-20 – 2021-10-23 (×7): 20 mg via ORAL
  Filled 2021-10-20 (×7): qty 2

## 2021-10-20 MED ORDER — AMPHETAMINE-DEXTROAMPHETAMINE 10 MG PO TABS: 10.0000 mg | ORAL_TABLET | Freq: Two times a day (BID) | ORAL | Status: DC

## 2021-10-20 MED ORDER — FENTANYL CITRATE (PF) 100 MCG/2ML IJ SOLN
INTRAMUSCULAR | Status: AC | PRN
  Administered 2021-10-20: 50 ug via INTRAVENOUS
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
## 2021-10-20 NOTE — Progress Notes (Addendum)
Daily Progress Note   Patient Name: Jason Mcguire       Date: 10/20/2021 DOB: 02/29/1956  Age: 66 y.o. MRN#: [id number] Attending Physician: Nita Sells, MD Primary Care Physician: Kathyrn Lass, MD Admit Date: 10/07/2021  Reason for Consultation/Follow-up: Establishing goals of care and Pain control  Subjective:  Jason Mcguire is resting in bed, appears comfortable, does not arouse to gentle stimulation currently asleep, discussed with his bedside RN, call placed and discussed about pain management with both sisters Jeani Hawking as well as Santiago Glad, see below.      Length of Stay: 10  Current Medications: Scheduled Meds:   amphetamine-dextroamphetamine  20 mg Oral BID WC   feeding supplement  237 mL Oral BID BM   oxyCODONE  60 mg Oral Q12H   sodium chloride flush  3 mL Intravenous Q12H   thiamine  100 mg Oral Daily    Continuous Infusions:  sodium chloride 250 mL (10/08/21 1112)    PRN Meds: sodium chloride, acetaminophen **OR** acetaminophen, albuterol, ALPRAZolam, alum & mag hydroxide-simeth, hydrALAZINE, HYDROmorphone (DILAUDID) injection, oxyCODONE, sodium chloride flush  Physical Exam          resting comfortably, no distress Regular work of breathing No edema  Abdomen less distended   Vital Signs: BP (!) 147/73 (BP Location: Right Arm)    Pulse 88    Temp 98.2 F (36.8 C) (Oral)    Resp 16    Ht 5\' 6"  (1.676 m)    Wt 53.6 kg    SpO2 98%    BMI 19.07 kg/m  SpO2: SpO2: 98 % O2 Device: O2 Device: Room Air O2 Flow Rate: O2 Flow Rate (L/min): 2 L/min  Intake/output summary:  Intake/Output Summary (Last 24 hours) at 10/20/2021 1039 Last data filed at 10/20/2021 0500 Gross per 24 hour  Intake 240 ml  Output 450 ml  Net -210 ml    LBM: Last BM Date: 10/18/21 Baseline  Weight: Weight: 57.2 kg Most recent weight: Weight: 53.6 kg       Palliative Assessment/Data:    Flowsheet Rows    Flowsheet Row Most Recent Value  Intake Tab   Referral Department Hospitalist  Unit at Time of Referral Oncology Unit  Palliative Care Primary Diagnosis Cancer  Date Notified 10/12/21  Palliative Care Type New Palliative care  Reason for  referral Clarify Goals of Care, Pain  Date of Admission 10/07/21  Date first seen by Palliative Care 10/13/21  # of days Palliative referral response time 1 Day(s)  # of days IP prior to Palliative referral 5  Clinical Assessment   Palliative Performance Scale Score 40%  Psychosocial & Spiritual Assessment   Palliative Care Outcomes   Patient/Family meeting held? Yes  Who was at the meeting? 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 [facility]) 10/10/2021   Attention deficit disorder with hyperactivity    Anxiety disorder    Ascites, malignant    Weight loss    Anemia    Dehydration    Elevated CK    Elevated lactic acid level    Elevated troponin    Rectal cancer metastasized to liver ([place]) 10/07/2021   COPD (chronic obstructive pulmonary disease) ([place]) 05/06/2011   Smoker 05/06/2011   Chronic rhinitis 05/06/2011    Palliative Care Assessment & Plan   Patient Profile: Jason Mcguire is a 66 year old male with past medical history of asthma, ADHD and anxiety who presented with shortness of breath and worsened abdominal distention for about 3 weeks.  He was found out elevated LFTs, ascites with CT abdomen pelvis and liver lesions concerning for metastatic cancer.  There are also lesions noted in lungs on imaging.  He underwent paracentesis as well as liver biopsy.  Recommendations/Plan: Pain, cancer related:   Continue OxyContin to 60mg  twice  daily.   Oxycodone 10mg  every 3 hours as first line pain medication.    Dilaudid 1mg  IV as second line pain medication to be given 45-60 minutes after oral medication if oral medication is insufficient to relieve pain.  Likely home in the next couple of days.  Pain medication options discussed with both sisters on the phone today, likely for port a cath placement today. Recommend outpatient palliative care and oncology follow up on discharge.   Goals of Care and Additional Recommendations: Limitations on Scope of Treatment: Full Scope Treatment  Code Status:    Code Status Orders  (From admission, onward)           Start     Ordered   10/08/21 0031  Full code  Continuous        10/08/21 0031           Code Status History     This patient has a current code status but no historical code status.       Prognosis:  Unable to determine  Discharge Planning: Recommend outpatient palliative.   Care plan was discussed with IDT.  Thank you for allowing the Palliative Medicine Team to assist in the care of this patient. 25 minutes spent.   Loistine Chance, MD  Please contact Palliative Medicine Team phone at [phone] for questions and concerns.

## 2021-10-20 NOTE — Procedures (Signed)
°  Procedure: R IJ port catheter placement   EBL:   minimal Complications:  none immediate  See full dictation in [facility].  Dillard Cannon MD Main # [phone] Pager  [phone] Mobile [phone]

## 2021-10-20 NOTE — Progress Notes (Signed)
Physical Therapy Treatment Patient Details Name: Jason Mcguire MRN: [id number] DOB: 07-24-1956 Today's Date: 10/20/2021   History of Present Illness Patient is a 66 year old male who presented to the hospital with shortness of breath and BLE edema. patient was found to have malignant neoplasm metastatic to liver, SOB, adbominal ascites, and dehydration.patient underwent paracentesis on 12/23 removing 1.6L of fluid. paracentesis and biopsy of liver on 12/27 with 1.9 L removed.  PMH: alcohol use, ADD, asthma, and COPD.    PT Comments    Patient agreeable to mobilize this date and ambulated increased distance of ~400' with and without AD. Pt noted to be unsteady, drifting Rt/Lt with LOB requiring assist to prevent fall. Pt reports having no person to assist at home with ADL's as balance impairments pose a fall risk for bathing, dressing, and meal preparation. Discussed option of ST rehab at SNF setting as pt is motivated to work on impairments and regain balance/strength to improve safety at home. Pt plans to discuss options with family regarding assistance available and options for rehab with case manager. Acute PT will continue to progress pt as able throughout stay.    Recommendations for follow up therapy are one component of a multi-disciplinary discharge planning process, led by the attending physician.  Recommendations may be updated based on patient status, additional functional criteria and insurance authorization.  Follow Up Recommendations  Skilled nursing-short term rehab (<3 hours/day) - will need HHPT if pt decline SNF for ST rehab     Assistance Recommended at Discharge Intermittent Supervision/Assistance  Patient can return home with the following A little help with walking and/or transfers;A little help with bathing/dressing/bathroom;Assistance with cooking/housework;Assist for transportation;Help with stairs or ramp for entrance   Equipment Recommendations  Other  (comment);None recommended by PT (TBA)    Recommendations for Other Services       Precautions / Restrictions Precautions Precautions: Fall Precaution Comments: monitor vitals Restrictions Weight Bearing Restrictions: No     Mobility  Bed Mobility Overal bed mobility: Modified Independent             General bed mobility comments: used rail, HOB elevated    Transfers Overall transfer level: Needs assistance Equipment used: None Transfers: Sit to/from Stand Sit to Stand: Min guard           General transfer comment: pt using bil UE's for power up to rise from EOB, guarding for safety.    Ambulation/Gait Ambulation/Gait assistance: Min assist Gait Distance (Feet): 400 Feet Assistive device: Rolling walker (2 wheels);None Gait Pattern/deviations: Step-through pattern;Decreased stride length;Drifts right/left;Narrow base of support Gait velocity: decr     General Gait Details: pt unsteady with LOB to Rt requiring Min assist to prevent fall. Pt stagerring throughout gait and min guard/assist required for safety. Pt's overall balance no different with or without RW for support.   Stairs             Wheelchair Mobility    Modified Rankin (Stroke Patients Only)       Balance                                            Cognition Arousal/Alertness: Awake/alert Behavior During Therapy: WFL for tasks assessed/performed Overall Cognitive Status: Within Functional Limits for tasks assessed Area of Impairment: Safety/judgement  Safety/Judgement: Decreased awareness of deficits     General Comments: pt has overall imparied perception of functional status and safety awreness. 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 PT Goal Formulation: With patient Time For Goal Achievement: 10/28/21 Potential to Achieve Goals: Good Progress towards PT goals: Progressing toward goals    Frequency    Min 3X/week      PT Plan Current plan remains appropriate    Co-evaluation              AM-PAC PT "6 Clicks" Mobility   Outcome Measure  Help needed turning from your back to your side while in a flat bed without using bedrails?: None Help needed moving from lying on your back to sitting on the side of a flat bed without using bedrails?: A Little Help needed moving to and from a bed to a chair (including a wheelchair)?: A Little Help needed standing up from a chair using your arms (e.g., wheelchair or bedside chair)?: A Little Help needed to walk in hospital room?: A Little Help needed climbing 3-5 steps with a railing? : A Lot 6 Click Score: 18    End of Session Equipment Utilized During Treatment: Gait belt Activity Tolerance: Patient tolerated treatment well Patient left: in chair;with call bell/phone within reach;with family/visitor present Nurse Communication: Mobility status PT Visit Diagnosis: Difficulty in walking, not elsewhere classified (R26.2)     Time: 1458-1600 PT Time Calculation (min) (ACUTE ONLY): 62 min  Charges:  $Gait Training: 8-22 mins $Therapeutic Activity: 23-37 mins $Self Care/Home Management: 8-22                     Jason Mcguire, DPT Acute Rehabilitation Services Office [phone] Pager [phone]    Jason Mcguire 10/20/2021, 4:17 PM

## 2021-10-20 NOTE — Progress Notes (Signed)
PROGRESS NOTE    Jason Mcguire  UJW:[id number] DOB: [birth year] DOA: 10/07/2021 PCP: Kathyrn Lass, MD   Chief Complaint  Patient presents with   Abdominal Pain   Shortness of Breath    66 year old white male known history ADHD,  presented 12/23/2022with SOB, worsening abdominal distension ongoing for about 3 weeks  PTA.Hx of heavy alcohol abuse when he was younger, currently drinks socially. In the ED, noted to have elevated LFTs, ascites with CT abd/pelvis showing liver lesions concerning for metastatic cancer.   Patient was admitted underwent further work-up.  He had paracentesis on 12/23 that showed no signs of infection. 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 Oncology following Port-A-Cath placed 10/20/2021 Continue Oxy IR 10 mg every 3 as needed Continue OxyContin 60 every 12  Not relieved by paincan use Dilaudid 1 mg every 3 as needed  Pain management Cancer related pain: Managed by palliative care, continue to adjust the pain regimen.Watch for sedation.  Tolerating well and pain is controlled.  Centrilobular emphysema noted in the CT chest.  Leukocytosis likely from metastatic disease.  No obvious infection present. Recent Labs  Lab 10/16/21 0648 10/17/21 0614 10/18/21 0604 10/19/21 0452 10/20/21 0515   WBC 9.9 12.8* 13.1* 11.8* 11.6*     Iron-deficiency anemia likely from malignancy monitor hb.  Iron supplementation upon discharge.  Monitor hemoglobin here  Rectal bleeding episodes from rectal cancer.  Stable hemoglobin Recent Labs  Lab 10/16/21 0648 10/17/21 0614 10/18/21 0604 10/19/21 0452 10/20/21 0515  HGB 10.2* 9.6* 10.0* 10.0* 10.1*  HCT 33.4* 32.3* 32.9* 32.5* 34.3*     Elevated TSH with normal free T4 outpatient follow-up in 3 weeks  Hyponatremia -stable.  Anxiety disorder ADHD: Continue on Xanax  and home adderal.  Goals of care: Palliative care following closely, appreciate input from pain management.   Overall prognosis is poor patient and family understands they like to pursue further treatment of this incurable disease but treatable as per oncology.  Severe malnutrition Nutrition Problem: Severe Malnutrition Etiology: chronic illness Signs/Symptoms: severe fat depletion, severe muscle depletion, energy intake < or equal to 75% for > or equal to 1 month Interventions: Ensure Enlive (each supplement provides 350kcal and 20 grams of protein)   Significant weight loss 61.2 in Sept to  > 53.6  kg-suspected due to malignancy, severe protein malnutrition.  Supplement diet RD eval    DVT prophylaxis: SCDs Start: 10/08/21 0031.  Lovenox held 2/2 biopsy and rectal bleed Code Status:   Code Status: Full Code Family Communication:  Discussed with sister at bedside  Status is: Inpatient Remains inpatient appropriate because: For ongoing management of abdominal pain ascites and biopsy result Disposition: Currently not medically stable for discharge. Per her sister [place]-patient does not have running water or heat at home, would like to talk with social worker, potentially could go to another sister/s  house locally versus return to home.  Hopefully discharge after Port-A-Cath placement if okay with oncology  Objective: Vitals last 24 hrs: Vitals:    10/20/21 1205 10/20/21 1210 10/20/21 1216 10/20/21 1220  BP: 139/71 125/71 (!) 148/76 125/76  Pulse: 80 83 85   Resp: 14 (!) 9 (!) 9 19  Temp:      TempSrc:      SpO2: 97% 99% 99%   Weight:      Height:       Weight change:   Intake/Output Summary (Last 24 hours) at 10/20/2021 1628 Last data filed at 10/20/2021 0500 Gross per 24 hour  Intake 240 ml  Output 450 ml  Net -210 ml    Net IO Since Admission: -439.09 mL [10/20/21 1628]   Physical Examination: Awake frail no distress CTA B no added sound no rales rhonchi S1-S2 no murmur no rub no gallop ROM intact moving all 4 limbs equally Abdomen slight distention no rebound no guarding No lower extremity edema Neurologically intact moving 4 limbs equally  Medications reviewed:  Scheduled Meds:  amphetamine-dextroamphetamine  10 mg Oral BID WC   feeding supplement  237 mL Oral BID BM   fentaNYL       lidocaine-EPINEPHrine       midazolam       oxyCODONE  60 mg Oral Q12H   sodium chloride flush  3 mL Intravenous Q12H   thiamine  100 mg Oral Daily  Continuous Infusions:  sodium chloride 250 mL (10/08/21 1112)   Diet Order             Diet regular Room service appropriate? Yes; Fluid consistency: Thin  Diet effective now                  Nutrition Problem: Severe Malnutrition Etiology: chronic illness Signs/Symptoms: severe fat depletion, severe muscle depletion, energy intake < or equal to 75% for > or equal to 1 month Interventions: Ensure Enlive (each supplement provides 350kcal and 20 grams of protein) Weight change:   Wt Readings from Last 3 Encounters:  10/16/21 53.6 kg  01/25/19 59 kg  06/20/13 61.2 kg  Consultants:see note  Procedures:see note Antimicrobials: Anti-infectives (From admission, onward)    None      Culture/Microbiology    Component Value Date/Time   SDES  10/12/2021 1511    PERITONEAL Performed at [facility], [place] [address], [place], [place] [address]     SPECREQUEST  10/12/2021 1511    NONE Performed at [facility], [place] [address] [place], [place] [address]    CULT  10/12/2021 1511    NO GROWTH 3 DAYS Performed at [facility] [address], [place],  [address]    REPTSTATUS 10/15/2021 FINAL 10/12/2021 1511    Other culture-see note  Unresulted Labs (From admission, onward)     Start     Ordered   10/10/21 0500  CBC with Differential/Platelet  Daily,   R     Question:  Specimen collection method  Answer:  Lab=Lab collect   10/09/21 1744   Unscheduled  Occult blood card to lab, stool  As needed,   R      10/08/21 0249          Data Reviewed: I have personally reviewed following labs and imaging studies CBC: Recent Labs  Lab 10/16/21 0648 10/17/21 0614 10/18/21 0604 10/19/21 0452 10/20/21 0515  WBC 9.9 12.8* 13.1* 11.8* 11.6*  NEUTROABS 7.8* 10.3*  11.6* 10.1* 9.5*  HGB 10.2* 9.6* 10.0* 10.0* 10.1*  HCT 33.4* 32.3* 32.9* 32.5* 34.3*  MCV 82.7 84.3 84.1 83.8 86.6  PLT 534* 508* 468* 448* 825    Basic Metabolic Panel: Recent Labs  Lab 10/14/21 0754  NA 134*  K 4.3  CL 95*  CO2 27  GLUCOSE 90  BUN 16  CREATININE 0.58*  CALCIUM 8.7*    GFR: Estimated Creatinine Clearance: 69.8 mL/min (A) (by C-G formula based on SCr of 0.58 mg/dL (L)). Liver Function Tests: Recent Labs  Lab 10/14/21 0754  AST 125*  ALT 46*  ALKPHOS 457*  BILITOT 1.1  PROT 6.8  ALBUMIN 2.3*    No results for input(s): LIPASE, AMYLASE in the last 168 hours.  No results for input(s): AMMONIA in the last 168 hours.  Coagulation Profile: No results for input(s): INR, PROTIME in the last 168 hours.  Cardiac Enzymes: No results for input(s): CKTOTAL, CKMB, CKMBINDEX, TROPONINI in the last 168 hours.  BNP (last 3 results) No results for input(s): PROBNP in the last 8760 hours. HbA1C: No results for input(s): HGBA1C in the last 72 hours. CBG: No results for input(s): GLUCAP in the last 168  hours. Lipid Profile: No results for input(s): CHOL, HDL, LDLCALC, TRIG, CHOLHDL, LDLDIRECT in the last 72 hours. Thyroid Function Tests: No results for input(s): TSH, T4TOTAL, FREET4, T3FREE, THYROIDAB in the last 72 hours. Anemia Panel: No results for input(s): VITAMINB12, FOLATE, FERRITIN, TIBC, IRON, RETICCTPCT in the last 72 hours. Sepsis Labs: No results for input(s): PROCALCITON, LATICACIDVEN in the last 168 hours.   Recent Results (from the past 240 hour(s))  Culture, blood (routine x 2)     Status: None   Collection Time: 10/11/21  8:12 AM   Specimen: BLOOD  Result Value Ref Range Status   Specimen Description   Final    BLOOD BLOOD RIGHT HAND Performed at [facility] [address], [place], [place] [address]    Special Requests   Final    BOTTLES DRAWN AEROBIC ONLY Blood Culture adequate volume Performed at [facility] [address], [place], [place] [address]    Culture   Final    NO GROWTH 5 DAYS Performed at [facility], [place] [address] [place], [place] [address]    Report Status 10/16/2021 FINAL  Final  Culture, blood (routine x 2)     Status: None   Collection Time: 10/11/21  8:15 AM   Specimen: BLOOD  Result Value Ref Range Status   Specimen Description   Final    BLOOD RIGHT ANTECUBITAL Performed at [facility] [address], [place], [place] [address]    Special Requests   Final    BOTTLES DRAWN AEROBIC ONLY Blood Culture adequate volume Performed at [facility] [address], [place], [place] [address]    Culture   Final    NO GROWTH 5 DAYS Performed at [facility], [place] [address], [place], [place] [address]    Report Status 10/16/2021 FINAL  Final  Urine Culture     Status: Abnormal   Collection Time: 10/11/21 11:10 AM   Specimen: Urine, Clean Catch  Result Value Ref Range Status   Specimen Description   Final    URINE, CLEAN CATCH Performed at  [facility], [place] [address] [place], [place] [address]    Special Requests   Final    NONE Performed at [facility], [place] [address], [place], [place] [address]    Culture (A)  Final    <10,000 COLONIES/mL INSIGNIFICANT GROWTH Performed at [facility] [address] [place], [place] [address]    Report Status 10/12/2021 FINAL  Final  Fungus Culture With Stain     Status: None (Preliminary result)   Collection Time: 10/12/21  3:11 PM   Specimen: PATH Cytology Peritoneal fluid  Result Value Ref Range Status   Fungus Stain Final report  Final    Comment: (NOTE) Performed At: [facility] [address] [place], [place] [id number] Rush Farmer MD KG:[id number]    Fungus (Mycology) Culture PENDING  Incomplete   Fungal Source PERITONEAL  Final    Comment: Performed at [facility], [place] [address], [place], [place] [address]  Body fluid culture w Gram Stain     Status: None   Collection Time: 10/12/21  3:11 PM   Specimen: PATH Cytology Peritoneal fluid  Result Value Ref Range Status   Specimen Description   Final    PERITONEAL Performed at [facility] [address] [place], [place] [address]    Special Requests   Final    NONE Performed at [facility], [place] [address] [place], [place] [address]    Gram Stain   Final    FEW WBC PRESENT, PREDOMINANTLY MONONUCLEAR NO ORGANISMS SEEN    Culture   Final    NO GROWTH 3 DAYS Performed at [facility] [address], [place],  [address]    Report Status 10/15/2021 FINAL  Final  Fungus Culture Result     Status: None   Collection Time: 10/12/21  3:11 PM  Result Value Ref Range Status   Result 1 Comment  Final    Comment: (NOTE) KOH/Calcofluor preparation:  no fungus observed. Performed At: [facility] [place], [place] [id number] Rush Farmer MD JJ:[id number]        Radiology Studies: US Paracentesis  Result Date: 10/19/2021 INDICATION: History of metastatic rectal cancer to the liver. Recurrent ascites. Request for therapeutic paracentesis. EXAM: ULTRASOUND GUIDED LEFT LOWER QUADRANT PARACENTESIS MEDICATIONS: 1% plain lidocaine, 5 mL COMPLICATIONS: None immediate. PROCEDURE: Informed written consent was obtained from the patient after a discussion of the risks, benefits and alternatives to treatment. A timeout was performed prior to the initiation of the procedure. Initial ultrasound scanning demonstrates a large amount of ascites within the left lower abdominal quadrant. The left lower abdomen was prepped and draped in the usual sterile fashion. 1% lidocaine was used for local anesthesia. Following this, a 19 gauge, 7-cm, Yueh catheter was introduced. An ultrasound image was saved for documentation purposes. The paracentesis was performed. The catheter was removed and a dressing was applied. The patient tolerated the procedure well without immediate post procedural complication. FINDINGS: A total of approximately 2.8 L of clear yellow fluid was removed. IMPRESSION: Successful ultrasound-guided paracentesis yielding 2.8 liters of peritoneal fluid. Read by: Ascencion Dike PA-C Electronically Signed   By: Markus Daft M.D.   On: 10/19/2021 12:45   IR IMAGING GUIDED PORT INSERTION  Result Date: 10/20/2021 CLINICAL DATA:  Metastatic colorectal carcinoma, needs durable venous access for planned treatment regimen EXAM: TUNNELED PORT CATHETER PLACEMENT WITH ULTRASOUND AND FLUOROSCOPIC GUIDANCE FLUOROSCOPY TIME:  30 seconds; 1 mGy ANESTHESIA/SEDATION: Intravenous Fentanyl 193mcg and Versed 2mg  were administered as conscious sedation during continuous monitoring of the patient's level of consciousness and physiological / cardiorespiratory status by  the radiology RN, with a total moderate sedation time of 14 minutes. TECHNIQUE: The procedure, risks, benefits, and alternatives were  explained to the patient. Questions regarding the procedure were encouraged and answered. The patient understands and consents to the procedure. Patency of the right IJ vein was confirmed with ultrasound with image documentation. An appropriate skin site was determined. Skin site was marked. Region was prepped using maximum barrier technique including cap and mask, sterile gown, sterile gloves, large sterile sheet, and Chlorhexidine as cutaneous antisepsis. The region was infiltrated locally with 1% lidocaine. Under real-time ultrasound guidance, the right IJ vein was accessed with a 21 gauge micropuncture needle; the needle tip within the vein was confirmed with ultrasound image documentation. Needle was exchanged over a 018 guidewire for transitional dilator, and vascular measurement was performed. A small incision was made on the right anterior chest wall and a subcutaneous pocket fashioned. The power-injectable port was positioned and its catheter tunneled to the right IJ dermatotomy site. The transitional dilator was exchanged over an Amplatz wire for a peel-away sheath, through which the port catheter, which had been trimmed to the appropriate length, was advanced and positioned under fluoroscopy with its tip at the cavoatrial junction. Spot chest radiograph confirms good catheter position and no pneumothorax. The port was flushed per protocol. The pocket was closed with deep interrupted and subcuticular continuous 3-0 Monocryl sutures. The incisions were covered with Dermabond then covered with a sterile dressing. The patient tolerated the procedure well. COMPLICATIONS: COMPLICATIONS None immediate IMPRESSION: Technically successful right IJ power-injectable port catheter placement. Ready for routine use. Electronically Signed   By: Lucrezia Europe M.D.   On: 10/20/2021 15:18     LOS: 10 days   Nita Sells, MD [facility]  10/20/2021, 4:28 PM

## 2021-10-21 ENCOUNTER — Telehealth: Payer: Self-pay | Admitting: Hematology

## 2021-10-21 ENCOUNTER — Other Ambulatory Visit: Payer: Self-pay | Admitting: Oncology

## 2021-10-21 DIAGNOSIS — C787 Secondary malignant neoplasm of liver and intrahepatic bile duct: Secondary | ICD-10-CM

## 2021-10-21 DIAGNOSIS — C801 Malignant (primary) neoplasm, unspecified: Secondary | ICD-10-CM

## 2021-10-21 LAB — CBC WITH DIFFERENTIAL/PLATELET
Abs Immature Granulocytes: 0.05 K/uL (ref 0.00–0.07)
Basophils Absolute: 0 K/uL (ref 0.0–0.1)
Basophils Relative: 0 %
Eosinophils Absolute: 0.1 K/uL (ref 0.0–0.5)
Eosinophils Relative: 1 %
HCT: 30.6 % — ABNORMAL LOW (ref 39.0–52.0)
Hemoglobin: 9.3 g/dL — ABNORMAL LOW (ref 13.0–17.0)
Immature Granulocytes: 0 %
Lymphocytes Relative: 8 %
Lymphs Abs: 1 K/uL (ref 0.7–4.0)
MCH: 26.3 pg (ref 26.0–34.0)
MCHC: 30.4 g/dL (ref 30.0–36.0)
MCV: 86.7 fL (ref 80.0–100.0)
Monocytes Absolute: 1.1 K/uL — ABNORMAL HIGH (ref 0.1–1.0)
Monocytes Relative: 9 %
Neutro Abs: 9.3 K/uL — ABNORMAL HIGH (ref 1.7–7.7)
Neutrophils Relative %: 82 %
Platelets: 386 K/uL (ref 150–400)
RBC: 3.53 MIL/uL — ABNORMAL LOW (ref 4.22–5.81)
RDW: 20.1 % — ABNORMAL HIGH (ref 11.5–15.5)
WBC: 11.5 K/uL — ABNORMAL HIGH (ref 4.0–10.5)
nRBC: 0 % (ref 0.0–0.2)

## 2021-10-21 LAB — RESP PANEL BY RT-PCR (FLU A&B, COVID) ARPGX2
Influenza A by PCR: NEGATIVE
Influenza B by PCR: NEGATIVE
SARS Coronavirus 2 by RT PCR: NEGATIVE

## 2021-10-21 MED ORDER — CHLORHEXIDINE GLUCONATE CLOTH 2 % EX PADS
6.0000 | MEDICATED_PAD | Freq: Every day | CUTANEOUS | Status: DC
  Administered 2021-10-21 – 2021-10-23 (×3): 6 via TOPICAL

## 2021-10-21 MED ORDER — DOCUSATE SODIUM 50 MG PO CAPS
50.0000 mg | ORAL_CAPSULE | Freq: Once | ORAL | Status: AC
  Administered 2021-10-21: 50 mg via ORAL
  Filled 2021-10-21: qty 1

## 2021-10-21 NOTE — TOC Initial Note (Signed)
Transition of Care [facility]) - Initial/Assessment Note    Patient Details  Name: Jason Mcguire MRN: [id number] Date of Birth: [birth year]  Transition of Care [facility]) CM/SW Contact:    Izear Pine, Marjie Skiff, RN Phone Number: 10/21/2021, 10:37 AM  Clinical Narrative:                 MD spoke with pt this am and now pt is agreeable to SNF placement. FL2 faxed out to area SNFs. TOC will provide SNF bed offers when available.  Expected Discharge Plan: Skilled Nursing Facility Barriers to Discharge: Continued Medical Work up   Patient Goals and CMS Choice Patient states their goals for this hospitalization and ongoing recovery are:: To go home      Expected Discharge Plan and Services Expected Discharge Plan: [place]   Discharge Planning Services: CM Consult   Living arrangements for the past 2 months: Single Family Home                       Prior Living Arrangements/Services Living arrangements for the past 2 months: Single Family Home Lives with:: Self Patient language and need for interpreter reviewed:: Yes Do you feel safe going back to the place where you live?: Yes      Need for Family Participation in Patient Care: Yes (Comment) Care giver support system in place?: Yes (comment)   Criminal Activity/Legal Involvement Pertinent to Current Situation/Hospitalization: No - Comment as needed  Activities of Daily Living Home Assistive Devices/Equipment: None ADL Screening (condition at time of admission) Patient's cognitive ability adequate to safely complete daily activities?: Yes Is the patient deaf or have difficulty hearing?: No Does the patient have difficulty seeing, even when wearing glasses/contacts?: No Does the patient have difficulty concentrating, remembering, or making decisions?: No Patient able to express need for assistance with ADLs?: Yes Does the patient have difficulty dressing or bathing?: No Independently performs ADLs?: Yes (appropriate for  developmental age) Does the patient have difficulty walking or climbing stairs?: No Weakness of Legs: Both Weakness of Arms/Hands: None  Permission Sought/Granted Permission sought to share information with : Facility Art therapist granted to share information with : Yes, Verbal Permission Granted              Emotional Assessment Appearance:: Appears older than stated age Attitude/Demeanor/Rapport: Engaged Affect (typically observed): Anxious Orientation: : Oriented to Self, Oriented to Place, Oriented to  Time, Oriented to Situation Alcohol / Substance Use: Not Applicable Psych Involvement: No (comment)  Admission diagnosis:  Malignant neoplasm metastatic to liver [facility]) [C78.7] Malignant neoplasm metastatic to liver with unknown primary site [facility]) [C78.7, C80.1] Patient Active Problem List   Diagnosis Date Noted   Palliative care by specialist    Protein-calorie malnutrition, severe 10/15/2021   Malignant neoplasm metastatic to liver with unknown primary site [facility]) 10/10/2021   Attention deficit disorder with hyperactivity    Anxiety disorder    Ascites, malignant    Weight loss    Anemia    Dehydration    Elevated CK    Elevated lactic acid level    Elevated troponin    Rectal cancer metastasized to liver ([place]) 10/07/2021   COPD (chronic obstructive pulmonary disease) ([place]) 05/06/2011   Smoker 05/06/2011   Chronic rhinitis 05/06/2011   PCP:  Kathyrn Lass, MD Pharmacy:   [id number] - [place], [place] [address] [place] [address] Phone: [phone] Fax:  [id number] - [place], [place] - [address] [place] [address] Phone: [phone] Fax: [phone]     Social Determinants of Health (SDOH) Interventions    Readmission Risk Interventions Readmission Risk  Prevention Plan 10/21/2021 10/19/2021  Transportation Screening Complete Complete  PCP or Specialist Appt within 5-7 Days Complete Complete  Home Care Screening Complete Complete  Medication Review (RN CM) Complete Complete  Some recent data might be hidden

## 2021-10-21 NOTE — NC FL2 (Signed)
[place] LEVEL OF CARE SCREENING TOOL     IDENTIFICATION  Patient Name: Jason Mcguire Birthdate: [birth year] Sex: male Admission Date (Current Location): 10/07/2021  [employer] and [place] Number:  Herbalist and Address:  [facility],  [place] [address], [place]      Provider Number: [id number]  Attending Physician Name and Address:  Nita Sells, MD  Relative Name and Phone Number:       Current Level of Care: Hospital Recommended Level of Care: [place] Prior Approval Number:    Date Approved/Denied:   PASRR Number: [id number]  Discharge Plan: SNF    Current Diagnoses: Patient Active Problem List   Diagnosis Date Noted   Palliative care by specialist    Protein-calorie malnutrition, severe 10/15/2021   Malignant neoplasm metastatic to liver with unknown primary site [facility]) 10/10/2021   Attention deficit disorder with hyperactivity    Anxiety disorder    Ascites, malignant    Weight loss    Anemia    Dehydration    Elevated CK    Elevated lactic acid level    Elevated troponin    Rectal cancer metastasized to liver ([place]) 10/07/2021   COPD (chronic obstructive pulmonary disease) ([place]) 05/06/2011   Smoker 05/06/2011   Chronic rhinitis 05/06/2011    Orientation RESPIRATION BLADDER Height & Weight     Self, Time, Situation, Place  O2 Continent Weight: 53.6 kg Height:  5\' 6"  (167.6 cm)  BEHAVIORAL SYMPTOMS/MOOD NEUROLOGICAL BOWEL NUTRITION STATUS      Continent Diet  AMBULATORY STATUS COMMUNICATION OF NEEDS Skin   Extensive Assist Verbally Normal                       Personal Care Assistance Level of Assistance  Bathing, Feeding, Dressing Bathing Assistance: Limited assistance Feeding assistance: Independent Dressing Assistance: Limited assistance     Functional Limitations Info  Sight, Hearing, Speech Sight Info: Adequate Hearing Info: Adequate Speech Info: Adequate     SPECIAL CARE FACTORS FREQUENCY  PT (By licensed PT), OT (By licensed OT)     PT Frequency: 5 x weekly OT Frequency: 5 x weekly            Contractures Contractures Info: Not present    Additional Factors Info  Allergies, Code Status Code Status Info: Full Allergies Info: Penicillins, Iodinated Contrast Media           Current Medications (10/21/2021):  This is the current hospital active medication list Current Facility-Administered Medications  Medication Dose Route Frequency Provider Last Rate Last Admin   0.9 %  sodium chloride infusion  250 mL Intravenous PRN Clarene Essex, MD 10 mL/hr at 10/08/21 1112 250 mL at 10/08/21 1112   acetaminophen (TYLENOL) tablet 650 mg  650 mg Oral Q6H PRN Clarene Essex, MD       Or   acetaminophen (TYLENOL) suppository 650 mg  650 mg Rectal Q6H PRN Clarene Essex, MD       albuterol (PROVENTIL) (2.5 MG/3ML) 0.083% nebulizer solution 2.5 mg  2.5 mg Nebulization Q2H PRN Clarene Essex, MD       ALPRAZolam Duanne Moron) tablet 1 mg  1 mg Oral TID PRN Clarene Essex, MD   1 mg at 10/20/21 2232   alum & mag hydroxide-simeth (MAALOX/MYLANTA) 200-200-20 MG/5ML suspension 30 mL  30 mL Oral Q6H PRN Clarene Essex, MD       amphetamine-dextroamphetamine (ADDERALL) tablet 20 mg  20 mg Oral BID  WC Nita Sells, MD   20 mg at 10/21/21 5797   Chlorhexidine Gluconate Cloth 2 % PADS 6 each  6 each Topical Daily Nita Sells, MD   6 each at 10/21/21 0933   feeding supplement (ENSURE ENLIVE / ENSURE PLUS) liquid 237 mL  237 mL Oral BID BM Clarene Essex, MD   237 mL at 10/21/21 0933   hydrALAZINE (APRESOLINE) injection 10 mg  10 mg Intravenous Q8H PRN Clarene Essex, MD       HYDROmorphone (DILAUDID) injection 1 mg  1 mg Intravenous Q3H PRN Clarene Essex, MD   1 mg at 10/20/21 2109   oxyCODONE (Oxy IR/ROXICODONE) immediate release tablet 10 mg  10 mg Oral Q3H PRN Clarene Essex, MD   10 mg at 10/21/21 0931   oxyCODONE (OXYCONTIN) 12 hr tablet 60 mg  60 mg Oral Q12H Freeman,  Gene, MD   60 mg at 10/21/21 0931   sodium chloride flush (NS) 0.9 % injection 3 mL  3 mL Intravenous Q12H Clarene Essex, MD   3 mL at 10/21/21 0932   sodium chloride flush (NS) 0.9 % injection 3 mL  3 mL Intravenous PRN Clarene Essex, MD       thiamine tablet 100 mg  100 mg Oral Daily Clarene Essex, MD   100 mg at 10/21/21 2820     Discharge Medications: Please see discharge summary for a list of discharge medications.  Relevant Imaging Results:  Relevant Lab Results:   Additional Information [id number]  [place], Marjie Skiff, RN

## 2021-10-21 NOTE — Progress Notes (Signed)
I discussed the case with pathology and the anal rectal biopsy was positive for adenocarcinoma poorly differentiated and I asked them to compare it to the liver biopsy just to be sure and further work-up and plans and treatment per oncology and please call us if we can be of any further assistance with this hospital stay

## 2021-10-21 NOTE — Progress Notes (Deleted)
Patient is having trouble passing stool, tried fecal disimpaction an informed NP of issue.

## 2021-10-21 NOTE — Progress Notes (Signed)
Patient is s/p paracentesis with IR on 10/19/20, contacted by RN for persistent leakage from the LLQ puncture site.   Puncture site to be Dermabonded, and recommend patient to lay on right side to prevent ascites accumulation on the LLQ where was punctured for paracentesis.   If patient continues to leak, he may benefit for repeat paracentesis.  Please call IR for questions and concerns.    [place] PA-C 10/21/2021 12:03 PM

## 2021-10-21 NOTE — Care Management Important Message (Signed)
Important Message  Patient Details IM Letter given to the Patient. Name: Jason Mcguire MRN: [id number] Date of Birth: December 05, 1955   Medicare Important Message Given:  Yes     Kerin Salen 10/21/2021, 12:50 PM

## 2021-10-21 NOTE — Progress Notes (Signed)
Daily Progress Note   Patient Name: Jason Mcguire       Date: 10/21/2021 DOB: 09/18/1956  Age: 66 y.o. MRN#: [id number] Attending Physician: Nita Sells, MD Primary Care Physician: Kathyrn Lass, MD Admit Date: 10/07/2021  Reason for Consultation/Follow-up: Establishing goals of care and Pain control  Subjective:  Jason Mcguire is resting in bed, appears comfortable, states he is 'hanging in there", went for port a cath placement on 10-20-21.        Length of Stay: 11  Current Medications: Scheduled Meds:   amphetamine-dextroamphetamine  20 mg Oral BID WC   Chlorhexidine Gluconate Cloth  6 each Topical Daily   feeding supplement  237 mL Oral BID BM   oxyCODONE  60 mg Oral Q12H   sodium chloride flush  3 mL Intravenous Q12H   thiamine  100 mg Oral Daily    Continuous Infusions:  sodium chloride 250 mL (10/08/21 1112)    PRN Meds: sodium chloride, acetaminophen **OR** acetaminophen, albuterol, ALPRAZolam, alum & mag hydroxide-simeth, hydrALAZINE, HYDROmorphone (DILAUDID) injection, oxyCODONE, sodium chloride flush  Physical Exam          resting comfortably, no distress Regular work of breathing No edema Abdomen less distended   Vital Signs: BP 129/74 (BP Location: Right Arm)    Pulse 83    Temp 97.8 F (36.6 C) (Oral)    Resp 16    Ht 5\' 6"  (1.676 m)    Wt 53.6 kg    SpO2 98%    BMI 19.07 kg/m  SpO2: SpO2: 98 % O2 Device: O2 Device: Room Air O2 Flow Rate: O2 Flow Rate (L/min): 2 L/min  Intake/output summary:  Intake/Output Summary (Last 24 hours) at 10/21/2021 1243 Last data filed at 10/21/2021 0932 Gross per 24 hour  Intake 243 ml  Output 250 ml  Net -7 ml    LBM: Last BM Date: 10/19/21 Baseline Weight: Weight: 57.2 kg Most recent weight: Weight: 53.6  kg       Palliative Assessment/Data:    Flowsheet Rows    Flowsheet Row Most Recent Value  Intake Tab   Referral Department Hospitalist  Unit at Time of Referral Oncology Unit  Palliative Care Primary Diagnosis Cancer  Date Notified 10/12/21  Palliative Care Type New Palliative care  Reason for referral Clarify Goals of Care, Pain  Date of Admission 10/07/21  Date first seen by Palliative Care 10/13/21  # of days Palliative referral response time 1 Day(s)  # of days IP prior to Palliative referral 5  Clinical Assessment   Palliative Performance Scale Score 40%  Psychosocial & Spiritual Assessment   Palliative Care Outcomes   Patient/Family meeting held? Yes  Who was at the meeting? 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 [facility]) 10/10/2021   Attention deficit disorder with hyperactivity    Anxiety disorder    Ascites, malignant    Weight loss    Anemia    Dehydration    Elevated CK    Elevated lactic acid level    Elevated troponin    Rectal cancer metastasized to liver ([place]) 10/07/2021   COPD (chronic obstructive pulmonary disease) ([place]) 05/06/2011   Smoker 05/06/2011   Chronic rhinitis 05/06/2011    Palliative Care Assessment & Plan   Patient Profile: Jason Mcguire is a 66 year old male with past medical history of asthma, ADHD and anxiety who presented with shortness of breath and worsened abdominal distention for about 3 weeks.  He was found out elevated LFTs, ascites with CT abdomen pelvis and liver lesions concerning for metastatic cancer.  There are also lesions noted in lungs on imaging.  He underwent paracentesis as well as liver biopsy.  Recommendations/Plan: Pain, cancer related:   Continue OxyContin to 60mg  twice daily.   Oxycodone 10mg  every 3 hours as first line pain  medication.     Chart reviewed, plan is now for SNF rehab, recommend palliative care on discharge.   Goals of Care and Additional Recommendations: Limitations on Scope of Treatment: Full Scope Treatment  Code Status:    Code Status Orders  (From admission, onward)           Start     Ordered   10/08/21 0031  Full code  Continuous        10/08/21 0031           Code Status History     This patient has a current code status but no historical code status.       Prognosis:  Unable to determine  Discharge Planning: Recommend outpatient palliative.   Care plan was discussed with IDT.  Thank you for allowing the Palliative Medicine Team to assist in the care of this patient. 25 minutes spent.   Loistine Chance, MD  Please contact Palliative Medicine Team phone at [phone] for questions and concerns.

## 2021-10-21 NOTE — TOC Progression Note (Addendum)
Transition of Care [facility]) - Progression Note    Patient Details  Name: JAYVEION STALLING MRN: [id number] Date of Birth: [birth year]  [facility] CM/SW Contact  Rosaline Ezekiel, Marjie Skiff, RN Phone Number: 10/21/2021, 3:12 PM  Clinical Narrative:    Pt provided with 3 SNF bed offers. He asked that I check back in with him tomorrow for SNF choice so he has time to ask his friends/family's advise on which facility to choose. Will check back for bed choice and start insurance auth at that time.   Expected Discharge Plan: Skilled Nursing Facility Barriers to Discharge: Continued Medical Work up  Expected Discharge Plan and Services Expected Discharge Plan: [place]   Discharge Planning Services: CM Consult   Living arrangements for the past 2 months: Single Family Home                       Social Determinants of Health (SDOH) Interventions    Readmission Risk Interventions Readmission Risk Prevention Plan 10/21/2021 10/19/2021  Transportation Screening Complete Complete  PCP or Specialist Appt within 5-7 Days Complete Complete  Home Care Screening Complete Complete  Medication Review (RN CM) Complete Complete  Some recent data might be hidden

## 2021-10-21 NOTE — Telephone Encounter (Signed)
Scheduled per 01/05 scheduled message, spoke with patient's sister. Patient will be notified.

## 2021-10-21 NOTE — Progress Notes (Addendum)
HEMATOLOGY-ONCOLOGY PROGRESS NOTE  ASSESSMENT AND PLAN: 1.  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  Malignant ascites 3.  Protein calorie malnutrition 4.  ADHD 5.  Anemia of chronic disease (cancer)  -Port-A-Cath was placed 1/5 in anticipation of outpatient chemotherapy.  I discussed with the patient that we will arrange for outpatient follow-up next week in our office to discuss further plans for chemotherapy.  We will also arrange chemo education on the same date.  Scheduling message has been sent. -Continue supplements.  Will need outpatient follow-up with our dietitian at the cancer center. Order has been placed.  -Recommend for the patient to continue to work with physical therapy.  He plans to discharge to SNF. -Reviewed plan for follow-up in our office following hospital discharge with the patient and his niece.  SUBJECTIVE: Reports increased abdominal pain and nausea this morning.  No vomiting reported.  The patient tells that he work with physical therapy yesterday and is planning for discharge to SNF.  Port-A-Cath was placed 1/5 and tolerated procedure well.  He has no other complaints this morning.  Niece is at the bedside.  REVIEW OF SYSTEMS:   Review of Systems  Constitutional:  Positive for malaise/fatigue. Negative for chills and fever.  HENT: Negative.    Eyes: Negative.   Respiratory: Negative.    Cardiovascular: Negative.   Gastrointestinal:  Positive for abdominal pain and nausea. Negative for vomiting.       Ascites present.  Musculoskeletal: Negative.   Skin: Negative.   Neurological: Negative.   Endo/Heme/Allergies: Negative.   Psychiatric/Behavioral: Negative.     I have reviewed the past medical history, past surgical history, social history and family history with the patient and they are unchanged from previous note.   PHYSICAL EXAMINATION: ECOG PERFORMANCE STATUS: 2 - Symptomatic, <50% confined to bed  Vitals:    10/20/21 2046 10/21/21 0429  BP: (!) 157/76 129/74  Pulse: 86 83  Resp: 16 16  Temp: 98.1 F (36.7 C) 97.8 F (36.6 C)  SpO2: 100% 98%   Filed Weights   10/07/21 1710 10/07/21 2358 10/16/21 1255  Weight: 57.2 kg 53.6 kg 53.6 kg    Intake/Output from previous day: 01/04 0701 - 01/05 0700 In: 240 [P.O.:240] Out: 250 [Urine:250]  Physical Exam Constitutional:      General: He is not in acute distress.    Appearance: He is ill-appearing.  HENT:     Head: Normocephalic.  Cardiovascular:     Rate and Rhythm: Normal rate and regular rhythm.  Pulmonary:     Effort: Pulmonary effort is normal. No respiratory distress.  Abdominal:     General: There is distension.     Comments: Ascites present  Skin:    General: Skin is warm and dry.  Neurological:     Mental Status: He is alert and oriented to person, place, and time.  Psychiatric:        Mood and Affect: Mood normal.        Behavior: Behavior normal.    LABORATORY DATA:  I have reviewed the data as listed CMP Latest Ref Rng & Units 10/14/2021 10/13/2021 10/12/2021  Glucose 70 - 99 mg/dL 90 115(H) 89  BUN 8 - 23 mg/dL 16 16 14   Creatinine 0.61 - 1.24 mg/dL 0.58(L) 0.69 0.54(L)  Sodium 135 - 145 mmol/L 134(L) 130(L) 135  Potassium 3.5 - 5.1 mmol/L 4.3 4.6 4.5  Chloride 98 - 111 mmol/L 95(L) 97(L) 98  CO2 22 -  32 mmol/L 27 25 27   Calcium 8.9 - 10.3 mg/dL 8.7(L) 8.2(L) 8.6(L)  Total Protein 6.5 - 8.1 g/dL 6.8 6.3(L) 6.8  Total Bilirubin 0.3 - 1.2 mg/dL 1.1 1.0 1.1  Alkaline Phos 38 - 126 U/L 457(H) 457(H) 414(H)  AST 15 - 41 U/L 125(H) 323(H) 329(H)  ALT 0 - 44 U/L 46(H) 65(H) 61(H)    Lab Results  Component Value Date   WBC 11.5 (H) 10/21/2021   HGB 9.3 (L) 10/21/2021   HCT 30.6 (L) 10/21/2021   MCV 86.7 10/21/2021   PLT 386 10/21/2021   NEUTROABS 9.3 (H) 10/21/2021    Lab Results  Component Value Date   CEA1 2,798.0 (H) 10/08/2021    CT ABDOMEN PELVIS WO CONTRAST  Result Date: 10/07/2021 CLINICAL  DATA:  Abdominal distension over the past 3 weeks, initial encounter EXAM: CT ABDOMEN AND PELVIS WITHOUT CONTRAST TECHNIQUE: Multidetector CT imaging of the abdomen and pelvis was performed following the standard protocol without IV contrast. COMPARISON:  06/25/2013 FINDINGS: Lower chest: 4 mm nodule is noted in the right lower lobe on the first image incompletely evaluated on this exam. Hepatobiliary: Liver demonstrates multiple hypodensities scattered throughout with some areas of increased attenuation identified within most consistent with metastatic disease. Gallbladder is unremarkable. Mild perihepatic ascites is seen. Pancreas: Unremarkable. No pancreatic ductal dilatation or surrounding inflammatory changes. Spleen: Normal in size without focal abnormality. Adrenals/Urinary Tract: Adrenal glands are within normal limits. Kidneys demonstrate no renal calculi or obstructive changes. The bladder is partially distended. Stomach/Bowel: Scattered diverticular change of the colon is noted. No obstructive or inflammatory changes are seen. No discrete mass is noted the appendix is not discretely visualized although no inflammatory changes to suggest appendicitis are noted. The stomach and small bowel appear within normal limits. Vascular/Lymphatic: Diffuse atherosclerotic calcifications of the abdominal aorta are noted without aneurysmal dilatation. No discrete lymphadenopathy is noted. Reproductive: Prostate is unremarkable. Other: Significant ascites is noted within the abdomen and pelvis. Changes consistent with prior hernia repair are noted. Dominant right-sided hydrocele is noted. Changes of prior hernia repair are seen along the lower anterior abdominal wall. Musculoskeletal: Degenerative changes of lumbar spine are noted. Scoliosis concave to the left is seen. Old rib fractures on the right are noted posteriorly. 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 Definitive primary is not  identified on this exam. Significant ascites consistent with the underlying metastatic disease. No obstructive changes are noted. Mild diverticular change is seen. 4 mm nodule in the right lower lobe incompletely evaluated on this exam. Given the findings in the abdomen, CT of the chest is recommended in the short-term for further evaluation and staging. Electronically Signed   By: Inez Catalina M.D.   On: 10/07/2021 19:12   DG Chest 2 View  Result Date: 10/07/2021 CLINICAL DATA:  Worsening shortness of breath and peripheral edema. EXAM: CHEST - 2 VIEW COMPARISON:  Chest x-ray 03/29/2011. FINDINGS: The heart size and mediastinal contours are within normal limits. Both lungs are clear. The visualized skeletal structures are unremarkable. IMPRESSION: No active cardiopulmonary disease. Electronically Signed   By: Ronney Asters M.D.   On: 10/07/2021 18:59   CT HEAD WO CONTRAST (5MM)  Result Date: 10/08/2021 CLINICAL DATA:  Metastatic disease evaluation EXAM: CT HEAD WITHOUT CONTRAST TECHNIQUE: Contiguous axial images were obtained from the base of the skull through the vertex without intravenous contrast. COMPARISON:  None. FINDINGS: Brain: There is no acute intracranial hemorrhage, mass effect, or edema. Gray-white differentiation is preserved. There  is no extra-axial fluid collection. Ventricles and sulci are within normal limits in size and configuration. Small chronic infarct right caudate head. Minimal patchy hypoattenuation in the supratentorial white matter is nonspecific but may reflect minor chronic microvascular ischemic changes. Vascular: There is atherosclerotic calcification at the skull base. Skull: Calvarium is unremarkable. Sinuses/Orbits: No acute finding. Other: None. IMPRESSION: No acute intracranial abnormality. No evidence of metastatic disease on this noncontrast study. Electronically Signed   By: Macy Mis M.D.   On: 10/08/2021 15:11   CT CHEST WO CONTRAST  Result Date:  10/08/2021 CLINICAL DATA:  Occult malignancy EXAM: CT CHEST WITHOUT CONTRAST TECHNIQUE: Multidetector CT imaging of the chest was performed following the standard protocol without IV contrast. COMPARISON:  Chest CT 05/28/2005 FINDINGS: Cardiovascular: Normal cardiac size. No pericardial disease. Coronary artery calcifications. Atherosclerotic calcifications of the aortic arch and descending aorta. The ascending aorta is nonaneurysmal. Mediastinum/Nodes: No mediastinal, hilar, or axillary lymphadenopathy. No mediastinal mass. Thyroid is unremarkable. The esophagus is unremarkable. The trachea is unremarkable. Lungs/Pleura: The central airways are patent. There is mild centrilobular emphysema. There is basilar subsegmental atelectasis. No focal airspace consolidation. There are multiple solid bilateral pulmonary nodules, largest measuring 5 mm in the left upper lobe (series 5, image 82). There is a 4 mm subpleural nodule in the right middle lobe (series 5, image 94). Additional scattered 2-3 mm nodules bilaterally. Upper Abdomen: Enlarged liver with multiple hypodensities scattered throughout as seen on yesterday CT concerning for metastatic disease. Abdominal ascites noted, also seen on recent abdominal CT. Musculoskeletal: Multilevel degenerative changes of the spine. There is sclerotic endplate change noted along the lower thoracic spine. There is no suspicious osseous lesion. Chronic right lower rib injuries. IMPRESSION: Multiple solid bilateral pulmonary nodules, largest measuring 5 mm, which could represent metastatic disease. No suspicious mass to suggest a primary pulmonary malignancy. Mild centrilobular emphysema.  Bibasilar subsegmental atelectasis. Diffuse hypodense liver lesions and abdominal ascites as seen on recent CT of the abdomen and pelvis, concerning for metastatic disease. Electronically Signed   By: Maurine Simmering M.D.   On: 10/08/2021 15:20   MR LIVER W WO CONTRAST  Result Date:  10/09/2021 CLINICAL DATA:  Evaluation of hepatic lesions, hepatocellular carcinoma. EXAM: MRI ABDOMEN WITHOUT AND WITH CONTRAST TECHNIQUE: Multiplanar multisequence MR imaging of the abdomen was performed both before and after the administration of intravenous contrast. CONTRAST:  77mL GADAVIST GADOBUTROL 1 MMOL/ML IV SOLN COMPARISON:  CT abdomen pelvis October 07, 2021 and chest CT October 08, 2021. FINDINGS: Despite efforts by the technologist and patient, motion artifact is present on today's exam and could not be eliminated. This reduces exam sensitivity and specificity. Lower chest: No acute abnormality. Hepatobiliary: Liver is enlarged with innumerable bilobar hepatic lesions, predominantly these demonstrate mildly hyperintense T2 signal with a peripheral rim of enhancement. For reference. There is a lesion in the posteromedial aspect of the right lobe of the liver measuring 3.8 x 3.1 cm on image 36/17 and a lesion in the posteromedial aspect of the left lobe of the liver which measures 15 x 13 mm on image 38/32. Pericholecystic fluid without gallbladder wall thickening or cholelithiasis is favored sequela of underlying hepatocellular disease. No biliary ductal dilation. Pancreas: Pancreas appears atrophic and poorly evaluated without pancreatic ductal dilation. Spleen:  Within normal limits in size and appearance. Adrenals/Urinary Tract: The bilateral adrenal glands appear unremarkable. No hydronephrosis. No solid enhancing renal mass. Stomach/Bowel: No acute abnormality on this limited evaluation. Vascular/Lymphatic: The portal, splenic and superior mesenteric veins  appear patent. Abdominal adenopathy for instance a periportal lymph node measuring 16 mm in short axis on image 66/9. Other:  Large volume abdominal ascites. Musculoskeletal: No suspicious bone lesions identified. IMPRESSION: Despite efforts by the technologist and patient, motion artifact is present on today's exam and could not be eliminated.  This reduces exam sensitivity and specificity. 1. Innumerable bilobar hepatic lesions, with imaging features most consistent with metastatic disease. Consider further evaluation with direct tissue sampling for more definitive characterization. 2. Abdominal adenopathy and large volume ascites, likely reflecting disease involvement. Electronically Signed   By: Dahlia Bailiff M.D.   On: 10/09/2021 19:35   US BIOPSY (LIVER)  Result Date: 10/12/2021 INDICATION: 66 year old with innumerable liver lesions. Findings are concerning for metastatic disease and tissue diagnosis is needed. Patient also has a large volume of ascites. EXAM: 1. Ultrasound-guided paracentesis 2. Ultrasound-guided liver lesion biopsy MEDICATIONS: Moderate sedation ANESTHESIA/SEDATION: Moderate (conscious) sedation was employed during this procedure. A total of Versed 2.0mg  and fentanyl 100 mcg was administered intravenously at the order of the provider performing the procedure. Total intra-service moderate sedation time: 26 minutes. Patient's level of consciousness and vital signs were monitored continuously by radiology nurse throughout the procedure under the supervision of the provider performing the procedure. FLUOROSCOPY TIME:  None COMPLICATIONS: None immediate. PROCEDURE: Informed written consent was obtained from the patient after a thorough discussion of the procedural risks, benefits and alternatives. All questions were addressed. A timeout was performed prior to the initiation of the procedure. The abdomen was evaluated with ultrasound. Ascites was identified around the liver. Subtle lesions throughout the liver. Hyperechoic lesion in left hepatic lobe was targeted for biopsy. The anterior and right side of the abdomen was prepped with chlorhexidine and sterile field was created. Maximal barrier sterile technique was utilized including caps, mask, sterile gowns, sterile gloves, sterile drape, hand hygiene and skin antiseptic. Right  lower abdomen was anesthetized with 1% lidocaine. A small incision was made. Using ultrasound guidance, a Safe-T-Centesis catheter was directed into perihepatic ascites. Paracentesis was performed. During the paracentesis, the left hepatic lobe was targeted for biopsy. The left upper abdomen was anesthetized with 1% lidocaine and a small incision was made. Using ultrasound guidance, 17 gauge coaxial needle was directed into the left hepatic lobe and directed into a hyperechoic lesion. Total of 4 core biopsies were obtained with an 18 gauge core device. Specimens placed in formalin. Gel-Foam slurry was injected through the 17 gauge needle as it was removed. Bandage placed at the biopsy site. Paracentesis catheter was removed. Bandage placed at the paracentesis site. FINDINGS: Liver is diffusely heterogeneous with poorly defined lesions. A hyperechoic lesion in left hepatic lobe was successfully biopsied. 1.9 L of amber colored ascites was removed. IMPRESSION: 1. Ultrasound-guided core biopsy of a left hepatic lesion. 2. Ultrasound-guided paracentesis.  1.9 L of fluid was removed. Electronically Signed   By: Markus Daft M.D.   On: 10/12/2021 16:00   US Paracentesis  Result Date: 10/19/2021 INDICATION: History of metastatic rectal cancer to the liver. Recurrent ascites. Request for therapeutic paracentesis. EXAM: ULTRASOUND GUIDED LEFT LOWER QUADRANT PARACENTESIS MEDICATIONS: 1% plain lidocaine, 5 mL COMPLICATIONS: None immediate. PROCEDURE: Informed written consent was obtained from the patient after a discussion of the risks, benefits and alternatives to treatment. A timeout was performed prior to the initiation of the procedure. Initial ultrasound scanning demonstrates a large amount of ascites within the left lower abdominal quadrant. The left lower abdomen was prepped and draped in the usual sterile fashion.  1% lidocaine was used for local anesthesia. Following this, a 19 gauge, 7-cm, Yueh catheter was  introduced. An ultrasound image was saved for documentation purposes. The paracentesis was performed. The catheter was removed and a dressing was applied. The patient tolerated the procedure well without immediate post procedural complication. FINDINGS: A total of approximately 2.8 L of clear yellow fluid was removed. IMPRESSION: Successful ultrasound-guided paracentesis yielding 2.8 liters of peritoneal fluid. Read by: Ascencion Dike PA-C Electronically Signed   By: Markus Daft M.D.   On: 10/19/2021 12:45   US Paracentesis  Result Date: 10/12/2021 INDICATION: 66 year old with innumerable liver lesions. Findings are concerning for metastatic disease and tissue diagnosis is needed. Patient also has a large volume of ascites. EXAM: 1. Ultrasound-guided paracentesis 2. Ultrasound-guided liver lesion biopsy MEDICATIONS: Moderate sedation ANESTHESIA/SEDATION: Moderate (conscious) sedation was employed during this procedure. A total of Versed 2.0mg  and fentanyl 100 mcg was administered intravenously at the order of the provider performing the procedure. Total intra-service moderate sedation time: 26 minutes. Patient's level of consciousness and vital signs were monitored continuously by radiology nurse throughout the procedure under the supervision of the provider performing the procedure. FLUOROSCOPY TIME:  None COMPLICATIONS: None immediate. PROCEDURE: Informed written consent was obtained from the patient after a thorough discussion of the procedural risks, benefits and alternatives. All questions were addressed. A timeout was performed prior to the initiation of the procedure. The abdomen was evaluated with ultrasound. Ascites was identified around the liver. Subtle lesions throughout the liver. Hyperechoic lesion in left hepatic lobe was targeted for biopsy. The anterior and right side of the abdomen was prepped with chlorhexidine and sterile field was created. Maximal barrier sterile technique was utilized  including caps, mask, sterile gowns, sterile gloves, sterile drape, hand hygiene and skin antiseptic. Right lower abdomen was anesthetized with 1% lidocaine. A small incision was made. Using ultrasound guidance, a Safe-T-Centesis catheter was directed into perihepatic ascites. Paracentesis was performed. During the paracentesis, the left hepatic lobe was targeted for biopsy. The left upper abdomen was anesthetized with 1% lidocaine and a small incision was made. Using ultrasound guidance, 17 gauge coaxial needle was directed into the left hepatic lobe and directed into a hyperechoic lesion. Total of 4 core biopsies were obtained with an 18 gauge core device. Specimens placed in formalin. Gel-Foam slurry was injected through the 17 gauge needle as it was removed. Bandage placed at the biopsy site. Paracentesis catheter was removed. Bandage placed at the paracentesis site. FINDINGS: Liver is diffusely heterogeneous with poorly defined lesions. A hyperechoic lesion in left hepatic lobe was successfully biopsied. 1.9 L of amber colored ascites was removed. IMPRESSION: 1. Ultrasound-guided core biopsy of a left hepatic lesion. 2. Ultrasound-guided paracentesis.  1.9 L of fluid was removed. Electronically Signed   By: Markus Daft M.D.   On: 10/12/2021 16:00   US Paracentesis  Result Date: 10/08/2021 INDICATION: Patient with history of asthma, ADHD, COPD, weight loss, recent imaging revealing diffuse hepatic metastatic disease, ascites, right lower lobe pulmonary nodule. Request received for diagnostic and therapeutic paracentesis. EXAM: ULTRASOUND GUIDED DIAGNOSTIC AND THERAPEUTIC PARACENTESIS MEDICATIONS: 10 mL 1% lidocaine COMPLICATIONS: None immediate. PROCEDURE: Informed written consent was obtained from the patient after a discussion of the risks, benefits and alternatives to treatment. A timeout was performed prior to the initiation of the procedure. Initial ultrasound scanning demonstrates a small amount of  ascites within the right lower abdominal quadrant. The right lower abdomen was prepped and draped in the usual sterile fashion. 1%  lidocaine was used for local anesthesia. Following this, a 19 gauge, 7-cm, Yueh catheter was introduced. An ultrasound image was saved for documentation purposes. The paracentesis was performed. The catheter was removed and a dressing was applied. The patient tolerated the procedure well without immediate post procedural complication. FINDINGS: A total of approximately 1.6 liters of clear, yellow fluid was removed. Samples were sent to the laboratory as requested by the clinical team. IMPRESSION: Successful ultrasound-guided diagnostic and therapeutic paracentesis yielding 1.6 liters of peritoneal fluid. Read by: Rowe Robert, PA-C Electronically Signed   By: Michaelle Birks M.D.   On: 10/08/2021 17:03   DG Chest Port 1 View  Result Date: 10/11/2021 CLINICAL DATA:  66 year old male with arrhythmia, leukocytosis. EXAM: PORTABLE CHEST 1 VIEW COMPARISON:  Chest CT 10/08/2021 and earlier. FINDINGS: Portable AP semi upright view at 0709 hours. Mildly lower lung volumes with evidence of chronic pulmonary hyperinflation on the recent CT demonstrating emphysema. Mediastinal contours remain within normal limits. Calcified aortic atherosclerosis. Visualized tracheal air column is within normal limits. Allowing for portable technique the lungs are clear. No pneumothorax or pleural effusion. Mild chronic lung base scarring. Paucity of bowel gas in the upper abdomen. No acute osseous abnormality identified. IMPRESSION: Emphysema (ICD10-J43.9). No acute cardiopulmonary abnormality. Electronically Signed   By: Genevie Ann M.D.   On: 10/11/2021 07:34   ECHOCARDIOGRAM COMPLETE  Result Date: 10/08/2021    ECHOCARDIOGRAM REPORT   Patient Name:   NYSIR FERGUSSON Date of Exam: 10/08/2021 Medical Rec #:  [id number]         Height:       66.0 in Accession #:    [id number]        Weight:       118.2 lb Date  of Birth:  April 24, 1956         BSA:          1.599 m Patient Age:    66 years          BP:           156/72 mmHg Patient Gender: M                 HR:           73 bpm. Exam Location:  Inpatient Procedure: 2D Echo, 3D Echo, Cardiac Doppler, Color Doppler and Strain Analysis Indications:    Elevated Troponin  History:        Patient has no prior history of Echocardiogram examinations.                 Signs/Symptoms:Shortness of Breath. Bilateral lower extremity                 edema.  Sonographer:    Darlina Sicilian RDCS Referring Phys: [place]  1. Left ventricular ejection fraction, by estimation, is 60 to 65%. Left ventricular ejection fraction by 3D volume is 62 %. The left ventricle has normal function. The left ventricle has no regional wall motion abnormalities. There is mild concentric left ventricular hypertrophy. Left ventricular diastolic parameters are consistent with Grade I diastolic dysfunction (impaired relaxation).  2. Right ventricular systolic function is normal. The right ventricular size is normal.  3. The mitral valve is normal in structure. No evidence of mitral valve regurgitation. No evidence of mitral stenosis. Moderate mitral annular calcification.  4. The aortic valve is tricuspid. There is mild calcification of the aortic valve. There is mild thickening of the aortic valve. Aortic valve regurgitation  is trivial. Aortic valve sclerosis is present, with no evidence of aortic valve stenosis.  5. The inferior vena cava is normal in size with greater than 50% respiratory variability, suggesting right atrial pressure of 3 mmHg. FINDINGS  Left Ventricle: Left ventricular ejection fraction, by estimation, is 60 to 65%. Left ventricular ejection fraction by 3D volume is 62 %. The left ventricle has normal function. The left ventricle has no regional wall motion abnormalities. Global longitudinal strain performed but not reported based on interpreter judgement due to  suboptimal tracking. The left ventricular internal cavity size was normal in size. There is mild concentric left ventricular hypertrophy. Left ventricular diastolic parameters are consistent with Grade I diastolic dysfunction (impaired relaxation). Normal left ventricular filling pressure. Right Ventricle: The right ventricular size is normal. No increase in right ventricular wall thickness. Right ventricular systolic function is normal. Left Atrium: Left atrial size was normal in size. Right Atrium: Right atrial size was normal in size. Pericardium: There is no evidence of pericardial effusion. Mitral Valve: The mitral valve is normal in structure. Moderate mitral annular calcification. No evidence of mitral valve regurgitation. No evidence of mitral valve stenosis. Tricuspid Valve: The tricuspid valve is normal in structure. Tricuspid valve regurgitation is not demonstrated. No evidence of tricuspid stenosis. Aortic Valve: The aortic valve is tricuspid. There is mild calcification of the aortic valve. There is mild thickening of the aortic valve. Aortic valve regurgitation is trivial. Aortic regurgitation PHT measures 411 msec. Aortic valve sclerosis is present, with no evidence of aortic valve stenosis. Pulmonic Valve: The pulmonic valve was normal in structure. Pulmonic valve regurgitation is not visualized. No evidence of pulmonic stenosis. Aorta: The aortic root is normal in size and structure. Venous: The inferior vena cava is normal in size with greater than 50% respiratory variability, suggesting right atrial pressure of 3 mmHg. IAS/Shunts: No atrial level shunt detected by color flow Doppler.  LEFT VENTRICLE PLAX 2D LVIDd:         3.80 cm         Diastology LVIDs:         2.30 cm         LV e' medial:    5.33 cm/s LV PW:         1.20 cm         LV E/e' medial:  8.7 LV IVS:        1.20 cm         LV e' lateral:   7.51 cm/s LVOT diam:     1.90 cm         LV E/e' lateral: 6.2 LV SV:         51 LV SV Index:    32 LVOT Area:     2.84 cm        3D Volume EF                                LV 3D EF:    Left                                             ventricul  ar                                             ejection                                             fraction                                             by 3D                                             volume is                                             62 %.                                 3D Volume EF:                                3D EF:        62 %                                LV EDV:       111 ml                                LV ESV:       42 ml                                LV SV:        69 ml RIGHT VENTRICLE RV S prime:     14.50 cm/s TAPSE (M-mode): 2.2 cm LEFT ATRIUM             Index        RIGHT ATRIUM          Index LA diam:        3.30 cm 2.06 cm/m   RA Area:     8.60 cm LA Vol (A2C):   59.0 ml 36.87 ml/m  RA Volume:   13.50 ml 8.44 ml/m LA Vol (A4C):   39.6 ml 24.76 ml/m LA Biplane Vol: 46.9 ml 29.33 ml/m  AORTIC VALVE LVOT Vmax:   85.70 cm/s LVOT Vmean:  46.500 cm/s LVOT VTI:    0.181 m AI PHT:      411 msec  AORTA Ao Root diam: 3.30 cm Ao Asc diam:  3.00 cm MITRAL VALVE MV Area (PHT): 1.85 cm    SHUNTS MV Decel Time: 409 msec    Systemic VTI:  0.18 m MV E velocity: 46.60 cm/s  Systemic Diam: 1.90 cm MV A velocity: 91.70 cm/s MV E/A ratio:  0.51 Mihai Croitoru MD Electronically signed by Sanda Klein MD Signature Date/Time: 10/08/2021/3:24:42 PM    Final    IR IMAGING GUIDED PORT INSERTION  Result Date: 10/20/2021 CLINICAL DATA:  Metastatic colorectal carcinoma, needs durable venous access for planned treatment regimen EXAM: TUNNELED PORT CATHETER PLACEMENT WITH ULTRASOUND AND FLUOROSCOPIC GUIDANCE FLUOROSCOPY TIME:  30 seconds; 1 mGy ANESTHESIA/SEDATION: Intravenous Fentanyl 12mcg and Versed 2mg  were administered as conscious sedation during continuous monitoring of the patient's level of  consciousness and physiological / cardiorespiratory status by the radiology RN, with a total moderate sedation time of 14 minutes. TECHNIQUE: The procedure, risks, benefits, and alternatives were explained to the patient. Questions regarding the procedure were encouraged and answered. The patient understands and consents to the procedure. Patency of the right IJ vein was confirmed with ultrasound with image documentation. An appropriate skin site was determined. Skin site was marked. Region was prepped using maximum barrier technique including cap and mask, sterile gown, sterile gloves, large sterile sheet, and Chlorhexidine as cutaneous antisepsis. The region was infiltrated locally with 1% lidocaine. Under real-time ultrasound guidance, the right IJ vein was accessed with a 21 gauge micropuncture needle; the needle tip within the vein was confirmed with ultrasound image documentation. Needle was exchanged over a 018 guidewire for transitional dilator, and vascular measurement was performed. A small incision was made on the right anterior chest wall and a subcutaneous pocket fashioned. The power-injectable port was positioned and its catheter tunneled to the right IJ dermatotomy site. The transitional dilator was exchanged over an Amplatz wire for a peel-away sheath, through which the port catheter, which had been trimmed to the appropriate length, was advanced and positioned under fluoroscopy with its tip at the cavoatrial junction. Spot chest radiograph confirms good catheter position and no pneumothorax. The port was flushed per protocol. The pocket was closed with deep interrupted and subcuticular continuous 3-0 Monocryl sutures. The incisions were covered with Dermabond then covered with a sterile dressing. The patient tolerated the procedure well. COMPLICATIONS: COMPLICATIONS None immediate IMPRESSION: Technically successful right IJ power-injectable port catheter placement. Ready for routine use.  Electronically Signed   By: Lucrezia Europe M.D.   On: 10/20/2021 15:18     No future appointments.    LOS: 11 days   Mikey Bussing, DNP, AGPCNP-BC, AOCNP 10/21/21   Addendum  I have seen the patient, examined him. I agree with the assessment and and plan and have edited the notes.   Patient remains to be frail and weak, spends most time in bed.  He is awaiting to be discharged to SNF. Another sister at bedside today.  I encouraged him to participate physical therapy and Occupational Therapy, and take nutritional supplement for his malnutrition.  I am concerned about his overall low performance status and deconditioning, not sure if he is able to do chemo. I will set up a follow-up appointment in the office with me next week, to make the final decision about chemotherapy. All questions were answered.   Truitt Merle  10/21/2021

## 2021-10-21 NOTE — Progress Notes (Addendum)
PROGRESS NOTE    Jason Mcguire  FOY:[id number] DOB: 1955-12-05 DOA: 10/07/2021 PCP: Kathyrn Lass, MD   Chief Complaint  Patient presents with   Abdominal Pain   Shortness of Breath    66 year old white male known history ADHD,  presented 10/08/2021 with SOB, worsening abdominal distension ongoing for about 3 weeks  PTA.Hx of heavy alcohol abuse when he was younger, currently drinks socially. In the ED, noted to have elevated LFTs, ascites with CT abd/pelvis showing liver lesions concerning for metastatic cancer.   Patient was admitted underwent further work-up.  He had paracentesis on 12/23 that showed no signs of infection. 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 Paracentesis complicated by leaking from the area of prior paracentesis site which has been addressed by interventional radiology Port-A-Cath placed 10/20/2021, will need outpatient follow-up breast cancer and scheduling message has been sent per them Continue Oxy IR 10 mg every 3 as needed Continue OxyContin 60 every 12  Not relieved by pain can use Dilaudid 1 mg every 3 as needed  Pain management Cancer related pain: Managed by palliative care, continue to adjust the pain regimen.pain is controlled.  Centrilobular emphysema noted in the CT chest.  Leukocytosis likely from metastatic disease.  No obvious infection present. Recent Labs  Lab 10/17/21 0614  10/18/21 0604 10/19/21 0452 10/20/21 0515 10/21/21 0543  WBC 12.8* 13.1* 11.8* 11.6* 11.5*     Iron-deficiency anemia likely from malignancy monitor hb.  Iron supplementation upon discharge.  Monitor hemoglobin here  Rectal bleeding episodes from rectal cancer.  Stable hemoglobin Recent Labs  Lab 10/17/21 0614 10/18/21 0604 10/19/21 0452 10/20/21 0515 10/21/21 0543  HGB 9.6* 10.0* 10.0* 10.1* 9.3*  HCT 32.3* 32.9* 32.5* 34.3* 30.6*     Elevated TSH with normal free T4 outpatient follow-up in 3 weeks  Hyponatremia -stable.  Anxiety disorder ADHD: Continue on Xanax and home adderal. May need to cut back her Adderall dose slightly to promote weight gain  Goals of care: Palliative care following closely, appreciate input from pain management.   Overall prognosis is poor patient and family understands they like to pursue further treatment of this incurable disease but treatable as per oncology.  Severe malnutrition Nutrition Problem: Severe Malnutrition Etiology: chronic illness Signs/Symptoms: severe fat depletion, severe muscle depletion, energy intake < or equal to 75% for > or equal to 1 month Interventions: Ensure Enlive (each supplement provides 350kcal and 20 grams of protein)   Significant weight loss 61.2 in Sept to  > 53.6  kg-suspected due to malignancy, severe protein malnutrition.  Supplement diet RD eval    DVT prophylaxis: SCDs Start: 10/08/21 0031.  Lovenox held 2/2 biopsy and rectal bleed Code Status:   Code Status: Full Code Family Communication:  Discussed with sister at bedside  Status is: Inpatient Remains inpatient appropriate because: For ongoing management of abdominal pain ascites and biopsy result Disposition: Currently  medically stable for discharge--being faxed out to skilled nursing facility   Objective: Vitals last 24 hrs: Vitals:  10/20/21 1216 10/20/21 1220 10/20/21 2046 10/21/21 0429  BP: (!) 148/76 125/76 (!) 157/76 129/74   Pulse: 85  86 83  Resp: (!) 9 19 16 16   Temp:   98.1 F (36.7 C) 97.8 F (36.6 C)  TempSrc:   Oral Oral  SpO2: 99%  100% 98%  Weight:      Height:       Weight change:   Intake/Output Summary (Last 24 hours) at 10/21/2021 1310 Last data filed at 10/21/2021 1251 Gross per 24 hour  Intake 483 ml  Output 250 ml  Net 233 ml    Net IO Since Admission: -206.09 mL [10/21/21 1310]   Physical Examination:  Coherent no distress remembered me from yesterday no chest pain no fever  EOMI NCAT no focal deficit CTA B no rales no rhonchi S1-S2 no murmur no rub no gallop Neurologically intact moving all 4 limbs Abdomen soft no rebound I did not examine paracentesis site No lower extremity edema no rash  Medications reviewed:  Scheduled Meds:  amphetamine-dextroamphetamine  20 mg Oral BID WC   Chlorhexidine Gluconate Cloth  6 each Topical Daily   feeding supplement  237 mL Oral BID BM   oxyCODONE  60 mg Oral Q12H   sodium chloride flush  3 mL Intravenous Q12H   thiamine  100 mg Oral Daily  Continuous Infusions:  sodium chloride 250 mL (10/08/21 1112)   Diet Order             Diet regular Room service appropriate? Yes; Fluid consistency: Thin  Diet effective now                  Nutrition Problem: Severe Malnutrition Etiology: chronic illness Signs/Symptoms: severe fat depletion, severe muscle depletion, energy intake < or equal to 75% for > or equal to 1 month Interventions: Ensure Enlive (each supplement provides 350kcal and 20 grams of protein) Weight change:   Wt Readings from Last 3 Encounters:  10/16/21 53.6 kg  01/25/19 59 kg  06/20/13 61.2 kg  Consultants:see note  Procedures:see note Antimicrobials: Anti-infectives (From admission, onward)    None      Culture/Microbiology    Component Value Date/Time   SDES  10/12/2021 1511    PERITONEAL Performed at [facility], [place] [address] [place], [place] [address]    SPECREQUEST   10/12/2021 1511    NONE Performed at [facility], [place] [address], [place], [place] [address]    CULT  10/12/2021 1511    NO GROWTH 3 DAYS Performed at [facility] [address] [place], [place] [address]    REPTSTATUS 10/15/2021 FINAL 10/12/2021 1511    Other culture-see note  Unresulted Labs (From admission, onward)     Start     Ordered   10/10/21 0500  CBC with Differential/Platelet  Daily,   R     Question:  Specimen collection method  Answer:  Lab=Lab collect   10/09/21 1744   Unscheduled  Occult blood card to lab, stool  As needed,   R      10/08/21 0249          Data Reviewed: I have personally reviewed following labs and imaging studies CBC: Recent Labs  Lab 10/17/21 0614 10/18/21 0604 10/19/21 0452 10/20/21 0515 10/21/21 0543  WBC 12.8* 13.1* 11.8* 11.6* 11.5*  NEUTROABS 10.3* 11.6* 10.1* 9.5* 9.3*  HGB 9.6* 10.0* 10.0* 10.1* 9.3*  HCT 32.3* 32.9* 32.5* 34.3* 30.6*  MCV  84.3 84.1 83.8 86.6 86.7  PLT 508* 468* 448* 395 924    Basic Metabolic Panel: No results for input(s): NA, K, CL, CO2, GLUCOSE, BUN, CREATININE, CALCIUM, MG, PHOS in the last 168 hours.  GFR: Estimated Creatinine Clearance: 69.8 mL/min (A) (by C-G formula based on SCr of 0.58 mg/dL (L)). Liver Function Tests: No results for input(s): AST, ALT, ALKPHOS, BILITOT, PROT, ALBUMIN in the last 168 hours.  No results for input(s): LIPASE, AMYLASE in the last 168 hours.  No results for input(s): AMMONIA in the last 168 hours.  Coagulation Profile: No results for input(s): INR, PROTIME in the last 168 hours.  Cardiac Enzymes: No results for input(s): CKTOTAL, CKMB, CKMBINDEX, TROPONINI in the last 168 hours.  BNP (last 3 results) No results for input(s): PROBNP in the last 8760 hours. HbA1C: No results for input(s): HGBA1C in the last 72 hours. CBG: No results for input(s): GLUCAP in the last 168 hours. Lipid Profile: No results for input(s): CHOL, HDL,  LDLCALC, TRIG, CHOLHDL, LDLDIRECT in the last 72 hours. Thyroid Function Tests: No results for input(s): TSH, T4TOTAL, FREET4, T3FREE, THYROIDAB in the last 72 hours. Anemia Panel: No results for input(s): VITAMINB12, FOLATE, FERRITIN, TIBC, IRON, RETICCTPCT in the last 72 hours. Sepsis Labs: No results for input(s): PROCALCITON, LATICACIDVEN in the last 168 hours.   Recent Results (from the past 240 hour(s))  Fungus Culture With Stain     Status: None (Preliminary result)   Collection Time: 10/12/21  3:11 PM   Specimen: PATH Cytology Peritoneal fluid  Result Value Ref Range Status   Fungus Stain Final report  Final    Comment: (NOTE) Performed At: [facility] [address], [place] [id number] Rush Farmer MD LG:[id number]    Fungus (Mycology) Culture PENDING  Incomplete   Fungal Source PERITONEAL  Final    Comment: Performed at [facility], [place] [address] [place], [place] [address]  Body fluid culture w Gram Stain     Status: None   Collection Time: 10/12/21  3:11 PM   Specimen: PATH Cytology Peritoneal fluid  Result Value Ref Range Status   Specimen Description   Final    PERITONEAL Performed at [facility] [address] [place], [place] [address]    Special Requests   Final    NONE Performed at [facility], [place] [address], [place], [place] [address]    Gram Stain   Final    FEW WBC PRESENT, PREDOMINANTLY MONONUCLEAR NO ORGANISMS SEEN    Culture   Final    NO GROWTH 3 DAYS Performed at [facility] [address], [place],  [address]    Report Status 10/15/2021 FINAL  Final  Fungus Culture Result     Status: None   Collection Time: 10/12/21  3:11 PM  Result Value Ref Range Status   Result 1 Comment  Final    Comment: (NOTE) KOH/Calcofluor preparation:  no fungus observed. Performed At: [facility] [place] [facility] [place], [place] [id number] Rush Farmer MD  JO:[id number]       Radiology Studies: IR IMAGING GUIDED PORT INSERTION  Result Date: 10/20/2021 CLINICAL DATA:  Metastatic colorectal carcinoma, needs durable venous access for planned treatment regimen EXAM: TUNNELED PORT CATHETER PLACEMENT WITH ULTRASOUND AND FLUOROSCOPIC GUIDANCE FLUOROSCOPY TIME:  30 seconds; 1 mGy ANESTHESIA/SEDATION: Intravenous Fentanyl 158mcg and Versed 2mg  were administered as conscious sedation during continuous monitoring of the patient's level of consciousness and physiological /  cardiorespiratory status by the radiology RN, with a total moderate sedation time of 14 minutes. TECHNIQUE: The procedure, risks, benefits, and alternatives were explained to the patient. Questions regarding the procedure were encouraged and answered. The patient understands and consents to the procedure. Patency of the right IJ vein was confirmed with ultrasound with image documentation. An appropriate skin site was determined. Skin site was marked. Region was prepped using maximum barrier technique including cap and mask, sterile gown, sterile gloves, large sterile sheet, and Chlorhexidine as cutaneous antisepsis. The region was infiltrated locally with 1% lidocaine. Under real-time ultrasound guidance, the right IJ vein was accessed with a 21 gauge micropuncture needle; the needle tip within the vein was confirmed with ultrasound image documentation. Needle was exchanged over a 018 guidewire for transitional dilator, and vascular measurement was performed. A small incision was made on the right anterior chest wall and a subcutaneous pocket fashioned. The power-injectable port was positioned and its catheter tunneled to the right IJ dermatotomy site. The transitional dilator was exchanged over an Amplatz wire for a peel-away sheath, through which the port catheter, which had been trimmed to the appropriate length, was advanced and positioned under fluoroscopy with its tip at the cavoatrial junction.  Spot chest radiograph confirms good catheter position and no pneumothorax. The port was flushed per protocol. The pocket was closed with deep interrupted and subcuticular continuous 3-0 Monocryl sutures. The incisions were covered with Dermabond then covered with a sterile dressing. The patient tolerated the procedure well. COMPLICATIONS: COMPLICATIONS None immediate IMPRESSION: Technically successful right IJ power-injectable port catheter placement. Ready for routine use. Electronically Signed   By: Lucrezia Europe M.D.   On: 10/20/2021 15:18     LOS: 11 days   Nita Sells, MD [facility]  10/21/2021, 1:10 PM

## 2021-10-22 LAB — CBC WITH DIFFERENTIAL/PLATELET
Abs Immature Granulocytes: 0.05 10*3/uL (ref 0.00–0.07)
Basophils Absolute: 0 10*3/uL (ref 0.0–0.1)
Basophils Relative: 0 %
Eosinophils Absolute: 0 10*3/uL (ref 0.0–0.5)
Eosinophils Relative: 0 %
HCT: 31.9 % — ABNORMAL LOW (ref 39.0–52.0)
Hemoglobin: 9.3 g/dL — ABNORMAL LOW (ref 13.0–17.0)
Immature Granulocytes: 0 %
Lymphocytes Relative: 7 %
Lymphs Abs: 0.8 10*3/uL (ref 0.7–4.0)
MCH: 25.5 pg — ABNORMAL LOW (ref 26.0–34.0)
MCHC: 29.2 g/dL — ABNORMAL LOW (ref 30.0–36.0)
MCV: 87.6 fL (ref 80.0–100.0)
Monocytes Absolute: 0.8 10*3/uL (ref 0.1–1.0)
Monocytes Relative: 7 %
Neutro Abs: 10.9 10*3/uL — ABNORMAL HIGH (ref 1.7–7.7)
Neutrophils Relative %: 86 %
Platelets: 379 10*3/uL (ref 150–400)
RBC: 3.64 MIL/uL — ABNORMAL LOW (ref 4.22–5.81)
RDW: 19.7 % — ABNORMAL HIGH (ref 11.5–15.5)
WBC: 12.7 10*3/uL — ABNORMAL HIGH (ref 4.0–10.5)
nRBC: 0 % (ref 0.0–0.2)

## 2021-10-22 LAB — COMPREHENSIVE METABOLIC PANEL
ALT: 41 U/L (ref 0–44)
AST: 108 U/L — ABNORMAL HIGH (ref 15–41)
Albumin: 2.2 g/dL — ABNORMAL LOW (ref 3.5–5.0)
Alkaline Phosphatase: 519 U/L — ABNORMAL HIGH (ref 38–126)
Anion gap: 10 (ref 5–15)
BUN: 19 mg/dL (ref 8–23)
CO2: 31 mmol/L (ref 22–32)
Calcium: 8.4 mg/dL — ABNORMAL LOW (ref 8.9–10.3)
Chloride: 95 mmol/L — ABNORMAL LOW (ref 98–111)
Creatinine, Ser: 0.48 mg/dL — ABNORMAL LOW (ref 0.61–1.24)
GFR, Estimated: >60 mL/min (ref >60)
Glucose, Bld: 123 mg/dL — ABNORMAL HIGH (ref 70–99)
Potassium: 4.3 mmol/L (ref 3.5–5.1)
Sodium: 136 mmol/L (ref 135–145)
Total Bilirubin: 0.7 mg/dL (ref 0.3–1.2)
Total Protein: 6.3 g/dL — ABNORMAL LOW (ref 6.5–8.1)

## 2021-10-22 MED ORDER — SENNOSIDES-DOCUSATE SODIUM 8.6-50 MG PO TABS
1.0000 | ORAL_TABLET | Freq: Every day | ORAL | Status: DC
  Administered 2021-10-22 – 2021-10-23 (×2): 1 via ORAL
  Filled 2021-10-22 (×2): qty 1

## 2021-10-22 MED ORDER — OXYCODONE HCL 10 MG PO TABS: 10.0000 mg | ORAL_TABLET | ORAL | 0 refills | Status: DC | PRN

## 2021-10-22 MED ORDER — OXYCODONE HCL ER 60 MG PO T12A: 60.0000 mg | EXTENDED_RELEASE_TABLET | Freq: Two times a day (BID) | ORAL | 0 refills | Status: DC

## 2021-10-22 MED ORDER — SENNOSIDES-DOCUSATE SODIUM 8.6-50 MG PO TABS: 1.0000 | ORAL_TABLET | Freq: Every day | ORAL | Status: AC

## 2021-10-22 MED ORDER — POLYETHYLENE GLYCOL 3350 17 G PO PACK
17.0000 g | PACK | Freq: Every day | ORAL | Status: DC
  Administered 2021-10-22 – 2021-10-23 (×2): 17 g via ORAL
  Filled 2021-10-22 (×2): qty 1

## 2021-10-22 MED ORDER — ALPRAZOLAM 1 MG PO TABS: 1.0000 mg | ORAL_TABLET | Freq: Two times a day (BID) | ORAL | 0 refills | Status: DC

## 2021-10-22 MED ORDER — AMPHETAMINE-DEXTROAMPHETAMINE 20 MG PO TABS: 20.0000 mg | ORAL_TABLET | Freq: Two times a day (BID) | ORAL | 0 refills | Status: AC

## 2021-10-22 MED ORDER — POLYETHYLENE GLYCOL 3350 17 G PO PACK: 17.0000 g | PACK | Freq: Every day | ORAL | 0 refills | Status: AC

## 2021-10-22 NOTE — Progress Notes (Signed)
Chaplain assisted Jason Mcguire in completing and notarizing HCPOA.  He has assigned his sisters to be his proxies.  Chaplain made copies for [place] and placed one in his chart.  [place], [place] Pager, [phone] 12:52 PM

## 2021-10-22 NOTE — TOC Progression Note (Addendum)
Transition of Care [facility]) - Progression Note    Patient Details  Name: Jason Mcguire MRN: [id number] Date of Birth: [birth year]  Transition of Care [facility]) CM/SW Contact  Tahani Potier, Marjie Skiff, RN Phone Number: 10/22/2021, 3:03 PM  Clinical Narrative: SNF bed offers discussed with pt and niece at bedside. [facility] was chosen. [employer] liaison alerted of bed acceptance. Insurance Josem Kaufmann was started with [facility] this am at 9:15.   [facility] has been called x 2 with response that it has gone to the Market researcher for review. Alerted MD and pt. [facility] liaison state that pt can come tomorrow if auth received.   Expected Discharge Plan: Skilled Nursing Facility Barriers to Discharge: Continued Medical Work up  Expected Discharge Plan and Services Expected Discharge Plan: [place]   Discharge Planning Services: CM Consult   Living arrangements for the past 2 months: Single Family Home Expected Discharge Date: 10/22/21                     Readmission Risk Interventions Readmission Risk Prevention Plan 10/21/2021 10/19/2021  Transportation Screening Complete Complete  PCP or Specialist Appt within 5-7 Days Complete Complete  Home Care Screening Complete Complete  Medication Review (RN CM) Complete Complete  Some recent data might be hidden

## 2021-10-22 NOTE — Progress Notes (Signed)
Chaplain received a consult that patient wished to create or update his AD.  Chaplain met with him and asked if he wished to update the AD he created last week.  He said that he still wishes for his sisters to be HCPOA but does not want them to be his financial POA.  Chaplain informed him that she could only assist with HCPOA.  He affirmed that the document from last week is correct. Evidently, it did not get uploaded in his chart.  Chaplain will email to [email or web address] to ensure it get uploaded.  Chaplain Janne Napoleon, Bcc PAger, [phone] 12:56 PM

## 2021-10-22 NOTE — Progress Notes (Signed)
Oxygen saturation 99% on Room air after removing 2 liters Oxygen via Nasal Cannula.

## 2021-10-22 NOTE — Discharge Summary (Signed)
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 In the ED, noted to have elevated LFTs, ascites with CT abd/pelvis showing liver lesions concerning for metastatic cancer.   Patient was admitted underwent further work-up.  He had paracentesis on 12/23 that showed no signs of infection. Underwent image guided liver biopsy and repeat paracentesis 12/27 Liver biopsy-resulted with adenocarcinoma consistent with metastatic colorectal adenocarcinoma--CEA [id number] Found to have nodular rectal mass change in stool pattern recently  12/31 underwent colonoscopy showing external hemorrhoids malignant tumor at anus and rectum and distal rectum    Ultimately underwent port placement in addition to paracentesis several times  Hospital Course:  Newly diagnosed  metastatic rectal adenocarcinoma to the liver, peritoneum and possibly lungs  Malignant ascites He is s/p paracentesis x 2 with 1.6 L & 1.9 l and colonoscopy. Paracentesis complicated by leaking from the area of prior paracentesis site which has been addressed by interventional radiology Port-A-Cath placed 10/20/2021, will need outpatient follow-up cancer center and scheduling message has been sent per them for this to occur Continue Oxy IR 10 mg every 3 as needed Continue OxyContin 60 every 12  Hard Rx given on discharge and will need close follow-up with oncology with regards to   Pain management Cancer related pain: Managed by palliative care, continue to adjust the pain regimen.pain is controlled.   Centrilobular emphysema noted in the CT chest.   Leukocytosis likely from metastatic disease.  No obvious infection present.   Iron-deficiency anemia likely from malignancy monitor hb.  Iron supplementation upon discharge.  Monitor hemoglobin here   Rectal bleeding episodes from rectal cancer.  Stable hemoglobin   Elevated TSH with normal free T4 outpatient follow-up in 3 weeks   Hyponatremia -stable.  Improved by discharge   Anxiety disorder ADHD: Continue on Xanax and home adderal. May need to cut back her Adderall dose slightly to promote weight gain   Goals of care: Palliative care following closely, appreciate input from pain management.   Overall prognosis is poor patient and family understands they like to pursue further treatment of this incurable disease but treatable as per oncology.   Severe malnutrition Nutrition Problem: Severe Malnutrition Etiology: chronic illness Signs/Symptoms: severe fat depletion, severe muscle depletion, energy intake < or equal to 75% for > or equal to 1 month Interventions: Ensure Enlive (each supplement provides 350kcal and 20 grams of protein)   Significant weight loss 61.2 in Sept to  >  53.6  kg-suspected due to malignancy, severe protein  malnutrition.  Supplement diet RD eval  Discharge Exam: Vitals:   10/21/21 2101 10/22/21 0443  BP: 132/72 128/71  Pulse: 88 80  Resp: 16 18  Temp: (!) 97.5 F (36.4 C) (!) 97.5 F (36.4 C)  SpO2: 99% 99%    Subj on day of d/c   Awake coherent in some pain a little sleepy had problems with constipation last night so we added a bowel regimen  General Exam on discharge  EOMI NCAT no focal deficit CTA B no added sound rales rhonchi Abdomen firm liver seems quite enlarged Abdomen distended No lower extremity edema ROM intact Neurologically intact although slightly sleepy Psych flat  Discharge Instructions   Discharge Instructions     Diet - low sodium heart healthy   Complete by: As directed    Diet - low sodium heart healthy   Complete by: As directed    Increase activity slowly   Complete by: As directed    Increase activity slowly   Complete by: As directed    No wound care   Complete by: As directed    No wound care   Complete by: As directed       Allergies as of 10/22/2021       Reactions   Penicillins Anaphylaxis   Iodinated Contrast Media Hives        Medication List     TAKE these medications    ALPRAZolam 1 MG tablet Commonly known as: XANAX Take 1 tablet (1 mg total) by mouth 2 (two) times daily.   amphetamine-dextroamphetamine 20 MG tablet Commonly known as: ADDERALL Take 1 tablet (20 mg total) by mouth 2 (two) times daily. What changed: Another medication with the same name was removed. Continue taking this medication, and follow the directions you see here.   Oxycodone HCl 10 MG Tabs Take 1 tablet (10 mg total) by mouth every 3 (three) hours as needed for breakthrough pain.   oxyCODONE 60 MG 12 hr tablet Commonly known as: OXYCONTIN Take 60 mg by mouth every 12 (twelve) hours.   polyethylene glycol 17 g packet Commonly known as: MIRALAX / GLYCOLAX Take 17 g by mouth daily.   senna-docusate 8.6-50 MG tablet Commonly known as:  Senokot-S Take 1 tablet by mouth at bedtime.       Allergies  Allergen Reactions   Penicillins Anaphylaxis   Iodinated Contrast Media Hives      The results of significant diagnostics from this hospitalization (including imaging, microbiology, ancillary and laboratory) are listed below for reference.    Significant Diagnostic Studies: CT ABDOMEN PELVIS WO CONTRAST  Result Date: 10/07/2021 CLINICAL DATA:  Abdominal distension over the past 3 weeks, initial encounter EXAM: CT ABDOMEN AND PELVIS WITHOUT CONTRAST TECHNIQUE: Multidetector CT imaging of the abdomen and pelvis was performed following the standard protocol without IV contrast. COMPARISON:  06/25/2013 FINDINGS: Lower chest: 4 mm nodule is noted in the right lower lobe on the first image incompletely evaluated on this exam. Hepatobiliary: Liver demonstrates multiple hypodensities scattered throughout with some areas of increased attenuation identified within most consistent with metastatic disease. Gallbladder is unremarkable. Mild perihepatic ascites is seen. Pancreas: Unremarkable. No pancreatic ductal dilatation or surrounding inflammatory changes. Spleen: Normal in size without focal abnormality. Adrenals/Urinary Tract: Adrenal glands are within normal limits. Kidneys demonstrate no renal calculi or obstructive changes. The bladder is partially distended. Stomach/Bowel: Scattered diverticular change of the colon is noted. No obstructive or inflammatory changes  are seen. No discrete mass is noted the appendix is not discretely visualized although no inflammatory changes to suggest appendicitis are noted. The stomach and small bowel appear within normal limits. Vascular/Lymphatic: Diffuse atherosclerotic calcifications of the abdominal aorta are noted without aneurysmal dilatation. No discrete lymphadenopathy is noted. Reproductive: Prostate is unremarkable. Other: Significant ascites is noted within the abdomen and pelvis. Changes  consistent with prior hernia repair are noted. Dominant right-sided hydrocele is noted. Changes of prior hernia repair are seen along the lower anterior abdominal wall. Musculoskeletal: Degenerative changes of lumbar spine are noted. Scoliosis concave to the left is seen. Old rib fractures on the right are noted posteriorly. 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 Definitive primary is not identified on this exam. Significant ascites consistent with the underlying metastatic disease. No obstructive changes are noted. Mild diverticular change is seen. 4 mm nodule in the right lower lobe incompletely evaluated on this exam. Given the findings in the abdomen, CT of the chest is recommended in the short-term for further evaluation and staging. Electronically Signed   By: Inez Catalina M.D.   On: 10/07/2021 19:12   DG Chest 2 View  Result Date: 10/07/2021 CLINICAL DATA:  Worsening shortness of breath and peripheral edema. EXAM: CHEST - 2 VIEW COMPARISON:  Chest x-ray 03/29/2011. FINDINGS: The heart size and mediastinal contours are within normal limits. Both lungs are clear. The visualized skeletal structures are unremarkable. IMPRESSION: No active cardiopulmonary disease. Electronically Signed   By: Ronney Asters M.D.   On: 10/07/2021 18:59   CT HEAD WO CONTRAST (5MM)  Result Date: 10/08/2021 CLINICAL DATA:  Metastatic disease evaluation EXAM: CT HEAD WITHOUT CONTRAST TECHNIQUE: Contiguous axial images were obtained from the base of the skull through the vertex without intravenous contrast. COMPARISON:  None. FINDINGS: Brain: There is no acute intracranial hemorrhage, mass effect, or edema. Gray-white differentiation is preserved. There is no extra-axial fluid collection. Ventricles and sulci are within normal limits in size and configuration. Small chronic infarct right caudate head. Minimal patchy hypoattenuation in the supratentorial white matter is nonspecific but may reflect  minor chronic microvascular ischemic changes. Vascular: There is atherosclerotic calcification at the skull base. Skull: Calvarium is unremarkable. Sinuses/Orbits: No acute finding. Other: None. IMPRESSION: No acute intracranial abnormality. No evidence of metastatic disease on this noncontrast study. Electronically Signed   By: Macy Mis M.D.   On: 10/08/2021 15:11   CT CHEST WO CONTRAST  Result Date: 10/08/2021 CLINICAL DATA:  Occult malignancy EXAM: CT CHEST WITHOUT CONTRAST TECHNIQUE: Multidetector CT imaging of the chest was performed following the standard protocol without IV contrast. COMPARISON:  Chest CT 05/28/2005 FINDINGS: Cardiovascular: Normal cardiac size. No pericardial disease. Coronary artery calcifications. Atherosclerotic calcifications of the aortic arch and descending aorta. The ascending aorta is nonaneurysmal. Mediastinum/Nodes: No mediastinal, hilar, or axillary lymphadenopathy. No mediastinal mass. Thyroid is unremarkable. The esophagus is unremarkable. The trachea is unremarkable. Lungs/Pleura: The central airways are patent. There is mild centrilobular emphysema. There is basilar subsegmental atelectasis. No focal airspace consolidation. There are multiple solid bilateral pulmonary nodules, largest measuring 5 mm in the left upper lobe (series 5, image 82). There is a 4 mm subpleural nodule in the right middle lobe (series 5, image 94). Additional scattered 2-3 mm nodules bilaterally. Upper Abdomen: Enlarged liver with multiple hypodensities scattered throughout as seen on yesterday CT concerning for metastatic disease. Abdominal ascites noted, also seen on recent abdominal CT. Musculoskeletal: Multilevel degenerative changes of the spine. There is sclerotic endplate  change noted along the lower thoracic spine. There is no suspicious osseous lesion. Chronic right lower rib injuries. IMPRESSION: Multiple solid bilateral pulmonary nodules, largest measuring 5 mm, which could  represent metastatic disease. No suspicious mass to suggest a primary pulmonary malignancy. Mild centrilobular emphysema.  Bibasilar subsegmental atelectasis. Diffuse hypodense liver lesions and abdominal ascites as seen on recent CT of the abdomen and pelvis, concerning for metastatic disease. Electronically Signed   By: Maurine Simmering M.D.   On: 10/08/2021 15:20   MR LIVER W WO CONTRAST  Result Date: 10/09/2021 CLINICAL DATA:  Evaluation of hepatic lesions, hepatocellular carcinoma. EXAM: MRI ABDOMEN WITHOUT AND WITH CONTRAST TECHNIQUE: Multiplanar multisequence MR imaging of the abdomen was performed both before and after the administration of intravenous contrast. CONTRAST:  61mL GADAVIST GADOBUTROL 1 MMOL/ML IV SOLN COMPARISON:  CT abdomen pelvis October 07, 2021 and chest CT October 08, 2021. FINDINGS: Despite efforts by the technologist and patient, motion artifact is present on today's exam and could not be eliminated. This reduces exam sensitivity and specificity. Lower chest: No acute abnormality. Hepatobiliary: Liver is enlarged with innumerable bilobar hepatic lesions, predominantly these demonstrate mildly hyperintense T2 signal with a peripheral rim of enhancement. For reference. There is a lesion in the posteromedial aspect of the right lobe of the liver measuring 3.8 x 3.1 cm on image 36/17 and a lesion in the posteromedial aspect of the left lobe of the liver which measures 15 x 13 mm on image 38/32. Pericholecystic fluid without gallbladder wall thickening or cholelithiasis is favored sequela of underlying hepatocellular disease. No biliary ductal dilation. Pancreas: Pancreas appears atrophic and poorly evaluated without pancreatic ductal dilation. Spleen:  Within normal limits in size and appearance. Adrenals/Urinary Tract: The bilateral adrenal glands appear unremarkable. No hydronephrosis. No solid enhancing renal mass. Stomach/Bowel: No acute abnormality on this limited evaluation.  Vascular/Lymphatic: The portal, splenic and superior mesenteric veins appear patent. Abdominal adenopathy for instance a periportal lymph node measuring 16 mm in short axis on image 66/9. Other:  Large volume abdominal ascites. Musculoskeletal: No suspicious bone lesions identified. IMPRESSION: Despite efforts by the technologist and patient, motion artifact is present on today's exam and could not be eliminated. This reduces exam sensitivity and specificity. 1. Innumerable bilobar hepatic lesions, with imaging features most consistent with metastatic disease. Consider further evaluation with direct tissue sampling for more definitive characterization. 2. Abdominal adenopathy and large volume ascites, likely reflecting disease involvement. Electronically Signed   By: Dahlia Bailiff M.D.   On: 10/09/2021 19:35   US BIOPSY (LIVER)  Result Date: 10/12/2021 INDICATION: 66 year old with innumerable liver lesions. Findings are concerning for metastatic disease and tissue diagnosis is needed. Patient also has a large volume of ascites. EXAM: 1. Ultrasound-guided paracentesis 2. Ultrasound-guided liver lesion biopsy MEDICATIONS: Moderate sedation ANESTHESIA/SEDATION: Moderate (conscious) sedation was employed during this procedure. A total of Versed 2.0mg  and fentanyl 100 mcg was administered intravenously at the order of the provider performing the procedure. Total intra-service moderate sedation time: 26 minutes. Patient's level of consciousness and vital signs were monitored continuously by radiology nurse throughout the procedure under the supervision of the provider performing the procedure. FLUOROSCOPY TIME:  None COMPLICATIONS: None immediate. PROCEDURE: Informed written consent was obtained from the patient after a thorough discussion of the procedural risks, benefits and alternatives. All questions were addressed. A timeout was performed prior to the initiation of the procedure. The abdomen was evaluated with  ultrasound. Ascites was identified around the liver. Subtle lesions throughout the liver.  Hyperechoic lesion in left hepatic lobe was targeted for biopsy. The anterior and right side of the abdomen was prepped with chlorhexidine and sterile field was created. Maximal barrier sterile technique was utilized including caps, mask, sterile gowns, sterile gloves, sterile drape, hand hygiene and skin antiseptic. Right lower abdomen was anesthetized with 1% lidocaine. A small incision was made. Using ultrasound guidance, a Safe-T-Centesis catheter was directed into perihepatic ascites. Paracentesis was performed. During the paracentesis, the left hepatic lobe was targeted for biopsy. The left upper abdomen was anesthetized with 1% lidocaine and a small incision was made. Using ultrasound guidance, 17 gauge coaxial needle was directed into the left hepatic lobe and directed into a hyperechoic lesion. Total of 4 core biopsies were obtained with an 18 gauge core device. Specimens placed in formalin. Gel-Foam slurry was injected through the 17 gauge needle as it was removed. Bandage placed at the biopsy site. Paracentesis catheter was removed. Bandage placed at the paracentesis site. FINDINGS: Liver is diffusely heterogeneous with poorly defined lesions. A hyperechoic lesion in left hepatic lobe was successfully biopsied. 1.9 L of amber colored ascites was removed. IMPRESSION: 1. Ultrasound-guided core biopsy of a left hepatic lesion. 2. Ultrasound-guided paracentesis.  1.9 L of fluid was removed. Electronically Signed   By: Markus Daft M.D.   On: 10/12/2021 16:00   US Paracentesis  Result Date: 10/19/2021 INDICATION: History of metastatic rectal cancer to the liver. Recurrent ascites. Request for therapeutic paracentesis. EXAM: ULTRASOUND GUIDED LEFT LOWER QUADRANT PARACENTESIS MEDICATIONS: 1% plain lidocaine, 5 mL COMPLICATIONS: None immediate. PROCEDURE: Informed written consent was obtained from the patient after a  discussion of the risks, benefits and alternatives to treatment. A timeout was performed prior to the initiation of the procedure. Initial ultrasound scanning demonstrates a large amount of ascites within the left lower abdominal quadrant. The left lower abdomen was prepped and draped in the usual sterile fashion. 1% lidocaine was used for local anesthesia. Following this, a 19 gauge, 7-cm, Yueh catheter was introduced. An ultrasound image was saved for documentation purposes. The paracentesis was performed. The catheter was removed and a dressing was applied. The patient tolerated the procedure well without immediate post procedural complication. FINDINGS: A total of approximately 2.8 L of clear yellow fluid was removed. IMPRESSION: Successful ultrasound-guided paracentesis yielding 2.8 liters of peritoneal fluid. Read by: Ascencion Dike PA-C Electronically Signed   By: Markus Daft M.D.   On: 10/19/2021 12:45   US Paracentesis  Result Date: 10/12/2021 INDICATION: 66 year old with innumerable liver lesions. Findings are concerning for metastatic disease and tissue diagnosis is needed. Patient also has a large volume of ascites. EXAM: 1. Ultrasound-guided paracentesis 2. Ultrasound-guided liver lesion biopsy MEDICATIONS: Moderate sedation ANESTHESIA/SEDATION: Moderate (conscious) sedation was employed during this procedure. A total of Versed 2.0mg  and fentanyl 100 mcg was administered intravenously at the order of the provider performing the procedure. Total intra-service moderate sedation time: 26 minutes. Patient's level of consciousness and vital signs were monitored continuously by radiology nurse throughout the procedure under the supervision of the provider performing the procedure. FLUOROSCOPY TIME:  None COMPLICATIONS: None immediate. PROCEDURE: Informed written consent was obtained from the patient after a thorough discussion of the procedural risks, benefits and alternatives. All questions were  addressed. A timeout was performed prior to the initiation of the procedure. The abdomen was evaluated with ultrasound. Ascites was identified around the liver. Subtle lesions throughout the liver. Hyperechoic lesion in left hepatic lobe was targeted for biopsy. The anterior and right side of  the abdomen was prepped with chlorhexidine and sterile field was created. Maximal barrier sterile technique was utilized including caps, mask, sterile gowns, sterile gloves, sterile drape, hand hygiene and skin antiseptic. Right lower abdomen was anesthetized with 1% lidocaine. A small incision was made. Using ultrasound guidance, a Safe-T-Centesis catheter was directed into perihepatic ascites. Paracentesis was performed. During the paracentesis, the left hepatic lobe was targeted for biopsy. The left upper abdomen was anesthetized with 1% lidocaine and a small incision was made. Using ultrasound guidance, 17 gauge coaxial needle was directed into the left hepatic lobe and directed into a hyperechoic lesion. Total of 4 core biopsies were obtained with an 18 gauge core device. Specimens placed in formalin. Gel-Foam slurry was injected through the 17 gauge needle as it was removed. Bandage placed at the biopsy site. Paracentesis catheter was removed. Bandage placed at the paracentesis site. FINDINGS: Liver is diffusely heterogeneous with poorly defined lesions. A hyperechoic lesion in left hepatic lobe was successfully biopsied. 1.9 L of amber colored ascites was removed. IMPRESSION: 1. Ultrasound-guided core biopsy of a left hepatic lesion. 2. Ultrasound-guided paracentesis.  1.9 L of fluid was removed. Electronically Signed   By: Markus Daft M.D.   On: 10/12/2021 16:00   US Paracentesis  Result Date: 10/08/2021 INDICATION: Patient with history of asthma, ADHD, COPD, weight loss, recent imaging revealing diffuse hepatic metastatic disease, ascites, right lower lobe pulmonary nodule. Request received for diagnostic and  therapeutic paracentesis. EXAM: ULTRASOUND GUIDED DIAGNOSTIC AND THERAPEUTIC PARACENTESIS MEDICATIONS: 10 mL 1% lidocaine COMPLICATIONS: None immediate. PROCEDURE: Informed written consent was obtained from the patient after a discussion of the risks, benefits and alternatives to treatment. A timeout was performed prior to the initiation of the procedure. Initial ultrasound scanning demonstrates a small amount of ascites within the right lower abdominal quadrant. The right lower abdomen was prepped and draped in the usual sterile fashion. 1% lidocaine was used for local anesthesia. Following this, a 19 gauge, 7-cm, Yueh catheter was introduced. An ultrasound image was saved for documentation purposes. The paracentesis was performed. The catheter was removed and a dressing was applied. The patient tolerated the procedure well without immediate post procedural complication. FINDINGS: A total of approximately 1.6 liters of clear, yellow fluid was removed. Samples were sent to the laboratory as requested by the clinical team. IMPRESSION: Successful ultrasound-guided diagnostic and therapeutic paracentesis yielding 1.6 liters of peritoneal fluid. Read by: Rowe Robert, PA-C Electronically Signed   By: Michaelle Birks M.D.   On: 10/08/2021 17:03   DG Chest Port 1 View  Result Date: 10/11/2021 CLINICAL DATA:  66 year old male with arrhythmia, leukocytosis. EXAM: PORTABLE CHEST 1 VIEW COMPARISON:  Chest CT 10/08/2021 and earlier. FINDINGS: Portable AP semi upright view at 0709 hours. Mildly lower lung volumes with evidence of chronic pulmonary hyperinflation on the recent CT demonstrating emphysema. Mediastinal contours remain within normal limits. Calcified aortic atherosclerosis. Visualized tracheal air column is within normal limits. Allowing for portable technique the lungs are clear. No pneumothorax or pleural effusion. Mild chronic lung base scarring. Paucity of bowel gas in the upper abdomen. No acute osseous  abnormality identified. IMPRESSION: Emphysema (ICD10-J43.9). No acute cardiopulmonary abnormality. Electronically Signed   By: Genevie Ann M.D.   On: 10/11/2021 07:34   ECHOCARDIOGRAM COMPLETE  Result Date: 10/08/2021    ECHOCARDIOGRAM REPORT   Patient Name:   JOHNATHON OLDEN Date of Exam: 10/08/2021 Medical Rec #:  [id number]         Height:  66.0 in Accession #:    [id number]        Weight:       118.2 lb Date of Birth:  1956-10-16         BSA:          1.599 m Patient Age:    89 years          BP:           156/72 mmHg Patient Gender: M                 HR:           73 bpm. Exam Location:  Inpatient Procedure: 2D Echo, 3D Echo, Cardiac Doppler, Color Doppler and Strain Analysis Indications:    Elevated Troponin  History:        Patient has no prior history of Echocardiogram examinations.                 Signs/Symptoms:Shortness of Breath. Bilateral lower extremity                 edema.  Sonographer:    Darlina Sicilian RDCS Referring Phys: [place]  1. Left ventricular ejection fraction, by estimation, is 60 to 65%. Left ventricular ejection fraction by 3D volume is 62 %. The left ventricle has normal function. The left ventricle has no regional wall motion abnormalities. There is mild concentric left ventricular hypertrophy. Left ventricular diastolic parameters are consistent with Grade I diastolic dysfunction (impaired relaxation).  2. Right ventricular systolic function is normal. The right ventricular size is normal.  3. The mitral valve is normal in structure. No evidence of mitral valve regurgitation. No evidence of mitral stenosis. Moderate mitral annular calcification.  4. The aortic valve is tricuspid. There is mild calcification of the aortic valve. There is mild thickening of the aortic valve. Aortic valve regurgitation is trivial. Aortic valve sclerosis is present, with no evidence of aortic valve stenosis.  5. The inferior vena cava is normal in size with greater  than 50% respiratory variability, suggesting right atrial pressure of 3 mmHg. FINDINGS  Left Ventricle: Left ventricular ejection fraction, by estimation, is 60 to 65%. Left ventricular ejection fraction by 3D volume is 62 %. The left ventricle has normal function. The left ventricle has no regional wall motion abnormalities. Global longitudinal strain performed but not reported based on interpreter judgement due to suboptimal tracking. The left ventricular internal cavity size was normal in size. There is mild concentric left ventricular hypertrophy. Left ventricular diastolic parameters are consistent with Grade I diastolic dysfunction (impaired relaxation). Normal left ventricular filling pressure. Right Ventricle: The right ventricular size is normal. No increase in right ventricular wall thickness. Right ventricular systolic function is normal. Left Atrium: Left atrial size was normal in size. Right Atrium: Right atrial size was normal in size. Pericardium: There is no evidence of pericardial effusion. Mitral Valve: The mitral valve is normal in structure. Moderate mitral annular calcification. No evidence of mitral valve regurgitation. No evidence of mitral valve stenosis. Tricuspid Valve: The tricuspid valve is normal in structure. Tricuspid valve regurgitation is not demonstrated. No evidence of tricuspid stenosis. Aortic Valve: The aortic valve is tricuspid. There is mild calcification of the aortic valve. There is mild thickening of the aortic valve. Aortic valve regurgitation is trivial. Aortic regurgitation PHT measures 411 msec. Aortic valve sclerosis is present, with no evidence of aortic valve stenosis. Pulmonic Valve: The pulmonic valve was normal in structure. Pulmonic valve regurgitation  is not visualized. No evidence of pulmonic stenosis. Aorta: The aortic root is normal in size and structure. Venous: The inferior vena cava is normal in size with greater than 50% respiratory variability,  suggesting right atrial pressure of 3 mmHg. IAS/Shunts: No atrial level shunt detected by color flow Doppler.  LEFT VENTRICLE PLAX 2D LVIDd:         3.80 cm         Diastology LVIDs:         2.30 cm         LV e' medial:    5.33 cm/s LV PW:         1.20 cm         LV E/e' medial:  8.7 LV IVS:        1.20 cm         LV e' lateral:   7.51 cm/s LVOT diam:     1.90 cm         LV E/e' lateral: 6.2 LV SV:         51 LV SV Index:   32 LVOT Area:     2.84 cm        3D Volume EF                                LV 3D EF:    Left                                             ventricul                                             ar                                             ejection                                             fraction                                             by 3D                                             volume is                                             62 %.                                 3D Volume EF:  3D EF:        62 %                                LV EDV:       111 ml                                LV ESV:       42 ml                                LV SV:        69 ml RIGHT VENTRICLE RV S prime:     14.50 cm/s TAPSE (M-mode): 2.2 cm LEFT ATRIUM             Index        RIGHT ATRIUM          Index LA diam:        3.30 cm 2.06 cm/m   RA Area:     8.60 cm LA Vol (A2C):   59.0 ml 36.87 ml/m  RA Volume:   13.50 ml 8.44 ml/m LA Vol (A4C):   39.6 ml 24.76 ml/m LA Biplane Vol: 46.9 ml 29.33 ml/m  AORTIC VALVE LVOT Vmax:   85.70 cm/s LVOT Vmean:  46.500 cm/s LVOT VTI:    0.181 m AI PHT:      411 msec  AORTA Ao Root diam: 3.30 cm Ao Asc diam:  3.00 cm MITRAL VALVE MV Area (PHT): 1.85 cm    SHUNTS MV Decel Time: 409 msec    Systemic VTI:  0.18 m MV E velocity: 46.60 cm/s  Systemic Diam: 1.90 cm MV A velocity: 91.70 cm/s MV E/A ratio:  0.51 Mihai Croitoru MD Electronically signed by Sanda Klein MD Signature Date/Time: 10/08/2021/3:24:42 PM    Final    IR IMAGING  GUIDED PORT INSERTION  Result Date: 10/20/2021 CLINICAL DATA:  Metastatic colorectal carcinoma, needs durable venous access for planned treatment regimen EXAM: TUNNELED PORT CATHETER PLACEMENT WITH ULTRASOUND AND FLUOROSCOPIC GUIDANCE FLUOROSCOPY TIME:  30 seconds; 1 mGy ANESTHESIA/SEDATION: Intravenous Fentanyl 138mcg and Versed 2mg  were administered as conscious sedation during continuous monitoring of the patient's level of consciousness and physiological / cardiorespiratory status by the radiology RN, with a total moderate sedation time of 14 minutes. TECHNIQUE: The procedure, risks, benefits, and alternatives were explained to the patient. Questions regarding the procedure were encouraged and answered. The patient understands and consents to the procedure. Patency of the right IJ vein was confirmed with ultrasound with image documentation. An appropriate skin site was determined. Skin site was marked. Region was prepped using maximum barrier technique including cap and mask, sterile gown, sterile gloves, large sterile sheet, and Chlorhexidine as cutaneous antisepsis. The region was infiltrated locally with 1% lidocaine. Under real-time ultrasound guidance, the right IJ vein was accessed with a 21 gauge micropuncture needle; the needle tip within the vein was confirmed with ultrasound image documentation. Needle was exchanged over a 018 guidewire for transitional dilator, and vascular measurement was performed. A small incision was made on the right anterior chest wall and a subcutaneous pocket fashioned. The power-injectable port was positioned and its catheter tunneled to the right IJ dermatotomy site. The transitional dilator was exchanged over an Amplatz wire for a peel-away sheath, through which the  port catheter, which had been trimmed to the appropriate length, was advanced and positioned under fluoroscopy with its tip at the cavoatrial junction. Spot chest radiograph confirms good catheter position and  no pneumothorax. The port was flushed per protocol. The pocket was closed with deep interrupted and subcuticular continuous 3-0 Monocryl sutures. The incisions were covered with Dermabond then covered with a sterile dressing. The patient tolerated the procedure well. COMPLICATIONS: COMPLICATIONS None immediate IMPRESSION: Technically successful right IJ power-injectable port catheter placement. Ready for routine use. Electronically Signed   By: Lucrezia Europe M.D.   On: 10/20/2021 15:18    Microbiology: Recent Results (from the past 240 hour(s))  Fungus Culture With Stain     Status: None (Preliminary result)   Collection Time: 10/12/21  3:11 PM   Specimen: PATH Cytology Peritoneal fluid  Result Value Ref Range Status   Fungus Stain Final report  Final    Comment: (NOTE) Performed At: [facility] [place], [place] [id number] Rush Farmer MD [id number]    Fungus (Mycology) Culture PENDING  Incomplete   Fungal Source PERITONEAL  Final    Comment: Performed at [facility], [place] [address], [place], [place] [address]  Body fluid culture w Gram Stain     Status: None   Collection Time: 10/12/21  3:11 PM   Specimen: PATH Cytology Peritoneal fluid  Result Value Ref Range Status   Specimen Description   Final    PERITONEAL Performed at [facility] [address], [place], [place] [address]    Special Requests   Final    NONE Performed at [facility], [place] [address], [place], [place] [address]    Gram Stain   Final    FEW WBC PRESENT, PREDOMINANTLY MONONUCLEAR NO ORGANISMS SEEN    Culture   Final    NO GROWTH 3 DAYS Performed at [place] [address] [place], [place] [address]    Report Status 10/15/2021 FINAL  Final  Fungus Culture Result     Status: None   Collection Time: 10/12/21  3:11 PM  Result Value Ref Range Status   Result 1 Comment  Final    Comment: (NOTE) KOH/Calcofluor  preparation:  no fungus observed. Performed At: [facility] [place], [place] [id number] Rush Farmer MD RP:[id number]   Resp Panel by RT-PCR (Flu A&B, Covid) Nasopharyngeal Swab     Status: None   Collection Time: 10/21/21  6:29 PM   Specimen: Nasopharyngeal Swab; Nasopharyngeal(NP) swabs in vial transport medium  Result Value Ref Range Status   SARS Coronavirus 2 by RT PCR NEGATIVE NEGATIVE Final    Comment: (NOTE) SARS-CoV-2 target nucleic acids are NOT DETECTED.  The SARS-CoV-2 RNA is generally detectable in upper respiratory specimens during the acute phase of infection. The lowest concentration of SARS-CoV-2 viral copies this assay can detect is 138 copies/mL. A negative result does not preclude SARS-Cov-2 infection and should not be used as the sole basis for treatment or other patient management decisions. A negative result may occur with  improper specimen collection/handling, submission of specimen other than nasopharyngeal swab, presence of viral mutation(s) within the areas targeted by this assay, and inadequate number of viral copies(<138 copies/mL). A negative result must be combined with clinical observations, patient history, and epidemiological information. The expected result is Negative.  Fact Sheet for Patients:  EntrepreneurPulse.com.au  Fact Sheet for Healthcare Providers:  IncredibleEmployment.be  This test is no t yet  approved or cleared by the [place] and  has been authorized for detection and/or diagnosis of SARS-CoV-2 by FDA under an Emergency Use Authorization (EUA). This EUA will remain  in effect (meaning this test can be used) for the duration of the COVID-19 declaration under Section 564(b)(1) of the Act, 21 U.S.C.section 360bbb-3(b)(1), unless the authorization is terminated  or revoked sooner.       Influenza A by PCR NEGATIVE NEGATIVE Final   Influenza B by PCR NEGATIVE  NEGATIVE Final    Comment: (NOTE) The Xpert Xpress SARS-CoV-2/FLU/RSV plus assay is intended as an aid in the diagnosis of influenza from Nasopharyngeal swab specimens and should not be used as a sole basis for treatment. Nasal washings and aspirates are unacceptable for Xpert Xpress SARS-CoV-2/FLU/RSV testing.  Fact Sheet for Patients: EntrepreneurPulse.com.au  Fact Sheet for Healthcare Providers: IncredibleEmployment.be  This test is not yet approved or cleared by the [place] FDA and has been authorized for detection and/or diagnosis of SARS-CoV-2 by FDA under an Emergency Use Authorization (EUA). This EUA will remain in effect (meaning this test can be used) for the duration of the COVID-19 declaration under Section 564(b)(1) of the Act, 21 U.S.C. section 360bbb-3(b)(1), unless the authorization is terminated or revoked.  Performed at [facility], [place] [address] [place], [place] [address]      Labs: Basic Metabolic Panel: Recent Labs  Lab 10/22/21 0954  NA 136  K 4.3  CL 95*  CO2 31  GLUCOSE 123*  BUN 19  CREATININE 0.48*  CALCIUM 8.4*   Liver Function Tests: Recent Labs  Lab 10/22/21 0954  AST 108*  ALT 41  ALKPHOS 519*  BILITOT 0.7  PROT 6.3*  ALBUMIN 2.2*   No results for input(s): LIPASE, AMYLASE in the last 168 hours. No results for input(s): AMMONIA in the last 168 hours. CBC: Recent Labs  Lab 10/18/21 0604 10/19/21 0452 10/20/21 0515 10/21/21 0543 10/22/21 0500  WBC 13.1* 11.8* 11.6* 11.5* 12.7*  NEUTROABS 11.6* 10.1* 9.5* 9.3* 10.9*  HGB 10.0* 10.0* 10.1* 9.3* 9.3*  HCT 32.9* 32.5* 34.3* 30.6* 31.9*  MCV 84.1 83.8 86.6 86.7 87.6  PLT 468* 448* 395 386 379   Cardiac Enzymes: No results for input(s): CKTOTAL, CKMB, CKMBINDEX, TROPONINI in the last 168 hours. BNP: BNP (last 3 results) Recent Labs    10/07/21 1819  BNP 351.8*    ProBNP (last 3 results) No results for  input(s): PROBNP in the last 8760 hours.  CBG: No results for input(s): GLUCAP in the last 168 hours.     Signed:  Nita Sells MD   [facility] 10/22/2021, 10:38 AM

## 2021-10-23 LAB — SURGICAL PATHOLOGY

## 2021-10-23 LAB — CBC WITH DIFFERENTIAL/PLATELET
Abs Immature Granulocytes: 0.04 10*3/uL (ref 0.00–0.07)
Basophils Absolute: 0.1 10*3/uL (ref 0.0–0.1)
Basophils Relative: 0 %
Eosinophils Absolute: 0.1 10*3/uL (ref 0.0–0.5)
Eosinophils Relative: 1 %
HCT: 30.4 % — ABNORMAL LOW (ref 39.0–52.0)
Hemoglobin: 9 g/dL — ABNORMAL LOW (ref 13.0–17.0)
Immature Granulocytes: 0 %
Lymphocytes Relative: 9 %
Lymphs Abs: 1 10*3/uL (ref 0.7–4.0)
MCH: 26.2 pg (ref 26.0–34.0)
MCHC: 29.6 g/dL — ABNORMAL LOW (ref 30.0–36.0)
MCV: 88.6 fL (ref 80.0–100.0)
Monocytes Absolute: 0.9 10*3/uL (ref 0.1–1.0)
Monocytes Relative: 7 %
Neutro Abs: 9.7 10*3/uL — ABNORMAL HIGH (ref 1.7–7.7)
Neutrophils Relative %: 83 %
Platelets: 416 10*3/uL — ABNORMAL HIGH (ref 150–400)
RBC: 3.43 MIL/uL — ABNORMAL LOW (ref 4.22–5.81)
RDW: 20 % — ABNORMAL HIGH (ref 11.5–15.5)
WBC: 11.8 10*3/uL — ABNORMAL HIGH (ref 4.0–10.5)
nRBC: 0 % (ref 0.0–0.2)

## 2021-10-23 MED ORDER — HEPARIN SOD (PORK) LOCK FLUSH 100 UNIT/ML IV SOLN
500.0000 [IU] | INTRAVENOUS | Status: AC | PRN
  Administered 2021-10-23: 500 [IU]
  Filled 2021-10-23: qty 5

## 2021-10-23 NOTE — TOC Transition Note (Signed)
Transition of Care [facility]) - CM/SW Discharge Note   Patient Details  Name: Jason Mcguire MRN: [id number] Date of Birth: 08/21/1956  Transition of Care [facility]) CM/SW Contact:  Ross Ludwig, LCSW Phone Number: 10/23/2021, 5:18 PM   Clinical Narrative:     Patient to be d/c'ed today to [facility] room 104.  Patient and family agreeable to plans will transport via ems RN to call report to [phone].  Patient to notify his family.    Final next level of care: Skilled Nursing Facility Barriers to Discharge: Barriers Resolved   Patient Goals and CMS Choice Patient states their goals for this hospitalization and ongoing recovery are:: To go to SNF for short term rehab, then return back home. CMS Medicare.gov Compare Post Acute Care list provided to:: Patient Choice offered to / list presented to : Patient  Discharge Placement PASRR number recieved: 10/22/21            Patient chooses bed at: [facility] Patient to be transferred to facility by: [facility] Name of family member notified: Patient to notify his niece Patient and family notified of of transfer: 10/23/21  Discharge Plan and Services   Discharge Planning Services: CM Consult                                 Social Determinants of Health ([place]) Interventions     Readmission Risk Interventions Readmission Risk Prevention Plan 10/21/2021 10/19/2021  Transportation Screening Complete Complete  PCP or Specialist Appt within 5-7 Days Complete Complete  Home Care Screening Complete Complete  Medication Review (RN CM) Complete Complete  Some recent data might be hidden

## 2021-10-23 NOTE — Progress Notes (Addendum)
2250 Paged Jeannette Corpus, NP about patient insisting that he is impacted. Pt has active bowel sounds in all 4 quads, is passing gas, and had a small, mushy BM this evening. Rectal exam performed and no hard stool noted in colon. Jeannette Corpus, NP stated he might be feeling pressure due to tumor in colon. Informed pt and inserted IV for IV pain meds. Pt in severe pain in rectum area. 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 Pt stated he has a hard time passing stool through it. Rectum noted to be hard, pt states that is normal, with minor bleeding from strain. RN advised the pt not to strain and reassured pt that we will continue to monitor him.    2330 Report called to [facility].   0130 [facility] here to take pt to [facility]. Pt medicated for pain prior to discharging. VSS, NAD, pt sent with all personal belongings in room.

## 2021-10-23 NOTE — Discharge Summary (Signed)
Physician Discharge Summary  [name]:[id number] DOB: [birth year] DOA: 10/07/2021 Patient not discharged on 1/6-reviewed on 1/7 no overt changes to discharge summary stable for transfer to skilled facility when bed is available today    PCP: Kathyrn Lass, MD  Admit date: 10/07/2021 Discharge date: 10/23/2021  Time spent: 45 minutes  Recommendations for Outpatient Follow-up:  Recommend Chem-12 CBC in about 1 week Will need transfer to cancer center for oncology follow-up and discussion about his illness-please coordinate with skilled facility Consider a repeat TSH in about 3 weeks in the outpatient setting Consider cutting back Adderall in the outpatient setting to promote weight gain  Discharge Diagnoses:  MAIN problem for hospitalization   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 In the ED, noted to have elevated LFTs, ascites with CT abd/pelvis showing liver lesions concerning for metastatic cancer.   Patient was admitted underwent further work-up.  He had paracentesis on 12/23 that showed no signs of infection. Underwent image guided liver biopsy and repeat paracentesis 12/27 Liver biopsy-resulted with adenocarcinoma consistent with metastatic colorectal adenocarcinoma--CEA [id number] Found to have nodular rectal mass change in stool pattern recently  12/31 underwent colonoscopy showing external hemorrhoids malignant tumor at  anus and rectum and distal rectum    Ultimately underwent port placement in addition to paracentesis several times  Hospital Course:  Newly diagnosed metastatic rectal adenocarcinoma to the liver, peritoneum and possibly lungs  Malignant ascites He is s/p paracentesis x 2 with 1.6 L & 1.9 l and colonoscopy. Paracentesis complicated by leaking from the area of prior paracentesis site which has been addressed by interventional radiology Port-A-Cath placed 10/20/2021, will need outpatient follow-up cancer center and scheduling message has been sent per them for this to occur Continue Oxy IR 10 mg every 3 as needed Continue OxyContin 60 every 12  Hard Rx given on discharge and will need close follow-up with oncology with regards to   Pain management Cancer related pain: Managed by palliative care, continue to adjust the pain regimen.pain is controlled.   Centrilobular emphysema noted in the CT chest.   Leukocytosis likely from metastatic disease.  No obvious infection present.   Iron-deficiency anemia likely from malignancy monitor hb.  Iron supplementation upon discharge.  Monitor hemoglobin here   Rectal bleeding episodes from rectal cancer.  Stable hemoglobin   Elevated TSH with normal free T4 outpatient follow-up in 3 weeks   Hyponatremia -stable.  Improved by discharge   Anxiety disorder ADHD: Continue on Xanax and home adderal. May need to cut back her Adderall dose slightly to promote weight gain   Goals of care: Palliative care following closely, appreciate input from pain management.   Overall prognosis is poor patient and family understands they like to pursue further treatment of this incurable disease but treatable as per oncology.   Severe malnutrition Nutrition Problem: Severe Malnutrition Etiology: chronic illness Signs/Symptoms: severe fat depletion, severe muscle depletion, energy intake < or equal to 75% for > or equal  to 1 month Interventions: Ensure Enlive  (each supplement provides 350kcal and 20 grams of protein)   Significant weight loss 61.2 in Sept to  > 53.6  kg-suspected due to malignancy, severe protein malnutrition.  Supplement diet RD eval  Discharge Exam: Vitals:   10/22/21 2116 10/23/21 0525  BP: (!) 167/75 (!) 153/85  Pulse: 90 82  Resp: 16 14  Temp: (!) 97.4 F (36.3 C) 97.7 F (36.5 C)  SpO2: 100% 94%    Subj on day of d/c   sleepy this morning when I saw him  General Exam on discharge  EOMI NCAT no focal deficit CTA B no added sound rales rhonchi Abdomen firm liver seems quite enlarged Abdomen distended--- no leakage from paracentesis site from prior Rest of exam unchanged   Discharge Instructions   Discharge Instructions     Diet - low sodium heart healthy   Complete by: As directed    Diet - low sodium heart healthy   Complete by: As directed    Increase activity slowly   Complete by: As directed    Increase activity slowly   Complete by: As directed    No wound care   Complete by: As directed    No wound care   Complete by: As directed       Allergies as of 10/23/2021       Reactions   Penicillins Anaphylaxis   Iodinated Contrast Media Hives        Medication List     TAKE these medications    ALPRAZolam 1 MG tablet Commonly known as: XANAX Take 1 tablet (1 mg total) by mouth 2 (two) times daily.   amphetamine-dextroamphetamine 20 MG tablet Commonly known as: ADDERALL Take 1 tablet (20 mg total) by mouth 2 (two) times daily. What changed: Another medication with the same name was removed. Continue taking this medication, and follow the directions you see here.   Oxycodone HCl 10 MG Tabs Take 1 tablet (10 mg total) by mouth every 3 (three) hours as needed for breakthrough pain.   oxyCODONE 60 MG 12 hr tablet Commonly known as: OXYCONTIN Take 60 mg by mouth every 12 (twelve) hours.   polyethylene glycol 17 g packet Commonly known as: MIRALAX / GLYCOLAX Take 17 g by mouth  daily.   senna-docusate 8.6-50 MG tablet Commonly known as: Senokot-S Take 1 tablet by mouth at bedtime.       Allergies  Allergen Reactions   Penicillins Anaphylaxis   Iodinated Contrast Media Hives      The results of significant diagnostics from this hospitalization (including imaging, microbiology, ancillary and laboratory) are listed below for reference.    Significant Diagnostic Studies: CT ABDOMEN PELVIS WO CONTRAST  Result Date: 10/07/2021 CLINICAL DATA:  Abdominal distension over the past 3 weeks, initial encounter EXAM: CT ABDOMEN AND PELVIS WITHOUT CONTRAST TECHNIQUE: Multidetector CT imaging of the abdomen and pelvis was performed following the standard protocol without IV contrast. COMPARISON:  06/25/2013 FINDINGS: Lower chest: 4 mm nodule is noted in the right lower lobe on the first image incompletely evaluated on this exam. Hepatobiliary: Liver demonstrates multiple hypodensities scattered throughout with some areas of increased attenuation identified within most consistent with metastatic disease. Gallbladder is unremarkable. Mild perihepatic ascites is seen. Pancreas: Unremarkable. No pancreatic ductal dilatation or surrounding inflammatory changes. Spleen: Normal in size without focal abnormality. Adrenals/Urinary Tract: Adrenal glands are within normal limits. Kidneys demonstrate no renal calculi or obstructive changes. The bladder is partially distended. Stomach/Bowel:  Scattered diverticular change of the colon is noted. No obstructive or inflammatory changes are seen. No discrete mass is noted the appendix is not discretely visualized although no inflammatory changes to suggest appendicitis are noted. The stomach and small bowel appear within normal limits. Vascular/Lymphatic: Diffuse atherosclerotic calcifications of the abdominal aorta are noted without aneurysmal dilatation. No discrete lymphadenopathy is noted. Reproductive: Prostate is unremarkable. Other:  Significant ascites is noted within the abdomen and pelvis. Changes consistent with prior hernia repair are noted. Dominant right-sided hydrocele is noted. Changes of prior hernia repair are seen along the lower anterior abdominal wall. Musculoskeletal: Degenerative changes of lumbar spine are noted. Scoliosis concave to the left is seen. Old rib fractures on the right are noted posteriorly. 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 Definitive primary is not identified on this exam. Significant ascites consistent with the underlying metastatic disease. No obstructive changes are noted. Mild diverticular change is seen. 4 mm nodule in the right lower lobe incompletely evaluated on this exam. Given the findings in the abdomen, CT of the chest is recommended in the short-term for further evaluation and staging. Electronically Signed   By: Inez Catalina M.D.   On: 10/07/2021 19:12   DG Chest 2 View  Result Date: 10/07/2021 CLINICAL DATA:  Worsening shortness of breath and peripheral edema. EXAM: CHEST - 2 VIEW COMPARISON:  Chest x-ray 03/29/2011. FINDINGS: The heart size and mediastinal contours are within normal limits. Both lungs are clear. The visualized skeletal structures are unremarkable. IMPRESSION: No active cardiopulmonary disease. Electronically Signed   By: Ronney Asters M.D.   On: 10/07/2021 18:59   CT HEAD WO CONTRAST (5MM)  Result Date: 10/08/2021 CLINICAL DATA:  Metastatic disease evaluation EXAM: CT HEAD WITHOUT CONTRAST TECHNIQUE: Contiguous axial images were obtained from the base of the skull through the vertex without intravenous contrast. COMPARISON:  None. FINDINGS: Brain: There is no acute intracranial hemorrhage, mass effect, or edema. Gray-white differentiation is preserved. There is no extra-axial fluid collection. Ventricles and sulci are within normal limits in size and configuration. Small chronic infarct right caudate head. Minimal patchy  hypoattenuation in the supratentorial white matter is nonspecific but may reflect minor chronic microvascular ischemic changes. Vascular: There is atherosclerotic calcification at the skull base. Skull: Calvarium is unremarkable. Sinuses/Orbits: No acute finding. Other: None. IMPRESSION: No acute intracranial abnormality. No evidence of metastatic disease on this noncontrast study. Electronically Signed   By: Macy Mis M.D.   On: 10/08/2021 15:11   CT CHEST WO CONTRAST  Result Date: 10/08/2021 CLINICAL DATA:  Occult malignancy EXAM: CT CHEST WITHOUT CONTRAST TECHNIQUE: Multidetector CT imaging of the chest was performed following the standard protocol without IV contrast. COMPARISON:  Chest CT 05/28/2005 FINDINGS: Cardiovascular: Normal cardiac size. No pericardial disease. Coronary artery calcifications. Atherosclerotic calcifications of the aortic arch and descending aorta. The ascending aorta is nonaneurysmal. Mediastinum/Nodes: No mediastinal, hilar, or axillary lymphadenopathy. No mediastinal mass. Thyroid is unremarkable. The esophagus is unremarkable. The trachea is unremarkable. Lungs/Pleura: The central airways are patent. There is mild centrilobular emphysema. There is basilar subsegmental atelectasis. No focal airspace consolidation. There are multiple solid bilateral pulmonary nodules, largest measuring 5 mm in the left upper lobe (series 5, image 82). There is a 4 mm subpleural nodule in the right middle lobe (series 5, image 94). Additional scattered 2-3 mm nodules bilaterally. Upper Abdomen: Enlarged liver with multiple hypodensities scattered throughout as seen on yesterday CT concerning for metastatic disease. Abdominal ascites noted, also seen on recent  abdominal CT. Musculoskeletal: Multilevel degenerative changes of the spine. There is sclerotic endplate change noted along the lower thoracic spine. There is no suspicious osseous lesion. Chronic right lower rib injuries. IMPRESSION:  Multiple solid bilateral pulmonary nodules, largest measuring 5 mm, which could represent metastatic disease. No suspicious mass to suggest a primary pulmonary malignancy. Mild centrilobular emphysema.  Bibasilar subsegmental atelectasis. Diffuse hypodense liver lesions and abdominal ascites as seen on recent CT of the abdomen and pelvis, concerning for metastatic disease. Electronically Signed   By: Maurine Simmering M.D.   On: 10/08/2021 15:20   MR LIVER W WO CONTRAST  Result Date: 10/09/2021 CLINICAL DATA:  Evaluation of hepatic lesions, hepatocellular carcinoma. EXAM: MRI ABDOMEN WITHOUT AND WITH CONTRAST TECHNIQUE: Multiplanar multisequence MR imaging of the abdomen was performed both before and after the administration of intravenous contrast. CONTRAST:  71mL GADAVIST GADOBUTROL 1 MMOL/ML IV SOLN COMPARISON:  CT abdomen pelvis October 07, 2021 and chest CT October 08, 2021. FINDINGS: Despite efforts by the technologist and patient, motion artifact is present on today's exam and could not be eliminated. This reduces exam sensitivity and specificity. Lower chest: No acute abnormality. Hepatobiliary: Liver is enlarged with innumerable bilobar hepatic lesions, predominantly these demonstrate mildly hyperintense T2 signal with a peripheral rim of enhancement. For reference. There is a lesion in the posteromedial aspect of the right lobe of the liver measuring 3.8 x 3.1 cm on image 36/17 and a lesion in the posteromedial aspect of the left lobe of the liver which measures 15 x 13 mm on image 38/32. Pericholecystic fluid without gallbladder wall thickening or cholelithiasis is favored sequela of underlying hepatocellular disease. No biliary ductal dilation. Pancreas: Pancreas appears atrophic and poorly evaluated without pancreatic ductal dilation. Spleen:  Within normal limits in size and appearance. Adrenals/Urinary Tract: The bilateral adrenal glands appear unremarkable. No hydronephrosis. No solid enhancing renal  mass. Stomach/Bowel: No acute abnormality on this limited evaluation. Vascular/Lymphatic: The portal, splenic and superior mesenteric veins appear patent. Abdominal adenopathy for instance a periportal lymph node measuring 16 mm in short axis on image 66/9. Other:  Large volume abdominal ascites. Musculoskeletal: No suspicious bone lesions identified. IMPRESSION: Despite efforts by the technologist and patient, motion artifact is present on today's exam and could not be eliminated. This reduces exam sensitivity and specificity. 1. Innumerable bilobar hepatic lesions, with imaging features most consistent with metastatic disease. Consider further evaluation with direct tissue sampling for more definitive characterization. 2. Abdominal adenopathy and large volume ascites, likely reflecting disease involvement. Electronically Signed   By: Dahlia Bailiff M.D.   On: 10/09/2021 19:35   US BIOPSY (LIVER)  Result Date: 10/12/2021 INDICATION: 66 year old with innumerable liver lesions. Findings are concerning for metastatic disease and tissue diagnosis is needed. Patient also has a large volume of ascites. EXAM: 1. Ultrasound-guided paracentesis 2. Ultrasound-guided liver lesion biopsy MEDICATIONS: Moderate sedation ANESTHESIA/SEDATION: Moderate (conscious) sedation was employed during this procedure. A total of Versed 2.0mg  and fentanyl 100 mcg was administered intravenously at the order of the provider performing the procedure. Total intra-service moderate sedation time: 26 minutes. Patient's level of consciousness and vital signs were monitored continuously by radiology nurse throughout the procedure under the supervision of the provider performing the procedure. FLUOROSCOPY TIME:  None COMPLICATIONS: None immediate. PROCEDURE: Informed written consent was obtained from the patient after a thorough discussion of the procedural risks, benefits and alternatives. All questions were addressed. A timeout was performed  prior to the initiation of the procedure. The abdomen was evaluated  with ultrasound. Ascites was identified around the liver. Subtle lesions throughout the liver. Hyperechoic lesion in left hepatic lobe was targeted for biopsy. The anterior and right side of the abdomen was prepped with chlorhexidine and sterile field was created. Maximal barrier sterile technique was utilized including caps, mask, sterile gowns, sterile gloves, sterile drape, hand hygiene and skin antiseptic. Right lower abdomen was anesthetized with 1% lidocaine. A small incision was made. Using ultrasound guidance, a Safe-T-Centesis catheter was directed into perihepatic ascites. Paracentesis was performed. During the paracentesis, the left hepatic lobe was targeted for biopsy. The left upper abdomen was anesthetized with 1% lidocaine and a small incision was made. Using ultrasound guidance, 17 gauge coaxial needle was directed into the left hepatic lobe and directed into a hyperechoic lesion. Total of 4 core biopsies were obtained with an 18 gauge core device. Specimens placed in formalin. Gel-Foam slurry was injected through the 17 gauge needle as it was removed. Bandage placed at the biopsy site. Paracentesis catheter was removed. Bandage placed at the paracentesis site. FINDINGS: Liver is diffusely heterogeneous with poorly defined lesions. A hyperechoic lesion in left hepatic lobe was successfully biopsied. 1.9 L of amber colored ascites was removed. IMPRESSION: 1. Ultrasound-guided core biopsy of a left hepatic lesion. 2. Ultrasound-guided paracentesis.  1.9 L of fluid was removed. Electronically Signed   By: Markus Daft M.D.   On: 10/12/2021 16:00   US Paracentesis  Result Date: 10/19/2021 INDICATION: History of metastatic rectal cancer to the liver. Recurrent ascites. Request for therapeutic paracentesis. EXAM: ULTRASOUND GUIDED LEFT LOWER QUADRANT PARACENTESIS MEDICATIONS: 1% plain lidocaine, 5 mL COMPLICATIONS: None immediate.  PROCEDURE: Informed written consent was obtained from the patient after a discussion of the risks, benefits and alternatives to treatment. A timeout was performed prior to the initiation of the procedure. Initial ultrasound scanning demonstrates a large amount of ascites within the left lower abdominal quadrant. The left lower abdomen was prepped and draped in the usual sterile fashion. 1% lidocaine was used for local anesthesia. Following this, a 19 gauge, 7-cm, Yueh catheter was introduced. An ultrasound image was saved for documentation purposes. The paracentesis was performed. The catheter was removed and a dressing was applied. The patient tolerated the procedure well without immediate post procedural complication. FINDINGS: A total of approximately 2.8 L of clear yellow fluid was removed. IMPRESSION: Successful ultrasound-guided paracentesis yielding 2.8 liters of peritoneal fluid. Read by: Ascencion Dike PA-C Electronically Signed   By: Markus Daft M.D.   On: 10/19/2021 12:45   US Paracentesis  Result Date: 10/12/2021 INDICATION: 66 year old with innumerable liver lesions. Findings are concerning for metastatic disease and tissue diagnosis is needed. Patient also has a large volume of ascites. EXAM: 1. Ultrasound-guided paracentesis 2. Ultrasound-guided liver lesion biopsy MEDICATIONS: Moderate sedation ANESTHESIA/SEDATION: Moderate (conscious) sedation was employed during this procedure. A total of Versed 2.0mg  and fentanyl 100 mcg was administered intravenously at the order of the provider performing the procedure. Total intra-service moderate sedation time: 26 minutes. Patient's level of consciousness and vital signs were monitored continuously by radiology nurse throughout the procedure under the supervision of the provider performing the procedure. FLUOROSCOPY TIME:  None COMPLICATIONS: None immediate. PROCEDURE: Informed written consent was obtained from the patient after a thorough discussion of the  procedural risks, benefits and alternatives. All questions were addressed. A timeout was performed prior to the initiation of the procedure. The abdomen was evaluated with ultrasound. Ascites was identified around the liver. Subtle lesions throughout the liver. Hyperechoic lesion in  left hepatic lobe was targeted for biopsy. The anterior and right side of the abdomen was prepped with chlorhexidine and sterile field was created. Maximal barrier sterile technique was utilized including caps, mask, sterile gowns, sterile gloves, sterile drape, hand hygiene and skin antiseptic. Right lower abdomen was anesthetized with 1% lidocaine. A small incision was made. Using ultrasound guidance, a Safe-T-Centesis catheter was directed into perihepatic ascites. Paracentesis was performed. During the paracentesis, the left hepatic lobe was targeted for biopsy. The left upper abdomen was anesthetized with 1% lidocaine and a small incision was made. Using ultrasound guidance, 17 gauge coaxial needle was directed into the left hepatic lobe and directed into a hyperechoic lesion. Total of 4 core biopsies were obtained with an 18 gauge core device. Specimens placed in formalin. Gel-Foam slurry was injected through the 17 gauge needle as it was removed. Bandage placed at the biopsy site. Paracentesis catheter was removed. Bandage placed at the paracentesis site. FINDINGS: Liver is diffusely heterogeneous with poorly defined lesions. A hyperechoic lesion in left hepatic lobe was successfully biopsied. 1.9 L of amber colored ascites was removed. IMPRESSION: 1. Ultrasound-guided core biopsy of a left hepatic lesion. 2. Ultrasound-guided paracentesis.  1.9 L of fluid was removed. Electronically Signed   By: Markus Daft M.D.   On: 10/12/2021 16:00   US Paracentesis  Result Date: 10/08/2021 INDICATION: Patient with history of asthma, ADHD, COPD, weight loss, recent imaging revealing diffuse hepatic metastatic disease, ascites, right lower  lobe pulmonary nodule. Request received for diagnostic and therapeutic paracentesis. EXAM: ULTRASOUND GUIDED DIAGNOSTIC AND THERAPEUTIC PARACENTESIS MEDICATIONS: 10 mL 1% lidocaine COMPLICATIONS: None immediate. PROCEDURE: Informed written consent was obtained from the patient after a discussion of the risks, benefits and alternatives to treatment. A timeout was performed prior to the initiation of the procedure. Initial ultrasound scanning demonstrates a small amount of ascites within the right lower abdominal quadrant. The right lower abdomen was prepped and draped in the usual sterile fashion. 1% lidocaine was used for local anesthesia. Following this, a 19 gauge, 7-cm, Yueh catheter was introduced. An ultrasound image was saved for documentation purposes. The paracentesis was performed. The catheter was removed and a dressing was applied. The patient tolerated the procedure well without immediate post procedural complication. FINDINGS: A total of approximately 1.6 liters of clear, yellow fluid was removed. Samples were sent to the laboratory as requested by the clinical team. IMPRESSION: Successful ultrasound-guided diagnostic and therapeutic paracentesis yielding 1.6 liters of peritoneal fluid. Read by: Rowe Robert, PA-C Electronically Signed   By: Michaelle Birks M.D.   On: 10/08/2021 17:03   DG Chest Port 1 View  Result Date: 10/11/2021 CLINICAL DATA:  66 year old male with arrhythmia, leukocytosis. EXAM: PORTABLE CHEST 1 VIEW COMPARISON:  Chest CT 10/08/2021 and earlier. FINDINGS: Portable AP semi upright view at 0709 hours. Mildly lower lung volumes with evidence of chronic pulmonary hyperinflation on the recent CT demonstrating emphysema. Mediastinal contours remain within normal limits. Calcified aortic atherosclerosis. Visualized tracheal air column is within normal limits. Allowing for portable technique the lungs are clear. No pneumothorax or pleural effusion. Mild chronic lung base scarring.  Paucity of bowel gas in the upper abdomen. No acute osseous abnormality identified. IMPRESSION: Emphysema (ICD10-J43.9). No acute cardiopulmonary abnormality. Electronically Signed   By: Genevie Ann M.D.   On: 10/11/2021 07:34   ECHOCARDIOGRAM COMPLETE  Result Date: 10/08/2021    ECHOCARDIOGRAM REPORT   Patient Name:   Jason Mcguire Date of Exam: 10/08/2021 Medical Rec #:  [id number]  Height:       66.0 in Accession #:    [id number]        Weight:       118.2 lb Date of Birth:  07/05/1956         BSA:          1.599 m Patient Age:    27 years          BP:           156/72 mmHg Patient Gender: M                 HR:           73 bpm. Exam Location:  Inpatient Procedure: 2D Echo, 3D Echo, Cardiac Doppler, Color Doppler and Strain Analysis Indications:    Elevated Troponin  History:        Patient has no prior history of Echocardiogram examinations.                 Signs/Symptoms:Shortness of Breath. Bilateral lower extremity                 edema.  Sonographer:    Darlina Sicilian RDCS Referring Phys: [place]  1. Left ventricular ejection fraction, by estimation, is 60 to 65%. Left ventricular ejection fraction by 3D volume is 62 %. The left ventricle has normal function. The left ventricle has no regional wall motion abnormalities. There is mild concentric left ventricular hypertrophy. Left ventricular diastolic parameters are consistent with Grade I diastolic dysfunction (impaired relaxation).  2. Right ventricular systolic function is normal. The right ventricular size is normal.  3. The mitral valve is normal in structure. No evidence of mitral valve regurgitation. No evidence of mitral stenosis. Moderate mitral annular calcification.  4. The aortic valve is tricuspid. There is mild calcification of the aortic valve. There is mild thickening of the aortic valve. Aortic valve regurgitation is trivial. Aortic valve sclerosis is present, with no evidence of aortic valve stenosis.   5. The inferior vena cava is normal in size with greater than 50% respiratory variability, suggesting right atrial pressure of 3 mmHg. FINDINGS  Left Ventricle: Left ventricular ejection fraction, by estimation, is 60 to 65%. Left ventricular ejection fraction by 3D volume is 62 %. The left ventricle has normal function. The left ventricle has no regional wall motion abnormalities. Global longitudinal strain performed but not reported based on interpreter judgement due to suboptimal tracking. The left ventricular internal cavity size was normal in size. There is mild concentric left ventricular hypertrophy. Left ventricular diastolic parameters are consistent with Grade I diastolic dysfunction (impaired relaxation). Normal left ventricular filling pressure. Right Ventricle: The right ventricular size is normal. No increase in right ventricular wall thickness. Right ventricular systolic function is normal. Left Atrium: Left atrial size was normal in size. Right Atrium: Right atrial size was normal in size. Pericardium: There is no evidence of pericardial effusion. Mitral Valve: The mitral valve is normal in structure. Moderate mitral annular calcification. No evidence of mitral valve regurgitation. No evidence of mitral valve stenosis. Tricuspid Valve: The tricuspid valve is normal in structure. Tricuspid valve regurgitation is not demonstrated. No evidence of tricuspid stenosis. Aortic Valve: The aortic valve is tricuspid. There is mild calcification of the aortic valve. There is mild thickening of the aortic valve. Aortic valve regurgitation is trivial. Aortic regurgitation PHT measures 411 msec. Aortic valve sclerosis is present, with no evidence of aortic valve stenosis. Pulmonic Valve: The pulmonic valve  was normal in structure. Pulmonic valve regurgitation is not visualized. No evidence of pulmonic stenosis. Aorta: The aortic root is normal in size and structure. Venous: The inferior vena cava is normal in size  with greater than 50% respiratory variability, suggesting right atrial pressure of 3 mmHg. IAS/Shunts: No atrial level shunt detected by color flow Doppler.  LEFT VENTRICLE PLAX 2D LVIDd:         3.80 cm         Diastology LVIDs:         2.30 cm         LV e' medial:    5.33 cm/s LV PW:         1.20 cm         LV E/e' medial:  8.7 LV IVS:        1.20 cm         LV e' lateral:   7.51 cm/s LVOT diam:     1.90 cm         LV E/e' lateral: 6.2 LV SV:         51 LV SV Index:   32 LVOT Area:     2.84 cm        3D Volume EF                                LV 3D EF:    Left                                             ventricul                                             ar                                             ejection                                             fraction                                             by 3D                                             volume is                                             62 %.  3D Volume EF:                                3D EF:        62 %                                LV EDV:       111 ml                                LV ESV:       42 ml                                LV SV:        69 ml RIGHT VENTRICLE RV S prime:     14.50 cm/s TAPSE (M-mode): 2.2 cm LEFT ATRIUM             Index        RIGHT ATRIUM          Index LA diam:        3.30 cm 2.06 cm/m   RA Area:     8.60 cm LA Vol (A2C):   59.0 ml 36.87 ml/m  RA Volume:   13.50 ml 8.44 ml/m LA Vol (A4C):   39.6 ml 24.76 ml/m LA Biplane Vol: 46.9 ml 29.33 ml/m  AORTIC VALVE LVOT Vmax:   85.70 cm/s LVOT Vmean:  46.500 cm/s LVOT VTI:    0.181 m AI PHT:      411 msec  AORTA Ao Root diam: 3.30 cm Ao Asc diam:  3.00 cm MITRAL VALVE MV Area (PHT): 1.85 cm    SHUNTS MV Decel Time: 409 msec    Systemic VTI:  0.18 m MV E velocity: 46.60 cm/s  Systemic Diam: 1.90 cm MV A velocity: 91.70 cm/s MV E/A ratio:  0.51 Mihai Croitoru MD Electronically signed by Sanda Klein MD Signature Date/Time:  10/08/2021/3:24:42 PM    Final    IR IMAGING GUIDED PORT INSERTION  Result Date: 10/20/2021 CLINICAL DATA:  Metastatic colorectal carcinoma, needs durable venous access for planned treatment regimen EXAM: TUNNELED PORT CATHETER PLACEMENT WITH ULTRASOUND AND FLUOROSCOPIC GUIDANCE FLUOROSCOPY TIME:  30 seconds; 1 mGy ANESTHESIA/SEDATION: Intravenous Fentanyl 180mcg and Versed 2mg  were administered as conscious sedation during continuous monitoring of the patient's level of consciousness and physiological / cardiorespiratory status by the radiology RN, with a total moderate sedation time of 14 minutes. TECHNIQUE: The procedure, risks, benefits, and alternatives were explained to the patient. Questions regarding the procedure were encouraged and answered. The patient understands and consents to the procedure. Patency of the right IJ vein was confirmed with ultrasound with image documentation. An appropriate skin site was determined. Skin site was marked. Region was prepped using maximum barrier technique including cap and mask, sterile gown, sterile gloves, large sterile sheet, and Chlorhexidine as cutaneous antisepsis. The region was infiltrated locally with 1% lidocaine. Under real-time ultrasound guidance, the right IJ vein was accessed with a 21 gauge micropuncture needle; the needle tip within the vein was confirmed with ultrasound image documentation. Needle was exchanged over a 018 guidewire for transitional dilator, and vascular measurement was performed. A small incision was made on the right anterior chest wall and a  subcutaneous pocket fashioned. The power-injectable port was positioned and its catheter tunneled to the right IJ dermatotomy site. The transitional dilator was exchanged over an Amplatz wire for a peel-away sheath, through which the port catheter, which had been trimmed to the appropriate length, was advanced and positioned under fluoroscopy with its tip at the cavoatrial junction. Spot chest  radiograph confirms good catheter position and no pneumothorax. The port was flushed per protocol. The pocket was closed with deep interrupted and subcuticular continuous 3-0 Monocryl sutures. The incisions were covered with Dermabond then covered with a sterile dressing. The patient tolerated the procedure well. COMPLICATIONS: COMPLICATIONS None immediate IMPRESSION: Technically successful right IJ power-injectable port catheter placement. Ready for routine use. Electronically Signed   By: Lucrezia Europe M.D.   On: 10/20/2021 15:18    Microbiology: Recent Results (from the past 240 hour(s))  Resp Panel by RT-PCR (Flu A&B, Covid) Nasopharyngeal Swab     Status: None   Collection Time: 10/21/21  6:29 PM   Specimen: Nasopharyngeal Swab; Nasopharyngeal(NP) swabs in vial transport medium  Result Value Ref Range Status   SARS Coronavirus 2 by RT PCR NEGATIVE NEGATIVE Final    Comment: (NOTE) SARS-CoV-2 target nucleic acids are NOT DETECTED.  The SARS-CoV-2 RNA is generally detectable in upper respiratory specimens during the acute phase of infection. The lowest concentration of SARS-CoV-2 viral copies this assay can detect is 138 copies/mL. A negative result does not preclude SARS-Cov-2 infection and should not be used as the sole basis for treatment or other patient management decisions. A negative result may occur with  improper specimen collection/handling, submission of specimen other than nasopharyngeal swab, presence of viral mutation(s) within the areas targeted by this assay, and inadequate number of viral copies(<138 copies/mL). A negative result must be combined with clinical observations, patient history, and epidemiological information. The expected result is Negative.  Fact Sheet for Patients:  EntrepreneurPulse.com.au  Fact Sheet for Healthcare Providers:  IncredibleEmployment.be  This test is no t yet approved or cleared by the [place] FDA  and  has been authorized for detection and/or diagnosis of SARS-CoV-2 by FDA under an Emergency Use Authorization (EUA). This EUA will remain  in effect (meaning this test can be used) for the duration of the COVID-19 declaration under Section 564(b)(1) of the Act, 21 U.S.C.section 360bbb-3(b)(1), unless the authorization is terminated  or revoked sooner.       Influenza A by PCR NEGATIVE NEGATIVE Final   Influenza B by PCR NEGATIVE NEGATIVE Final    Comment: (NOTE) The Xpert Xpress SARS-CoV-2/FLU/RSV plus assay is intended as an aid in the diagnosis of influenza from Nasopharyngeal swab specimens and should not be used as a sole basis for treatment. Nasal washings and aspirates are unacceptable for Xpert Xpress SARS-CoV-2/FLU/RSV testing.  Fact Sheet for Patients: EntrepreneurPulse.com.au  Fact Sheet for Healthcare Providers: IncredibleEmployment.be  This test is not yet approved or cleared by the [place] FDA and has been authorized for detection and/or diagnosis of SARS-CoV-2 by FDA under an Emergency Use Authorization (EUA). This EUA will remain in effect (meaning this test can be used) for the duration of the COVID-19 declaration under Section 564(b)(1) of the Act, 21 U.S.C. section 360bbb-3(b)(1), unless the authorization is terminated or revoked.  Performed at [facility], [place] [address], [place], [place] [address]      Labs: Basic Metabolic Panel: Recent Labs  Lab 10/22/21 0954  NA 136  K 4.3  CL 95*  CO2 31  GLUCOSE 123*  BUN 19  CREATININE 0.48*  CALCIUM 8.4*    Liver Function Tests: Recent Labs  Lab 10/22/21 0954  AST 108*  ALT 41  ALKPHOS 519*  BILITOT 0.7  PROT 6.3*  ALBUMIN 2.2*    No results for input(s): LIPASE, AMYLASE in the last 168 hours. No results for input(s): AMMONIA in the last 168 hours. CBC: Recent Labs  Lab 10/18/21 0604 10/19/21 0452 10/20/21 0515  10/21/21 0543 10/22/21 0500  WBC 13.1* 11.8* 11.6* 11.5* 12.7*  NEUTROABS 11.6* 10.1* 9.5* 9.3* 10.9*  HGB 10.0* 10.0* 10.1* 9.3* 9.3*  HCT 32.9* 32.5* 34.3* 30.6* 31.9*  MCV 84.1 83.8 86.6 86.7 87.6  PLT 468* 448* 395 386 379    Cardiac Enzymes: No results for input(s): CKTOTAL, CKMB, CKMBINDEX, TROPONINI in the last 168 hours. BNP: BNP (last 3 results) Recent Labs    10/07/21 1819  BNP 351.8*     ProBNP (last 3 results) No results for input(s): PROBNP in the last 8760 hours.  CBG: No results for input(s): GLUCAP in the last 168 hours.     Signed:  Nita Sells MD   [facility] 10/23/2021, 9:45 AM

## 2021-10-23 NOTE — Progress Notes (Addendum)
2025 Pt's niece, Loree Fee came to room to take home some belongings. She took home food/drinks, bag of clothing, and paperwork.

## 2021-10-23 NOTE — Progress Notes (Signed)
Physical Therapy Treatment Patient Details Name: Jason Mcguire MRN: [id number] DOB: 04-23-1956 Today's Date: 10/23/2021   History of Present Illness Patient is a 66 year old male who presented to the hospital on 10/07/21 with shortness of breath and BLE edema. patient was found to have malignant neoplasm metastatic to liver, SOB, adbominal ascites, and dehydration. Patient underwent paracentesis on 12/23 removing 1.6L of fluid. Paracentesis and biopsy of liver on 12/27 with 1.9 L removed.  PMH: alcohol use, ADD, asthma, and COPD.    PT Comments    Pt making good progress. Demonstrated improved stability with RW.  He ambulated 400' with RW and supervision.  Pt is supervision with transfers.  Pt does have limited support at home. He reports he may have a roommate in a week.  Pt could likely return home with assist for IADLs, intermittent (daily) supervision, and rollator would be beneficial for pt to take rest breaks and to use for sitting with ADLs/IADLs for safety.     Recommendations for follow up therapy are one component of a multi-disciplinary discharge planning process, led by the attending physician.  Recommendations may be updated based on patient status, additional functional criteria and insurance authorization.  Follow Up Recommendations  Home health PT     Assistance Recommended at Discharge Intermittent Supervision/Assistance  Patient can return home with the following A little help with bathing/dressing/bathroom;Assistance with cooking/housework;Other (comment)   Equipment Recommendations  Rollator (4 wheels) (to allow for rest breaks or sitting during ADLs/IADLs)    Recommendations for Other Services       Precautions / Restrictions Precautions Precautions: Fall     Mobility  Bed Mobility Overal bed mobility: Modified Independent                  Transfers Overall transfer level: Needs assistance Equipment used: None Transfers: Sit to/from Stand Sit to  Stand: Supervision           General transfer comment: Performed x 2 without difficulty    Ambulation/Gait Ambulation/Gait assistance: Supervision Gait Distance (Feet): 400 Feet Assistive device: Rolling walker (2 wheels) Gait Pattern/deviations: Step-through pattern       General Gait Details: With RW pt demonstrated gait without LOB, did need min cues for RW proxmity with turns   Stairs Stairs: Yes Stairs assistance: Supervision Stair Management: One rail Right;Step to pattern;Forwards Number of Stairs: 5 General stair comments: performed 5 steps safely   Wheelchair Mobility    Modified Rankin (Stroke Patients Only)       Balance Overall balance assessment: Needs assistance Sitting-balance support: No upper extremity supported Sitting balance-Leahy Scale: Normal     Standing balance support: No upper extremity supported Standing balance-Leahy Scale: Fair Standing balance comment: Ambulates with RW; steps with R rail, stood in room without UE support while performing toileting ADLs, weight shifting, and talking on phone                            Cognition Arousal/Alertness: Awake/alert Behavior During Therapy: WFL for tasks assessed/performed Overall Cognitive Status: Within Functional Limits for tasks assessed                                 General Comments: Pt frequently needs redirecting and focusing on task at hand        Exercises      General Comments General comments (skin integrity, edema,  etc.): VSS; Pt reports potential of getting a roommate within a week who could assist with IADLs and offer some supervision; encouraged pt to think about options for support in the meantime if SNF is not approved by insurance as he is walking 400' with supervision using RW      Pertinent Vitals/Pain Pain Assessment: No/denies pain    Home Living                          Prior Function            PT Goals (current  goals can now be found in the care plan section) Progress towards PT goals: Progressing toward goals    Frequency    Min 3X/week      PT Plan Discharge plan needs to be updated    Co-evaluation              AM-PAC PT "6 Clicks" Mobility   Outcome Measure  Help needed turning from your back to your side while in a flat bed without using bedrails?: None Help needed moving from lying on your back to sitting on the side of a flat bed without using bedrails?: None Help needed moving to and from a bed to a chair (including a wheelchair)?: A Little Help needed standing up from a chair using your arms (e.g., wheelchair or bedside chair)?: A Little Help needed to walk in hospital room?: A Little Help needed climbing 3-5 steps with a railing? : A Little 6 Click Score: 20    End of Session   Activity Tolerance: Patient tolerated treatment well Patient left: in bed;with call bell/phone within reach;with bed alarm set Nurse Communication: Mobility status PT Visit Diagnosis: Difficulty in walking, not elsewhere classified (R26.2)     Time: 8921-1941 PT Time Calculation (min) (ACUTE ONLY): 26 min  Charges:  $Gait Training: 8-22 mins $Therapeutic Activity: 8-22 mins                     Abran Richard, PT Acute Rehab Services Pager [phone] [facility] 10/23/2021, 1:25 PM

## 2021-10-23 NOTE — TOC Progression Note (Signed)
Transition of Care [facility]) - Progression Note    Patient Details  Name: Jason Mcguire MRN: [id number] Date of Birth: 02-20-1956  Transition of Care [facility]) CM/SW Contact  Ross Ludwig,  Phone Number: 10/23/2021, 3:51 PM  Clinical Narrative:    CSW contacted [employer] to find out status of patient's insurance authorization.  CSW spoke to rep, and she said [employer] is requesting a peer to peer.  CSW updated attending physician, who spoke to Dr. Marlyn Corporal and per attending physician, Dr. Clyde Canterbury from [place] approved patient for SNF placement at [facility].  CSW spoke to Howe at [facility] and she said patient can arrive today once the official authorization number has been updated.  CSW called [facility], and they are supposed to call CSW with approval information.   Expected Discharge Plan: Skilled Nursing Facility Barriers to Discharge: Continued Medical Work up  Expected Discharge Plan and Services Expected Discharge Plan: [place]   Discharge Planning Services: CM Consult   Living arrangements for the past 2 months: Single Family Home Expected Discharge Date: 10/22/21                                     Social Determinants of Health (SDOH) Interventions    Readmission Risk Interventions Readmission Risk Prevention Plan 10/21/2021 10/19/2021  Transportation Screening Complete Complete  PCP or Specialist Appt within 5-7 Days Complete Complete  Home Care Screening Complete Complete  Medication Review (RN CM) Complete Complete  Some recent data might be hidden

## 2021-10-24 DIAGNOSIS — K5900 Constipation, unspecified: Secondary | ICD-10-CM | POA: Diagnosis not present

## 2021-10-24 DIAGNOSIS — Z7401 Bed confinement status: Secondary | ICD-10-CM | POA: Diagnosis not present

## 2021-10-24 DIAGNOSIS — R180 Malignant ascites: Secondary | ICD-10-CM | POA: Diagnosis not present

## 2021-10-24 DIAGNOSIS — C786 Secondary malignant neoplasm of retroperitoneum and peritoneum: Secondary | ICD-10-CM | POA: Diagnosis not present

## 2021-10-24 DIAGNOSIS — C787 Secondary malignant neoplasm of liver and intrahepatic bile duct: Secondary | ICD-10-CM | POA: Diagnosis not present

## 2021-10-24 DIAGNOSIS — G893 Neoplasm related pain (acute) (chronic): Secondary | ICD-10-CM | POA: Diagnosis not present

## 2021-10-24 DIAGNOSIS — E860 Dehydration: Secondary | ICD-10-CM | POA: Diagnosis not present

## 2021-10-24 DIAGNOSIS — Z1159 Encounter for screening for other viral diseases: Secondary | ICD-10-CM | POA: Diagnosis not present

## 2021-10-24 DIAGNOSIS — D508 Other iron deficiency anemias: Secondary | ICD-10-CM | POA: Diagnosis not present

## 2021-10-24 DIAGNOSIS — E871 Hypo-osmolality and hyponatremia: Secondary | ICD-10-CM | POA: Diagnosis not present

## 2021-10-24 DIAGNOSIS — Z743 Need for continuous supervision: Secondary | ICD-10-CM | POA: Diagnosis not present

## 2021-10-24 DIAGNOSIS — Z79899 Other long term (current) drug therapy: Secondary | ICD-10-CM | POA: Diagnosis not present

## 2021-10-24 DIAGNOSIS — E43 Unspecified severe protein-calorie malnutrition: Secondary | ICD-10-CM | POA: Diagnosis not present

## 2021-10-24 DIAGNOSIS — D72829 Elevated white blood cell count, unspecified: Secondary | ICD-10-CM | POA: Diagnosis not present

## 2021-10-24 DIAGNOSIS — R0602 Shortness of breath: Secondary | ICD-10-CM | POA: Diagnosis not present

## 2021-10-24 DIAGNOSIS — E46 Unspecified protein-calorie malnutrition: Secondary | ICD-10-CM | POA: Diagnosis not present

## 2021-10-24 DIAGNOSIS — F17210 Nicotine dependence, cigarettes, uncomplicated: Secondary | ICD-10-CM | POA: Diagnosis not present

## 2021-10-24 DIAGNOSIS — J310 Chronic rhinitis: Secondary | ICD-10-CM | POA: Diagnosis not present

## 2021-10-24 DIAGNOSIS — R770 Abnormality of albumin: Secondary | ICD-10-CM | POA: Diagnosis not present

## 2021-10-24 DIAGNOSIS — Z515 Encounter for palliative care: Secondary | ICD-10-CM | POA: Diagnosis not present

## 2021-10-24 DIAGNOSIS — R531 Weakness: Secondary | ICD-10-CM | POA: Diagnosis not present

## 2021-10-24 DIAGNOSIS — R530 Neoplastic (malignant) related fatigue: Secondary | ICD-10-CM | POA: Diagnosis not present

## 2021-10-24 DIAGNOSIS — R634 Abnormal weight loss: Secondary | ICD-10-CM | POA: Diagnosis not present

## 2021-10-24 DIAGNOSIS — R5383 Other fatigue: Secondary | ICD-10-CM | POA: Diagnosis not present

## 2021-10-24 DIAGNOSIS — R6889 Other general symptoms and signs: Secondary | ICD-10-CM | POA: Diagnosis not present

## 2021-10-24 DIAGNOSIS — J432 Centrilobular emphysema: Secondary | ICD-10-CM | POA: Diagnosis not present

## 2021-10-24 DIAGNOSIS — J449 Chronic obstructive pulmonary disease, unspecified: Secondary | ICD-10-CM | POA: Diagnosis not present

## 2021-10-24 DIAGNOSIS — C20 Malignant neoplasm of rectum: Secondary | ICD-10-CM | POA: Diagnosis not present

## 2021-10-24 DIAGNOSIS — R188 Other ascites: Secondary | ICD-10-CM | POA: Diagnosis not present

## 2021-10-24 DIAGNOSIS — K625 Hemorrhage of anus and rectum: Secondary | ICD-10-CM | POA: Diagnosis not present

## 2021-10-24 DIAGNOSIS — R918 Other nonspecific abnormal finding of lung field: Secondary | ICD-10-CM | POA: Diagnosis not present

## 2021-10-24 DIAGNOSIS — M6281 Muscle weakness (generalized): Secondary | ICD-10-CM | POA: Diagnosis not present

## 2021-10-24 DIAGNOSIS — K648 Other hemorrhoids: Secondary | ICD-10-CM | POA: Diagnosis not present

## 2021-10-24 DIAGNOSIS — Z7189 Other specified counseling: Secondary | ICD-10-CM | POA: Diagnosis not present

## 2021-10-24 DIAGNOSIS — R946 Abnormal results of thyroid function studies: Secondary | ICD-10-CM | POA: Diagnosis not present

## 2021-10-24 DIAGNOSIS — D649 Anemia, unspecified: Secondary | ICD-10-CM | POA: Diagnosis not present

## 2021-10-25 ENCOUNTER — Telehealth: Payer: Self-pay | Admitting: Dietician

## 2021-10-25 DIAGNOSIS — M6281 Muscle weakness (generalized): Secondary | ICD-10-CM | POA: Diagnosis not present

## 2021-10-25 DIAGNOSIS — E43 Unspecified severe protein-calorie malnutrition: Secondary | ICD-10-CM | POA: Diagnosis not present

## 2021-10-25 DIAGNOSIS — R5383 Other fatigue: Secondary | ICD-10-CM | POA: Diagnosis not present

## 2021-10-25 DIAGNOSIS — R946 Abnormal results of thyroid function studies: Secondary | ICD-10-CM | POA: Diagnosis not present

## 2021-10-25 DIAGNOSIS — C787 Secondary malignant neoplasm of liver and intrahepatic bile duct: Secondary | ICD-10-CM | POA: Diagnosis not present

## 2021-10-25 DIAGNOSIS — C20 Malignant neoplasm of rectum: Secondary | ICD-10-CM | POA: Diagnosis not present

## 2021-10-25 DIAGNOSIS — G893 Neoplasm related pain (acute) (chronic): Secondary | ICD-10-CM | POA: Diagnosis not present

## 2021-10-25 NOTE — Telephone Encounter (Signed)
Scheduled appt per 1/5 referral. Pt is aware of appt date and time.  °

## 2021-10-26 ENCOUNTER — Inpatient Hospital Stay: Payer: Medicare Other | Attending: Hematology | Admitting: Dietician

## 2021-10-26 DIAGNOSIS — K5900 Constipation, unspecified: Secondary | ICD-10-CM | POA: Insufficient documentation

## 2021-10-26 DIAGNOSIS — C786 Secondary malignant neoplasm of retroperitoneum and peritoneum: Secondary | ICD-10-CM | POA: Insufficient documentation

## 2021-10-26 DIAGNOSIS — R180 Malignant ascites: Secondary | ICD-10-CM | POA: Insufficient documentation

## 2021-10-26 DIAGNOSIS — Z79899 Other long term (current) drug therapy: Secondary | ICD-10-CM | POA: Insufficient documentation

## 2021-10-26 DIAGNOSIS — R5383 Other fatigue: Secondary | ICD-10-CM | POA: Diagnosis not present

## 2021-10-26 DIAGNOSIS — C787 Secondary malignant neoplasm of liver and intrahepatic bile duct: Secondary | ICD-10-CM | POA: Insufficient documentation

## 2021-10-26 DIAGNOSIS — G893 Neoplasm related pain (acute) (chronic): Secondary | ICD-10-CM | POA: Diagnosis not present

## 2021-10-26 DIAGNOSIS — J449 Chronic obstructive pulmonary disease, unspecified: Secondary | ICD-10-CM | POA: Insufficient documentation

## 2021-10-26 DIAGNOSIS — M6281 Muscle weakness (generalized): Secondary | ICD-10-CM | POA: Diagnosis not present

## 2021-10-26 DIAGNOSIS — C20 Malignant neoplasm of rectum: Secondary | ICD-10-CM | POA: Diagnosis not present

## 2021-10-26 DIAGNOSIS — F17210 Nicotine dependence, cigarettes, uncomplicated: Secondary | ICD-10-CM | POA: Insufficient documentation

## 2021-10-26 DIAGNOSIS — E46 Unspecified protein-calorie malnutrition: Secondary | ICD-10-CM | POA: Insufficient documentation

## 2021-10-26 DIAGNOSIS — R0602 Shortness of breath: Secondary | ICD-10-CM | POA: Diagnosis not present

## 2021-10-26 DIAGNOSIS — D649 Anemia, unspecified: Secondary | ICD-10-CM | POA: Insufficient documentation

## 2021-10-26 NOTE — Progress Notes (Signed)
Patient did not show for nutrition appointment. Message sent to scheduling with request to offer patient another appointment.

## 2021-10-27 ENCOUNTER — Telehealth: Payer: Self-pay | Admitting: Hematology

## 2021-10-27 ENCOUNTER — Other Ambulatory Visit: Payer: Self-pay

## 2021-10-27 DIAGNOSIS — C787 Secondary malignant neoplasm of liver and intrahepatic bile duct: Secondary | ICD-10-CM

## 2021-10-27 DIAGNOSIS — C801 Malignant (primary) neoplasm, unspecified: Secondary | ICD-10-CM

## 2021-10-27 NOTE — Telephone Encounter (Signed)
Attempted to r/s per 1/10 inbasket msg, unable to leave msg. Asked [facility] ED to ask him about nutrition appt during PAT EDU ON 1/12.

## 2021-10-28 ENCOUNTER — Inpatient Hospital Stay: Payer: Medicare Other

## 2021-10-28 ENCOUNTER — Other Ambulatory Visit: Payer: Medicare Other

## 2021-10-28 ENCOUNTER — Inpatient Hospital Stay (HOSPITAL_BASED_OUTPATIENT_CLINIC_OR_DEPARTMENT_OTHER): Payer: Medicare Other | Admitting: Hematology

## 2021-10-28 ENCOUNTER — Other Ambulatory Visit: Payer: Self-pay

## 2021-10-28 ENCOUNTER — Ambulatory Visit (HOSPITAL_COMMUNITY)
Admission: RE | Admit: 2021-10-28 | Discharge: 2021-10-28 | Disposition: A | Discharge status: Home / Self Care | Payer: Medicare Other | Source: Ambulatory Visit | Attending: Hematology | Admitting: Hematology

## 2021-10-28 VITALS — BP 128/80 | HR 97 | Temp 97.6°F | Resp 17 | Ht 66.0 in | Wt 123.0 lb

## 2021-10-28 DIAGNOSIS — C801 Malignant (primary) neoplasm, unspecified: Secondary | ICD-10-CM

## 2021-10-28 DIAGNOSIS — R188 Other ascites: Secondary | ICD-10-CM | POA: Diagnosis not present

## 2021-10-28 DIAGNOSIS — C787 Secondary malignant neoplasm of liver and intrahepatic bile duct: Secondary | ICD-10-CM | POA: Diagnosis not present

## 2021-10-28 DIAGNOSIS — R531 Weakness: Secondary | ICD-10-CM | POA: Diagnosis not present

## 2021-10-28 DIAGNOSIS — C20 Malignant neoplasm of rectum: Secondary | ICD-10-CM | POA: Diagnosis not present

## 2021-10-28 DIAGNOSIS — R180 Malignant ascites: Secondary | ICD-10-CM | POA: Diagnosis not present

## 2021-10-28 DIAGNOSIS — Z95828 Presence of other vascular implants and grafts: Secondary | ICD-10-CM

## 2021-10-28 HISTORY — PX: IR PARACENTESIS: IMG2679

## 2021-10-28 LAB — CMP (CANCER CENTER ONLY)
ALT: 62 U/L — ABNORMAL HIGH (ref 0–44)
AST: 198 U/L (ref 15–41)
Albumin: 2.5 g/dL — ABNORMAL LOW (ref 3.5–5.0)
Alkaline Phosphatase: 571 U/L — ABNORMAL HIGH (ref 38–126)
Anion gap: 6 (ref 5–15)
BUN: 13 mg/dL (ref 8–23)
CO2: 33 mmol/L — ABNORMAL HIGH (ref 22–32)
Calcium: 8.1 mg/dL — ABNORMAL LOW (ref 8.9–10.3)
Chloride: 95 mmol/L — ABNORMAL LOW (ref 98–111)
Creatinine: 0.46 mg/dL — ABNORMAL LOW (ref 0.61–1.24)
GFR, Estimated: >60 mL/min (ref >60)
Glucose, Bld: 103 mg/dL — ABNORMAL HIGH (ref 70–99)
Potassium: 3.8 mmol/L (ref 3.5–5.1)
Sodium: 134 mmol/L — ABNORMAL LOW (ref 135–145)
Total Bilirubin: 0.7 mg/dL (ref 0.3–1.2)
Total Protein: 6.1 g/dL — ABNORMAL LOW (ref 6.5–8.1)

## 2021-10-28 LAB — CBC WITH DIFFERENTIAL (CANCER CENTER ONLY)
Abs Immature Granulocytes: 0.1 10*3/uL — ABNORMAL HIGH (ref 0.00–0.07)
Basophils Absolute: 0 10*3/uL (ref 0.0–0.1)
Basophils Relative: 0 %
Eosinophils Absolute: 0.1 10*3/uL (ref 0.0–0.5)
Eosinophils Relative: 0 %
HCT: 34.3 % — ABNORMAL LOW (ref 39.0–52.0)
Hemoglobin: 10.5 g/dL — ABNORMAL LOW (ref 13.0–17.0)
Immature Granulocytes: 1 %
Lymphocytes Relative: 6 %
Lymphs Abs: 1 10*3/uL (ref 0.7–4.0)
MCH: 25.7 pg — ABNORMAL LOW (ref 26.0–34.0)
MCHC: 30.6 g/dL (ref 30.0–36.0)
MCV: 84.1 fL (ref 80.0–100.0)
Monocytes Absolute: 1 10*3/uL (ref 0.1–1.0)
Monocytes Relative: 6 %
Neutro Abs: 14.5 10*3/uL — ABNORMAL HIGH (ref 1.7–7.7)
Neutrophils Relative %: 87 %
Platelet Count: 509 10*3/uL — ABNORMAL HIGH (ref 150–400)
RBC: 4.08 MIL/uL — ABNORMAL LOW (ref 4.22–5.81)
RDW: 20.3 % — ABNORMAL HIGH (ref 11.5–15.5)
WBC Count: 16.7 10*3/uL — ABNORMAL HIGH (ref 4.0–10.5)
nRBC: 0 % (ref 0.0–0.2)

## 2021-10-28 MED ORDER — SODIUM CHLORIDE 0.9% FLUSH
10.0000 mL | INTRAVENOUS | Status: DC | PRN
  Administered 2021-10-28: 10 mL via INTRAVENOUS

## 2021-10-28 MED ORDER — HEPARIN SOD (PORK) LOCK FLUSH 100 UNIT/ML IV SOLN
500.0000 [IU] | Freq: Once | INTRAVENOUS | Status: AC
  Administered 2021-10-28: 500 [IU] via INTRAVENOUS

## 2021-10-28 MED ORDER — ONDANSETRON HCL 8 MG PO TABS: 8.0000 mg | ORAL_TABLET | Freq: Two times a day (BID) | ORAL | 1 refills | Status: DC | PRN

## 2021-10-28 MED ORDER — PROCHLORPERAZINE MALEATE 10 MG PO TABS: 10.0000 mg | ORAL_TABLET | Freq: Four times a day (QID) | ORAL | 1 refills | Status: DC | PRN

## 2021-10-28 MED ORDER — LIDOCAINE-PRILOCAINE 2.5-2.5 % EX CREA: TOPICAL_CREAM | CUTANEOUS | 3 refills | Status: DC

## 2021-10-28 MED ORDER — OXYCODONE HCL 5 MG PO TABS
20.0000 mg | ORAL_TABLET | Freq: Once | ORAL | Status: AC
  Administered 2021-10-28: 20 mg via ORAL
  Filled 2021-10-28: qty 4

## 2021-10-28 MED ORDER — OXYCODONE HCL 5 MG PO TABS
20.0000 mg | ORAL_TABLET | Freq: Once | ORAL | Status: AC
  Administered 2021-10-28: 20 mg via ORAL

## 2021-10-28 MED ORDER — LIDOCAINE HCL 1 % IJ SOLN
INTRAMUSCULAR | Status: AC
  Filled 2021-10-28: qty 20

## 2021-10-28 MED ORDER — LIDOCAINE HCL 1 % IJ SOLN
INTRAMUSCULAR | Status: DC | PRN
  Administered 2021-10-28: 5 mL via INTRADERMAL

## 2021-10-28 NOTE — Progress Notes (Signed)
START ON PATHWAY REGIMEN - Colorectal     A cycle is every 14 days:     Bevacizumab-xxxx      Oxaliplatin      Leucovorin      Fluorouracil      Fluorouracil   **Always confirm dose/schedule in your pharmacy ordering system**  Patient Characteristics: Distant Metastases, Nonsurgical Candidate, KRAS/NRAS Mutation Positive/Unknown (BRAF V600 Wild-Type/Unknown), Standard Cytotoxic Therapy, First Line Standard Cytotoxic Therapy, Bevacizumab Eligible, PS = 0,1 Tumor Location: Rectal Therapeutic Status: Distant Metastases Microsatellite/Mismatch Repair Status: MSS/pMMR BRAF Mutation Status: Awaiting Test Results KRAS/NRAS Mutation Status: Awaiting Test Results Standard Cytotoxic Line of Therapy: First Line Standard Cytotoxic Therapy ECOG Performance Status: 1 Bevacizumab Eligibility: Eligible Intent of Therapy: Non-Curative / Palliative Intent, Discussed with Patient
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
## 2021-10-28 NOTE — Procedures (Signed)
PROCEDURE SUMMARY:  Successful US guided therapeutic paracentesis from [facility].  Yielded 2.25 L of clear, yellow fluid.  No immediate complications.  Pt tolerated well.   Specimen not sent for labs.  EBL < 1 mL  Tyson Alias, AGNP 10/28/2021 1:38 PM

## 2021-10-28 NOTE — Patient Instructions (Signed)
Pain Medicine Instructions You may need pain medicine after an injury or illness. Two common types of pain medicine are: Non-opioid pain medicine. This includes NSAIDs. Opioid pain medicine. These may be called opioids. Pain medicine may not make all of your pain go away. It should make you comfortable enough to move, breathe, and do normal activities. How can pain medicines affect me? Pain medicines can cause side effects such as: Vomiting or feeling like you may vomit. Belly pain. Opioids can cause other side effects, such as: Trouble pooping (constipation). Feeling very sleepy. Confusion. Trouble breathing. Addiction to opioids. This means that you will take the medicine even though it hurts your health. Taking opioids for longer than 3 days raises your risk of these side effects. Taking opioids for a long time can affect how well you can do daily tasks. It also puts you at risk for: Car crashes. Depression. Suicide. Heart attack. If you do not take pain medicines correctly, you may be at risk for: Liver problems. Kidney problems. Taking too much of the medicine (overdose). This can lead to death. What actions can I take to lower my risk of problems? Know your treatment plan Talk about your treatment plan with your doctor. Both you and your doctor should agree on how you should be treated. Talk about the goals of your treatment, including: How much pain you might expect to have. How you will manage the pain. Ask your doctor if you can see other doctors who can treat your pain without using medicine. This can include physical therapy and counseling. Talk about the risks and benefits of taking these medicines for your condition. Tell your doctor about the amount of medicines you take and about any use of drugs or alcohol. Get your pain medicine prescriptions from only one doctor. Keep all follow-up visits. Take your medicine as told  Take pain medicine exactly as told by your  doctor. Take it only when you need it. If your pain is not too bad, you may take less medicine if your doctor allows. If you have no pain, do not take the medicine unless your doctor tells you to take it. If your pain is very bad, do not take more medicine than your doctor tells you to take. Call your doctor to know what to do. If your pain medicine has acetaminophen in it, do not take any other acetaminophen while you are taking this medicine. Too much can damage the liver. Write down the times when you take your pain medicine. Look at the times before you take your next dose. Take other over-the-counter or prescription medicines only as told by your doctor. Avoid certain activities While you are taking prescription pain medicine, and for 8 hours after your last dose: Do not drive. Do not use machinery. Do not use power tools. Do not sign legal documents. Do not drink alcohol. Do not take sleeping pills. Do not take care of children by yourself. Do not do any activities that involve climbing or being in high places. Do not go to a lake, river, ocean, spa, or swimming pool unless an adult is nearby who can monitor and help you.  Keep pets and people safe Store your medicine as told by your doctor. Keep it where children and pets cannot reach it. Do not share your pain medicine with anyone. Do not save unused pills. If you have unused pills, you can: Bring them to a take-back program. Bring them to a pharmacy that takes back unused  pills. Throw them in the trash. Check the medicine label or package insert to see if it is safe to throw it out. If it is safe, take the medicine out of the container. Mix it with something that makes it unusable, such as pet waste. Then put the medicine in the trash. Flush them down the toilet only if this is safe to do. To find out: Check the label or package insert of your medicine. Read information given by the Food and Drug Administration website:  T[employer]si Treat or prevent constipation You may need to take these actions to prevent or treat constipation: Drink enough fluid to keep your pee (urine) pale yellow. Take over-the-counter or prescription medicines. Eat foods that are high in fiber. These include beans, whole grains, and fresh fruits and vegetables. Limit foods that are high in fat and sugar. These include fried or sweet foods. Contact a doctor if: Your medicine is not helping with your pain. You have a rash. You feel sick to your stomach. You throw up. You feel depressed. Get help right away if: You have trouble breathing. This means: Breathing that is slower than normal. Breathing that is more shallow than normal. You are confused. You are sleeping a lot, or you have trouble staying awake. Your skin or lips turn pale or bluish in color. You tongue swells. You have thoughts of harming yourself or harming others. These symptoms may be an emergency. Get help right away. Call your local emergency services (911 in the [place] Do not wait to see if the symptoms will go away. Do not drive yourself to the hospital. Get help right away if you feel like you may hurt yourself or others, or have thoughts about taking your own life. Go to your nearest emergency room or: Call your local emergency services (911 in the [place] Call the [facility] at (323)394-4954 or 988 in the [place] This is open 24 hours a day. Text the Crisis Text Line at 916-694-0905. Summary Pain medicine can help lower your pain. It may also cause side effects. Take your pain medicine exactly as told by your doctor. Talk with your doctor about other ways to manage your pain. Ask what activities you should avoid while taking pain medicine. This information is not intended to replace advice given to you by your health care provider. Make sure you discuss any questions you have with your health care provider. Document Revised: 04/28/2021 Document  Reviewed: 02/10/2021 Elsevier Patient Education  [employer].

## 2021-10-29 ENCOUNTER — Telehealth: Payer: Self-pay | Admitting: Hematology

## 2021-10-29 NOTE — Telephone Encounter (Signed)
Sch per 1/13 secure chat, pt aware

## 2021-11-01 ENCOUNTER — Encounter (HOSPITAL_COMMUNITY): Payer: Self-pay | Admitting: Hematology

## 2021-11-02 ENCOUNTER — Other Ambulatory Visit: Payer: Self-pay

## 2021-11-02 ENCOUNTER — Inpatient Hospital Stay: Payer: Medicare Other

## 2021-11-02 ENCOUNTER — Non-Acute Institutional Stay: Payer: Self-pay | Admitting: Hospice

## 2021-11-02 ENCOUNTER — Encounter: Payer: Self-pay | Admitting: Nurse Practitioner

## 2021-11-02 ENCOUNTER — Other Ambulatory Visit: Payer: Self-pay | Admitting: Hematology

## 2021-11-02 ENCOUNTER — Inpatient Hospital Stay (HOSPITAL_BASED_OUTPATIENT_CLINIC_OR_DEPARTMENT_OTHER): Payer: Medicare Other | Admitting: Nurse Practitioner

## 2021-11-02 ENCOUNTER — Ambulatory Visit (HOSPITAL_COMMUNITY)
Admission: RE | Admit: 2021-11-02 | Discharge: 2021-11-02 | Disposition: A | Discharge status: Home / Self Care | Payer: Medicare Other | Source: Ambulatory Visit | Attending: Hematology | Admitting: Hematology

## 2021-11-02 VITALS — BP 110/80 | HR 93 | Resp 17 | Wt 123.3 lb

## 2021-11-02 DIAGNOSIS — K5900 Constipation, unspecified: Secondary | ICD-10-CM | POA: Diagnosis not present

## 2021-11-02 DIAGNOSIS — Z515 Encounter for palliative care: Secondary | ICD-10-CM

## 2021-11-02 DIAGNOSIS — Z7189 Other specified counseling: Secondary | ICD-10-CM

## 2021-11-02 DIAGNOSIS — G893 Neoplasm related pain (acute) (chronic): Secondary | ICD-10-CM

## 2021-11-02 DIAGNOSIS — C801 Malignant (primary) neoplasm, unspecified: Secondary | ICD-10-CM

## 2021-11-02 DIAGNOSIS — C20 Malignant neoplasm of rectum: Secondary | ICD-10-CM | POA: Diagnosis not present

## 2021-11-02 DIAGNOSIS — Z95828 Presence of other vascular implants and grafts: Secondary | ICD-10-CM

## 2021-11-02 DIAGNOSIS — R531 Weakness: Secondary | ICD-10-CM

## 2021-11-02 DIAGNOSIS — R188 Other ascites: Secondary | ICD-10-CM | POA: Diagnosis not present

## 2021-11-02 DIAGNOSIS — C787 Secondary malignant neoplasm of liver and intrahepatic bile duct: Secondary | ICD-10-CM

## 2021-11-02 DIAGNOSIS — R530 Neoplastic (malignant) related fatigue: Secondary | ICD-10-CM | POA: Diagnosis not present

## 2021-11-02 LAB — CBC WITH DIFFERENTIAL (CANCER CENTER ONLY)
Abs Immature Granulocytes: 0.15 10*3/uL — ABNORMAL HIGH (ref 0.00–0.07)
Basophils Absolute: 0.1 10*3/uL (ref 0.0–0.1)
Basophils Relative: 0 %
Eosinophils Absolute: 0.1 10*3/uL (ref 0.0–0.5)
Eosinophils Relative: 1 %
HCT: 34.2 % — ABNORMAL LOW (ref 39.0–52.0)
Hemoglobin: 10.7 g/dL — ABNORMAL LOW (ref 13.0–17.0)
Immature Granulocytes: 1 %
Lymphocytes Relative: 10 %
Lymphs Abs: 1.4 10*3/uL (ref 0.7–4.0)
MCH: 26.8 pg (ref 26.0–34.0)
MCHC: 31.3 g/dL (ref 30.0–36.0)
MCV: 85.5 fL (ref 80.0–100.0)
Monocytes Absolute: 0.8 10*3/uL (ref 0.1–1.0)
Monocytes Relative: 6 %
Neutro Abs: 11.5 10*3/uL — ABNORMAL HIGH (ref 1.7–7.7)
Neutrophils Relative %: 82 %
Platelet Count: 467 10*3/uL — ABNORMAL HIGH (ref 150–400)
RBC: 4 MIL/uL — ABNORMAL LOW (ref 4.22–5.81)
RDW: 20.8 % — ABNORMAL HIGH (ref 11.5–15.5)
WBC Count: 14 10*3/uL — ABNORMAL HIGH (ref 4.0–10.5)
nRBC: 0 % (ref 0.0–0.2)

## 2021-11-02 LAB — CMP (CANCER CENTER ONLY)
ALT: 40 U/L (ref 0–44)
AST: 92 U/L — ABNORMAL HIGH (ref 15–41)
Albumin: 2.6 g/dL — ABNORMAL LOW (ref 3.5–5.0)
Alkaline Phosphatase: 580 U/L — ABNORMAL HIGH (ref 38–126)
Anion gap: 7 (ref 5–15)
BUN: 14 mg/dL (ref 8–23)
CO2: 31 mmol/L (ref 22–32)
Calcium: 8.3 mg/dL — ABNORMAL LOW (ref 8.9–10.3)
Chloride: 98 mmol/L (ref 98–111)
Creatinine: 0.4 mg/dL — ABNORMAL LOW (ref 0.61–1.24)
GFR, Estimated: >60 mL/min (ref >60)
Glucose, Bld: 120 mg/dL — ABNORMAL HIGH (ref 70–99)
Potassium: 4.4 mmol/L (ref 3.5–5.1)
Sodium: 136 mmol/L (ref 135–145)
Total Bilirubin: 0.6 mg/dL (ref 0.3–1.2)
Total Protein: 6.5 g/dL (ref 6.5–8.1)

## 2021-11-02 MED ORDER — HEPARIN SOD (PORK) LOCK FLUSH 100 UNIT/ML IV SOLN
500.0000 [IU] | Freq: Once | INTRAVENOUS | Status: AC
  Administered 2021-11-02: 500 [IU]

## 2021-11-02 MED ORDER — ALPRAZOLAM 1 MG PO TABS: 1.0000 mg | ORAL_TABLET | Freq: Three times a day (TID) | ORAL | 0 refills | Status: AC

## 2021-11-02 MED ORDER — SODIUM CHLORIDE 0.9% FLUSH
10.0000 mL | Freq: Once | INTRAVENOUS | Status: AC
  Administered 2021-11-02: 10 mL

## 2021-11-02 MED ORDER — OXYCODONE HCL 10 MG PO TABS: 10.0000 mg | ORAL_TABLET | ORAL | 0 refills | Status: DC | PRN

## 2021-11-02 MED ORDER — OXYCODONE HCL 5 MG PO TABS
20.0000 mg | ORAL_TABLET | Freq: Once | ORAL | Status: AC
  Administered 2021-11-02: 20 mg via ORAL
  Filled 2021-11-02: qty 4

## 2021-11-02 MED ORDER — OXYCODONE HCL 5 MG PO TABS: 20.0000 mg | ORAL_TABLET | Freq: Once | ORAL | Status: DC

## 2021-11-02 MED ORDER — LACTULOSE 10 GM/15ML PO SOLN: 10.0000 g | Freq: Two times a day (BID) | ORAL | 2 refills | Status: DC | PRN

## 2021-11-02 MED ORDER — LIDOCAINE HCL 1 % IJ SOLN
INTRAMUSCULAR | Status: AC
  Administered 2021-11-02: 10 mL
  Filled 2021-11-02: qty 20

## 2021-11-02 NOTE — Patient Instructions (Signed)
Mr. Loya was seen today for symptom management and goals of care at [facility]. Please be advised at the following instructions:   Alprazaolam (Xanax) 1 mg Take 1 tablet by mouth three (3) times daily.  Oxycodone HCL 20 mg   Take 2 tablets (10 mg each) every 3 hours for pain.  Continue Oxycontin 60 mg twice per day Lactulose 28ml twice per day for constipation.   Please make sure patient is receiving pain medications per schedule to allow for comfort and effective pain relief.   Thank you,  Jalicia Roszak "Lexine Baton", NP

## 2021-11-02 NOTE — Progress Notes (Signed)
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 a left hepatic lesion. 2. Ultrasound-guided paracentesis.  1.9 L of fluid was removed. Electronically Signed   By: Markus Daft M.D.   On: 10/12/2021 16:00   US Paracentesis  Result Date: 10/08/2021 INDICATION: Patient with history of asthma, ADHD, COPD, weight loss, recent imaging revealing diffuse hepatic metastatic disease, ascites, right lower lobe pulmonary nodule. Request received for diagnostic and therapeutic paracentesis. EXAM: ULTRASOUND GUIDED DIAGNOSTIC AND THERAPEUTIC PARACENTESIS MEDICATIONS: 10 mL 1% lidocaine COMPLICATIONS: None immediate. PROCEDURE: Informed written consent was obtained from the patient after a discussion of the risks, benefits and alternatives to treatment. A timeout was performed prior to the initiation of the procedure. Initial ultrasound scanning demonstrates a small amount of ascites within the right lower abdominal quadrant. The right lower abdomen was prepped and draped in the usual sterile fashion. 1% lidocaine was used for local anesthesia. Following this, a 19 gauge, 7-cm, Yueh catheter was introduced. An ultrasound image was saved for  documentation purposes. The paracentesis was performed. The catheter was removed and a dressing was applied. The patient tolerated the procedure well without immediate post procedural complication. FINDINGS: A total of approximately 1.6 liters of clear, yellow fluid was removed. Samples were sent to the laboratory as requested by the clinical team. IMPRESSION: Successful ultrasound-guided diagnostic and therapeutic paracentesis yielding 1.6 liters of peritoneal fluid. Read by: Rowe Robert, PA-C Electronically Signed   By: Michaelle Birks M.D.   On: 10/08/2021 17:03   DG Chest Port 1 View  Result Date: 10/11/2021 CLINICAL DATA:  67 year old male with arrhythmia, leukocytosis. EXAM: PORTABLE CHEST 1 VIEW COMPARISON:  Chest CT 10/08/2021 and earlier. FINDINGS: Portable AP semi upright view at 0709 hours. Mildly lower lung volumes with evidence of chronic pulmonary hyperinflation on the recent CT demonstrating emphysema. Mediastinal contours remain within normal limits. Calcified aortic atherosclerosis. Visualized tracheal air column is within normal limits. Allowing for portable technique the lungs are clear. No pneumothorax or pleural effusion. Mild chronic lung base scarring. Paucity of bowel gas in the upper abdomen. No acute osseous abnormality identified. IMPRESSION: Emphysema (ICD10-J43.9). No acute cardiopulmonary abnormality. Electronically Signed   By: Genevie Ann M.D.   On: 10/11/2021 07:34   ECHOCARDIOGRAM COMPLETE  Result Date: 10/08/2021    ECHOCARDIOGRAM REPORT   Patient Name:   JACODY BENEKE Date of Exam: 10/08/2021 Medical Rec #:  [id number]         Height:       66.0 in Accession #:    [id number]        Weight:       118.2 lb Date of Birth:  1956/09/04         BSA:          1.599 m Patient Age:    65 years          BP:           156/72 mmHg Patient Gender: M                 HR:           73 bpm. Exam Location:  Inpatient Procedure: 2D Echo, 3D Echo, Cardiac Doppler, Color Doppler and Strain  Analysis Indications:    Elevated Troponin  History:        Patient has no prior history of Echocardiogram examinations.                 Signs/Symptoms:Shortness of Breath. Bilateral lower extremity                 edema.  Sonographer:    Darlina Sicilian RDCS Referring Phys: [place]  1. Left ventricular ejection fraction, by estimation, is 60 to 65%. Left ventricular ejection fraction by 3D volume is 62 %. The left ventricle has normal function. The left ventricle has no regional wall motion abnormalities. There is mild concentric left ventricular hypertrophy. Left ventricular diastolic parameters are consistent with Grade I diastolic dysfunction (impaired relaxation).  2. Right ventricular systolic function is normal. The right ventricular size is normal.  3. The mitral valve is normal in structure. No evidence of mitral valve regurgitation. No evidence of mitral stenosis. Moderate mitral annular calcification.  4. The aortic valve is tricuspid. There is mild calcification of the aortic valve. There is mild thickening of the aortic valve. Aortic valve regurgitation is trivial. Aortic valve sclerosis is present, with no evidence of aortic valve stenosis.  5. The inferior vena cava is normal in size with greater than 50% respiratory variability, suggesting right atrial pressure of 3 mmHg. FINDINGS  Left Ventricle: Left ventricular ejection fraction, by estimation, is 60 to 65%. Left ventricular ejection fraction by 3D volume is 62 %. The left ventricle has normal function. The left ventricle has no regional wall motion abnormalities. Global longitudinal strain performed but not reported based on interpreter judgement due to suboptimal tracking. The left ventricular internal cavity size was normal in size. There is mild concentric left ventricular hypertrophy. Left ventricular diastolic parameters are consistent with Grade I diastolic dysfunction (impaired relaxation). Normal left  ventricular filling pressure. Right Ventricle: The right ventricular size is normal. No increase in right ventricular wall thickness. Right ventricular systolic function is normal. Left Atrium: Left atrial size was normal in size. Right Atrium: Right atrial size was normal in size. Pericardium: There is no evidence of pericardial effusion. Mitral Valve: The mitral valve is normal in structure. Moderate mitral annular calcification. No evidence of mitral valve regurgitation. No evidence of mitral valve stenosis. Tricuspid Valve: The tricuspid valve is normal in structure. Tricuspid valve regurgitation is not demonstrated. No evidence of tricuspid stenosis. Aortic Valve: The aortic valve is tricuspid. There is mild calcification of the aortic valve. There is mild thickening of the aortic valve. Aortic valve regurgitation is trivial. Aortic regurgitation PHT measures 411 msec. Aortic valve sclerosis is present, with no evidence of aortic valve stenosis. Pulmonic Valve: The pulmonic valve was normal in structure. Pulmonic valve regurgitation is not visualized. No evidence of pulmonic stenosis. Aorta: The aortic root is normal in size and structure. Venous: The inferior vena cava is normal in size  with greater than 50% respiratory variability, suggesting right atrial pressure of 3 mmHg. IAS/Shunts: No atrial level shunt detected by color flow Doppler.  LEFT VENTRICLE PLAX 2D LVIDd:         3.80 cm         Diastology LVIDs:         2.30 cm         LV e' medial:    5.33 cm/s LV PW:         1.20 cm         LV E/e' medial:  8.7 LV IVS:        1.20 cm         LV e' lateral:   7.51 cm/s LVOT diam:     1.90 cm         LV E/e' lateral: 6.2 LV SV:         51 LV SV Index:   32 LVOT Area:     2.84 cm        3D Volume EF                                LV 3D EF:    Left                                             ventricul                                             ar                                             ejection                                              fraction                                             by 3D                                             volume is                                             62 %.                                 3D Volume EF:  3D EF:        62 %                                LV EDV:       111 ml                                LV ESV:       42 ml                                LV SV:        69 ml RIGHT VENTRICLE RV S prime:     14.50 cm/s TAPSE (M-mode): 2.2 cm LEFT ATRIUM             Index        RIGHT ATRIUM          Index LA diam:        3.30 cm 2.06 cm/m   RA Area:     8.60 cm LA Vol (A2C):   59.0 ml 36.87 ml/m  RA Volume:   13.50 ml 8.44 ml/m LA Vol (A4C):   39.6 ml 24.76 ml/m LA Biplane Vol: 46.9 ml 29.33 ml/m  AORTIC VALVE LVOT Vmax:   85.70 cm/s LVOT Vmean:  46.500 cm/s LVOT VTI:    0.181 m AI PHT:      411 msec  AORTA Ao Root diam: 3.30 cm Ao Asc diam:  3.00 cm MITRAL VALVE MV Area (PHT): 1.85 cm    SHUNTS MV Decel Time: 409 msec    Systemic VTI:  0.18 m MV E velocity: 46.60 cm/s  Systemic Diam: 1.90 cm MV A velocity: 91.70 cm/s MV E/A ratio:  0.51 Mihai Croitoru MD Electronically signed by Sanda Klein MD Signature Date/Time: 10/08/2021/3:24:42 PM    Final    IR IMAGING GUIDED PORT INSERTION  Result Date: 10/20/2021 CLINICAL DATA:  Metastatic colorectal carcinoma, needs durable venous access for planned treatment regimen EXAM: TUNNELED PORT CATHETER PLACEMENT WITH ULTRASOUND AND FLUOROSCOPIC GUIDANCE FLUOROSCOPY TIME:  30 seconds; 1 mGy ANESTHESIA/SEDATION: Intravenous Fentanyl 124mcg and Versed $RemoveBe'2mg'[id number]$  were administered as conscious sedation during continuous monitoring of the patient's level of consciousness and physiological / cardiorespiratory status by the radiology RN, with a total moderate sedation time of 14 minutes. TECHNIQUE: The procedure, risks, benefits, and alternatives were explained to the patient. Questions regarding the procedure were  encouraged and answered. The patient understands and consents to the procedure. Patency of the right IJ vein was confirmed with ultrasound with image documentation. An appropriate skin site was determined. Skin site was marked. Region was prepped using maximum barrier technique including cap and mask, sterile gown, sterile gloves, large sterile sheet, and Chlorhexidine as cutaneous antisepsis. The region was infiltrated locally with 1% lidocaine. Under real-time ultrasound guidance, the right IJ vein was accessed with a 21 gauge micropuncture needle; the needle tip within the vein was confirmed with ultrasound image documentation. Needle was exchanged over a 018 guidewire for transitional dilator, and vascular measurement was performed. A small incision was made on the right anterior chest wall and a subcutaneous pocket fashioned. The power-injectable port was positioned and its catheter tunneled to the right IJ dermatotomy site. The transitional dilator was exchanged over an Amplatz wire for a peel-away sheath, through which the  port catheter, which had been trimmed to the appropriate length, was advanced and positioned under fluoroscopy with its tip at the cavoatrial junction. Spot chest radiograph confirms good catheter position and no pneumothorax. The port was flushed per protocol. The pocket was closed with deep interrupted and subcuticular continuous 3-0 Monocryl sutures. The incisions were covered with Dermabond then covered with a sterile dressing. The patient tolerated the procedure well. COMPLICATIONS: COMPLICATIONS None immediate IMPRESSION: Technically successful right IJ power-injectable port catheter placement. Ready for routine use. Electronically Signed   By: Lucrezia Europe M.D.   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 Patient has recurrent ascites. Request for therapeutic paracentesis. EXAM: ULTRASOUND  GUIDED THERAPEUTIC LEFT LOWER QUADRANT PARACENTESIS MEDICATIONS: 10 mL 1 % lidocaine COMPLICATIONS: None immediate. PROCEDURE: Informed written consent was obtained from the patient after a discussion of the risks, benefits and alternatives to treatment. A timeout was performed prior to the initiation of the procedure. Initial ultrasound scanning demonstrates a large amount of ascites within the left lower abdominal quadrant. The left lower abdomen was prepped and draped in the usual sterile fashion. 1% lidocaine was used for local anesthesia. Following this, a 19 gauge, 7-cm, Yueh catheter was introduced. An ultrasound image was saved for documentation purposes. The paracentesis was performed. The catheter was removed and a dressing was applied. The patient tolerated the procedure well without immediate post procedural complication. FINDINGS: A total of approximately 2.25 L of clear, yellow fluid was removed. IMPRESSION: Successful ultrasound-guided paracentesis yielding 2.25 liters of peritoneal fluid. Read by: Narda Rutherford, AGNP-BC Electronically Signed   By: Jacqulynn Cadet M.D.   On: 10/28/2021 14:03    PERFORMANCE STATUS (ECOG) : 3 - Symptomatic, >50% confined to bed  Review of Systems  Constitutional:  Positive for appetite change.  Respiratory:  Positive for shortness of breath.   Gastrointestinal:  Positive for abdominal distention and constipation.  Musculoskeletal:  Positive for arthralgias.  Neurological:  Positive for weakness.  Unless otherwise noted, a complete review of systems is negative.  Physical Exam General: NAD, appears weak, cachectic, ill-appearing  Cardiovascular: regular rate and rhythm Pulmonary: diminished bilaterally Abdomen: firm, nontender, largely distended, + bowel sounds Extremities: no edema, no joint deformities Skin: no rashes, muscle wasting  Neurological: Weakness but otherwise nonfocal  IMPRESSION:  This is our initial visit with Mr. Tellez. He  presents to the clinic today with his niece, Jason Mcguire. He is a current resident at [facility] for rehab. Patient appears weak, cachectic, and frail.   I introduced myself, Advertising copywriter, and Palliative's role in collaboration with the oncology team. Concept of Palliative Care was introduced as specialized medical care for people and their families living with serious illness.  It focuses on providing relief from the symptoms and stress of a serious illness.  The goal is to improve quality of life for both the patient and the family. Values and goals of care important to patient and family were attempted to be elicited.   Mr. Meyerhoff shares concerns regarding his care at [facility] facility. He is emotional expressing his uncontrolled pain in addition to constipation. He and his family shares staff have not been administering his medications as prescribed specifically his pain medication. He states he is suffering from the pain until he is able to receive his medication and his long-acting is continuously off schedule. There is not any documents or records that have been sent  with him from the facility to review. Advised I will give them a call to discuss regimen and current orders.   Neoplasm related pain Continues to have ongoing pain which he reports is not controlled, largely due to lack of consistency at facility. His current regimen Oxycontin 60 mg every 12 hours with Oxycodone 20 mg every 3 hours as needed for breakthrough pain. He feels as though he has been receiving the long-acting although off schedule but when receiving Oxycodone for breakthrough the dosage is 25m. AMiachel Rouxshares patient is calling her in discomfort and she is coming to the facility or reaching out to staff as he has gone hours without medications. He has been unable to participate with therapy due to the pain.   Fatigue Reports ongoing fatigue and weakness. He is able to sleep some during the night however is awaken  due to pain and some anxiety related to pain and his declining health. He is actively taking Xanax BID. Is emotional expressing he knows his cancer is terminal and all treatments are with palliative intent. Discussed increasing dosing of Xanax for additional support.   Anorexia/Cachexia/weight loss Appetite is fair. He does not have much of an appetite or when he does eat he will feel full quickly. Notices even more of a decline when abdomen is tight and distended and somewhat of an increase in appetite when fluid has been drained. His weight has maintained at 123lb. No significant loss at this time. Albumin 2.6.   Constipation Reports ongoing constipation. Reports last bowel movement more than 4-5 days prior. Does not think he is taking Miralax although has been requesting. He has prescribed regimen to include Mirala and senna. Education provided on use of lactulose to assist in bowel movement. [facility]   Recurrent Malignant ascites  Abdomen largely distended and tender. Reports shortness of breath when fluid begins to build back up. He is scheduled for a paracentesis on today.Previous history 12/23 (1.6L), 12/27 (1.9L), 1/3 (2.8L), 1/12 (2.25L). given rapid accumulation should possibly consider Pluerx cath for drainage. Poor prognosis with limited life expectancy.   Goals of Care We discussed his current illness and what it means in the larger context of Her on-going co-morbidities. Natural disease trajectory and expectations were discussed.   Mr. TGreenhalghand his niece are realistic in their understanding. He expresses awareness that any and all treatment is with palliative intent. He wishes to proceed with chemotherapy however is clear that he is not optimistic for drastic stability or response. He does not wish to suffer and if he does not tolerate treatment is comfortable discontinuing after one attempt. He is emotional expressing he does not want to live his  last days suffering in pain. Emotional support provided. I discussed at length palliative versus hospice support. Education provided on aggressive symptom management with a focus on his comfort for what time he has left. We discussed once he no longer wishes to continue with interventions hospice at facility or at residential hospice would be most appropriate to allow the symptom management he will need. He and niece verbalized understanding. They both asked appropriate questions. Mr. TRehmanis comfortable in his condition and with dying. He shares he has made his peace with  God.   I approached discussions regarding code status in addition to introduction and education on MOST form. He is clear in expressed wishes for DNR/DNI, no heroic measures and to be allowed a natural death. Amberley verbalizes her agreement. We completed  a MOST form today as requested. The original copy of MOST and DNR form was given to patient in addition to a copy. All forms scanned into [employer]. The patient and family outlined their wishes for the following treatment decisions:  Cardiopulmonary Resuscitation: Do Not Attempt Resuscitation (DNR/No CPR)  Medical Interventions: Comfort Measures: Keep clean, warm, and dry. Use medication by any route, positioning, wound care, and other measures to relieve pain and suffering. Use oxygen, suction and manual treatment of airway obstruction as needed for comfort. Do not transfer to the hospital unless comfort needs cannot be met in current location.  Antibiotics: Determine use of limitation of antibiotics when infection occurs  IV Fluids: No IV fluids (provide other measures to ensure comfort)  Feeding Tube: No feeding tube     I discussed the importance of continued conversation with family and their medical providers regarding overall plan of care and treatment options, ensuring decisions are within the context of the patients values and GOCs.  PLAN: DNR/DNI. MOST form completed.  Patient and niece, Jason Mcguire given original copies of both forms. (See above) Printed instructions and prescriptions sent to [facility].  Oxycodone 20 mg as needed for breakthrough pain Oxycontin 60 mg every 12 hours Xanax TID Lactulose PRN for severe constipation Miralax and Senna daily All orders and instructions sent back with niece Amberley to be given to facility in addition to printed AVS.  Patient escorted to [facility] for scheduled IR guided paracentesis.  Oxy IR 20 mg administered prior to procedure.  Per request from Dr. Burr Medico patient scheduled for weekly paracentesis over the next 2-3 weeks.  He wishes to proceed with treatment however if he does not tolerate or quality of life further declines would then wish to stop and focus solely on his comfort and symptoms for what time he has left. Detailed discussions regarding outpatient hospice support versus residential hospice placement.  Would consider Pleurx placement given need for frequent paracentesis for symptom management.  I will plan to see patient back in a week in collaboration to other oncology appointments. Will require close follow-up in anticipation of rapid decline and increased symptom burden.    Patient expressed understanding and was in agreement with this plan. He also understands that He can call the clinic at any time with any questions, concerns, or complaints.   Time Total: 55 min.   Visit consisted of counseling and education dealing with the complex and emotionally intense issues of symptom management and palliative care in the setting of serious and potentially life-threatening illness.Greater than 50%  of this time was spent counseling and coordinating care related to the above assessment and plan.  Signed by: Alda Lea, AGPCNP-BC Palliative Medicine Team

## 2021-11-02 NOTE — Procedures (Signed)
Ultrasound-guided therapeutic paracentesis performed yielding 2.2 liters of yellow  fluid. No immediate complications.EBL none.

## 2021-11-03 ENCOUNTER — Other Ambulatory Visit: Payer: Self-pay | Admitting: Hematology

## 2021-11-04 ENCOUNTER — Encounter: Payer: Self-pay | Admitting: Nurse Practitioner

## 2021-11-04 ENCOUNTER — Inpatient Hospital Stay: Payer: Medicare Other

## 2021-11-04 ENCOUNTER — Other Ambulatory Visit: Payer: Self-pay

## 2021-11-04 ENCOUNTER — Telehealth: Payer: Self-pay

## 2021-11-04 ENCOUNTER — Inpatient Hospital Stay (HOSPITAL_BASED_OUTPATIENT_CLINIC_OR_DEPARTMENT_OTHER): Payer: Medicare Other | Admitting: Nurse Practitioner

## 2021-11-04 ENCOUNTER — Encounter: Payer: Self-pay | Admitting: Hematology

## 2021-11-04 ENCOUNTER — Inpatient Hospital Stay (HOSPITAL_BASED_OUTPATIENT_CLINIC_OR_DEPARTMENT_OTHER): Payer: Medicare Other | Admitting: Hematology

## 2021-11-04 VITALS — BP 132/78 | HR 93 | Temp 97.5°F | Resp 20 | Ht 66.0 in

## 2021-11-04 DIAGNOSIS — J449 Chronic obstructive pulmonary disease, unspecified: Secondary | ICD-10-CM | POA: Diagnosis not present

## 2021-11-04 DIAGNOSIS — D649 Anemia, unspecified: Secondary | ICD-10-CM | POA: Diagnosis not present

## 2021-11-04 DIAGNOSIS — R180 Malignant ascites: Secondary | ICD-10-CM

## 2021-11-04 DIAGNOSIS — C787 Secondary malignant neoplasm of liver and intrahepatic bile duct: Secondary | ICD-10-CM | POA: Diagnosis not present

## 2021-11-04 DIAGNOSIS — K5900 Constipation, unspecified: Secondary | ICD-10-CM | POA: Diagnosis not present

## 2021-11-04 DIAGNOSIS — R531 Weakness: Secondary | ICD-10-CM | POA: Diagnosis not present

## 2021-11-04 DIAGNOSIS — F17210 Nicotine dependence, cigarettes, uncomplicated: Secondary | ICD-10-CM | POA: Diagnosis not present

## 2021-11-04 DIAGNOSIS — C786 Secondary malignant neoplasm of retroperitoneum and peritoneum: Secondary | ICD-10-CM | POA: Diagnosis not present

## 2021-11-04 DIAGNOSIS — E46 Unspecified protein-calorie malnutrition: Secondary | ICD-10-CM | POA: Diagnosis not present

## 2021-11-04 DIAGNOSIS — Z515 Encounter for palliative care: Secondary | ICD-10-CM | POA: Diagnosis not present

## 2021-11-04 DIAGNOSIS — C20 Malignant neoplasm of rectum: Secondary | ICD-10-CM | POA: Diagnosis not present

## 2021-11-04 DIAGNOSIS — Z7189 Other specified counseling: Secondary | ICD-10-CM

## 2021-11-04 DIAGNOSIS — R634 Abnormal weight loss: Secondary | ICD-10-CM

## 2021-11-04 DIAGNOSIS — Z79899 Other long term (current) drug therapy: Secondary | ICD-10-CM | POA: Diagnosis not present

## 2021-11-04 DIAGNOSIS — G893 Neoplasm related pain (acute) (chronic): Secondary | ICD-10-CM

## 2021-11-04 DIAGNOSIS — Z95828 Presence of other vascular implants and grafts: Secondary | ICD-10-CM

## 2021-11-04 MED ORDER — OXYCODONE HCL 10 MG PO TABS: 10.0000 mg | ORAL_TABLET | ORAL | 0 refills | Status: AC | PRN

## 2021-11-04 MED ORDER — OXYCODONE HCL ER 60 MG PO T12A
60.0000 mg | EXTENDED_RELEASE_TABLET | Freq: Two times a day (BID) | ORAL | 0 refills | Status: AC
Start: 2021-11-04 — End: ?

## 2021-11-04 MED ORDER — LACTULOSE 10 GM/15ML PO SOLN: 10.0000 g | Freq: Two times a day (BID) | ORAL | 2 refills | Status: AC | PRN

## 2021-11-04 MED ORDER — SODIUM CHLORIDE 0.9% FLUSH: 10.0000 mL | Freq: Once | INTRAVENOUS | Status: DC
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
## 2021-11-04 NOTE — Progress Notes (Signed)
Jason Mcguire, his niece Jason Mcguire, and friend came in today for Jason Mcguire's infusion appt but wanted to speak to Dr. Burr Medico and Lexine Baton, NP about proceeding with Hospice instead. Tammi Sou, RN and I answered patient's questions about hospice and provided emotional support and listening.  Once Hospice was decided, I contacted Jason Mcguire of [facility] and gave her the referral. I explained Jason Mcguire's situation and that he would be discharging from his rehab on Sunday, going to his niece's mother's house. I gave her the address to her house and Jason Mcguire's number to contact. Lexine Baton, NP agreed to be the attending and I relayed this information to Jason Mcguire. I attempted to schedule Jason Mcguire for a Pleurx drain placement. The earliest he could be scheduled was early February at [facility]. I tried calling Jason Mcguire but I received no answer or call back. I will follow up on Monday for this.

## 2021-11-04 NOTE — Telephone Encounter (Signed)
Mr. Jason Mcguire niece, Jason Mcguire, called our office to inform us that the facility he is at is still giving him Xanax two times a day although we had changed the prescription to three times a day. I reviewed his AVS and medication list, both of which we printed for the facility and for [place], and they are showing instructions of three times a day. I relayed this information to [place] and advised her to ask the facility to look over this again. I advised for the facility to call us if there are any other problems. Understanding verbalized. All questions answered.
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
## 2021-11-05 ENCOUNTER — Other Ambulatory Visit: Payer: Self-pay

## 2021-11-06 ENCOUNTER — Inpatient Hospital Stay: Payer: Medicare Other

## 2021-11-09 LAB — FUNGUS CULTURE WITH STAIN

## 2021-11-09 LAB — FUNGAL ORGANISM REFLEX

## 2021-11-09 LAB — FUNGUS CULTURE RESULT

## 2021-11-10 ENCOUNTER — Ambulatory Visit (HOSPITAL_COMMUNITY): Payer: Medicare Other

## 2021-11-10 ENCOUNTER — Telehealth (HOSPITAL_COMMUNITY): Payer: Self-pay

## 2021-11-10 ENCOUNTER — Ambulatory Visit: Payer: Medicare Other | Admitting: Hematology

## 2021-11-10 ENCOUNTER — Encounter: Payer: Medicare Other | Admitting: Nurse Practitioner

## 2021-11-10 ENCOUNTER — Ambulatory Visit (HOSPITAL_COMMUNITY): Admission: RE | Admit: 2021-11-10 | Payer: Medicare Other | Source: Ambulatory Visit

## 2021-11-10 NOTE — Telephone Encounter (Signed)
-----   Message from Arne Cleveland, MD sent at 11/10/2021  9:41 AM EST ----- Regarding: RE: Pleurx catheter Ok  IR abdominal tunneled drain (Aspira or PleurX) Malig ascites, on hospice  DDH    ----- Message ----- From: Danielle Dess Sent: 11/09/2021   4:32 PM EST To: Ir Procedure Requests Subject: Pleurx catheter                                Procedure: Pleurx catheter placement  Dx: Pleurx catheter placement in the setting of recurrent malignant ascites  Ordering: Jobe Gibbon, NP ([place] [phone])  Please review.   Thanks,  Lia Foyer

## 2021-11-11 ENCOUNTER — Ambulatory Visit (HOSPITAL_COMMUNITY)
Admission: RE | Admit: 2021-11-11 | Discharge: 2021-11-11 | Disposition: A | Discharge status: Home / Self Care | Source: Ambulatory Visit | Attending: Hematology | Admitting: Hematology

## 2021-11-11 ENCOUNTER — Other Ambulatory Visit: Payer: Self-pay

## 2021-11-11 DIAGNOSIS — R188 Other ascites: Secondary | ICD-10-CM | POA: Diagnosis not present

## 2021-11-11 DIAGNOSIS — C20 Malignant neoplasm of rectum: Secondary | ICD-10-CM | POA: Diagnosis not present

## 2021-11-11 DIAGNOSIS — C787 Secondary malignant neoplasm of liver and intrahepatic bile duct: Secondary | ICD-10-CM | POA: Insufficient documentation

## 2021-11-11 MED ORDER — LIDOCAINE HCL 1 % IJ SOLN
INTRAMUSCULAR | Status: AC
  Administered 2021-11-11: 10 mL
  Filled 2021-11-11: qty 20

## 2021-11-11 NOTE — Procedures (Signed)
Ultrasound-guided  therapeutic paracentesis performed yielding 5 liters (maximum ordered) of yellow fluid. No immediate complications.EBL none.   

## 2021-11-15 ENCOUNTER — Other Ambulatory Visit: Payer: Self-pay | Admitting: Radiology

## 2021-11-16 ENCOUNTER — Other Ambulatory Visit: Payer: Self-pay

## 2021-11-16 ENCOUNTER — Ambulatory Visit (HOSPITAL_COMMUNITY)
Admission: RE | Admit: 2021-11-16 | Discharge: 2021-11-16 | Disposition: A | Discharge status: Home / Self Care | Source: Ambulatory Visit | Attending: Nurse Practitioner | Admitting: Nurse Practitioner

## 2021-11-16 DIAGNOSIS — C787 Secondary malignant neoplasm of liver and intrahepatic bile duct: Secondary | ICD-10-CM | POA: Diagnosis not present

## 2021-11-16 DIAGNOSIS — Z539 Procedure and treatment not carried out, unspecified reason: Secondary | ICD-10-CM | POA: Diagnosis not present

## 2021-11-16 DIAGNOSIS — C20 Malignant neoplasm of rectum: Secondary | ICD-10-CM | POA: Diagnosis not present

## 2021-11-16 DIAGNOSIS — R180 Malignant ascites: Secondary | ICD-10-CM | POA: Diagnosis not present

## 2021-11-16 LAB — PROTIME-INR
INR: 1.1 (ref 0.8–1.2)
Prothrombin Time: 14 seconds (ref 11.4–15.2)

## 2021-11-16 LAB — CBC
HCT: 41.4 % (ref 39.0–52.0)
Hemoglobin: 12.7 g/dL — ABNORMAL LOW (ref 13.0–17.0)
MCH: 27.8 pg (ref 26.0–34.0)
MCHC: 30.7 g/dL (ref 30.0–36.0)
MCV: 90.6 fL (ref 80.0–100.0)
Platelets: 322 10*3/uL (ref 150–400)
RBC: 4.57 MIL/uL (ref 4.22–5.81)
RDW: 21.8 % — ABNORMAL HIGH (ref 11.5–15.5)
WBC: 11.6 10*3/uL — ABNORMAL HIGH (ref 4.0–10.5)
nRBC: 0 % (ref 0.0–0.2)

## 2021-11-16 MED ORDER — MIDAZOLAM HCL 2 MG/2ML IJ SOLN
INTRAMUSCULAR | Status: AC
  Filled 2021-11-16: qty 4

## 2021-11-16 MED ORDER — SODIUM CHLORIDE 0.9 % IV SOLN: INTRAVENOUS | Status: DC

## 2021-11-16 MED ORDER — FENTANYL CITRATE (PF) 100 MCG/2ML IJ SOLN
INTRAMUSCULAR | Status: AC
  Filled 2021-11-16: qty 4

## 2021-11-16 MED ORDER — LIDOCAINE HCL 1 % IJ SOLN
INTRAMUSCULAR | Status: AC
  Filled 2021-11-16: qty 20

## 2021-11-16 MED ORDER — VANCOMYCIN HCL IN DEXTROSE 1-5 GM/200ML-% IV SOLN: 1000.0000 mg | INTRAVENOUS | Status: AC

## 2021-11-16 MED ORDER — VANCOMYCIN HCL IN DEXTROSE 1-5 GM/200ML-% IV SOLN
INTRAVENOUS | Status: AC
  Administered 2021-11-16: 1000 mg via INTRAVENOUS
  Filled 2021-11-16: qty 200
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
## 2021-11-16 NOTE — Sedation Documentation (Signed)
As per provider, procedure not to be completed today. Antibiotics stopped

## 2021-11-18 ENCOUNTER — Other Ambulatory Visit: Payer: Medicare Other

## 2021-11-18 ENCOUNTER — Encounter: Payer: Medicare Other | Admitting: Nurse Practitioner

## 2021-11-18 ENCOUNTER — Ambulatory Visit: Payer: Medicare Other | Admitting: Nurse Practitioner

## 2021-11-18 ENCOUNTER — Ambulatory Visit: Payer: Medicare Other

## 2021-11-23 ENCOUNTER — Other Ambulatory Visit: Payer: Self-pay | Admitting: Radiology

## 2021-11-24 ENCOUNTER — Encounter (HOSPITAL_COMMUNITY): Payer: Self-pay

## 2021-11-24 ENCOUNTER — Ambulatory Visit (HOSPITAL_COMMUNITY): Admission: RE | Admit: 2021-11-24 | Payer: Medicare Other | Source: Ambulatory Visit

## 2021-12-01 ENCOUNTER — Other Ambulatory Visit: Payer: Medicare Other

## 2021-12-01 ENCOUNTER — Ambulatory Visit: Payer: Medicare Other | Admitting: Hematology

## 2021-12-01 ENCOUNTER — Ambulatory Visit: Payer: Medicare Other

## 2021-12-15 DEATH — deceased

## 2022-04-15 ENCOUNTER — Other Ambulatory Visit: Payer: Self-pay

## 2023-04-24 IMAGING — US IR PARACENTESIS
1 series · 7 of 7 positions shown · non-contrast
Comparison: none

INDICATION: History of metastatic rectal cancer with Mets to the liver. Patient
has recurrent ascites. Request for therapeutic paracentesis.

[Series 1: ir (person_name)/(person_name) · 7 of 7 slices shown]
[im 1/7]
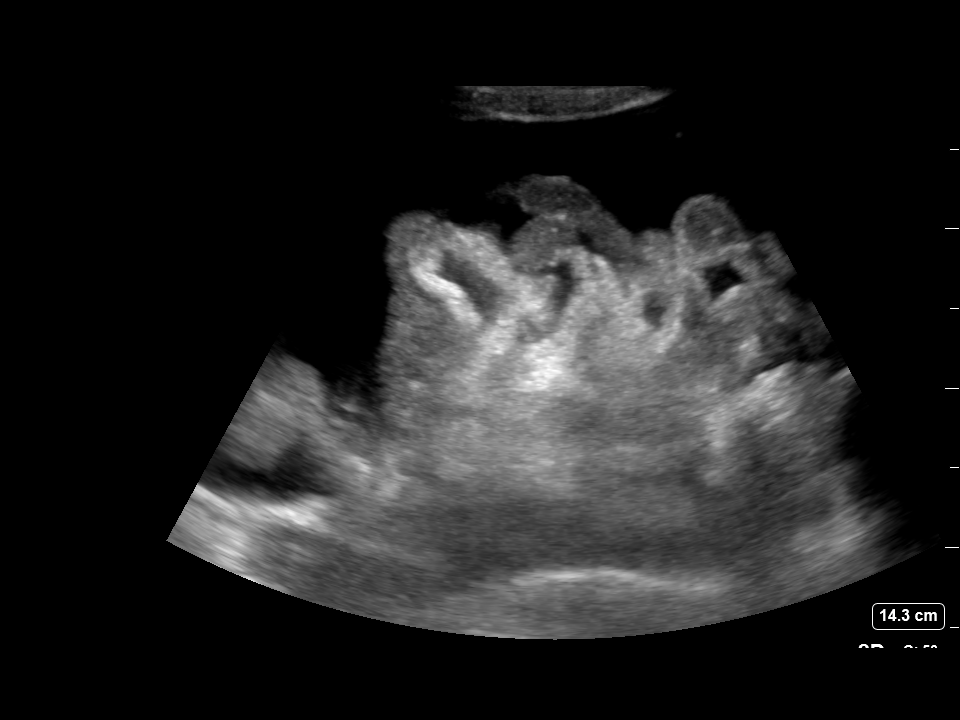
[im 2/7]
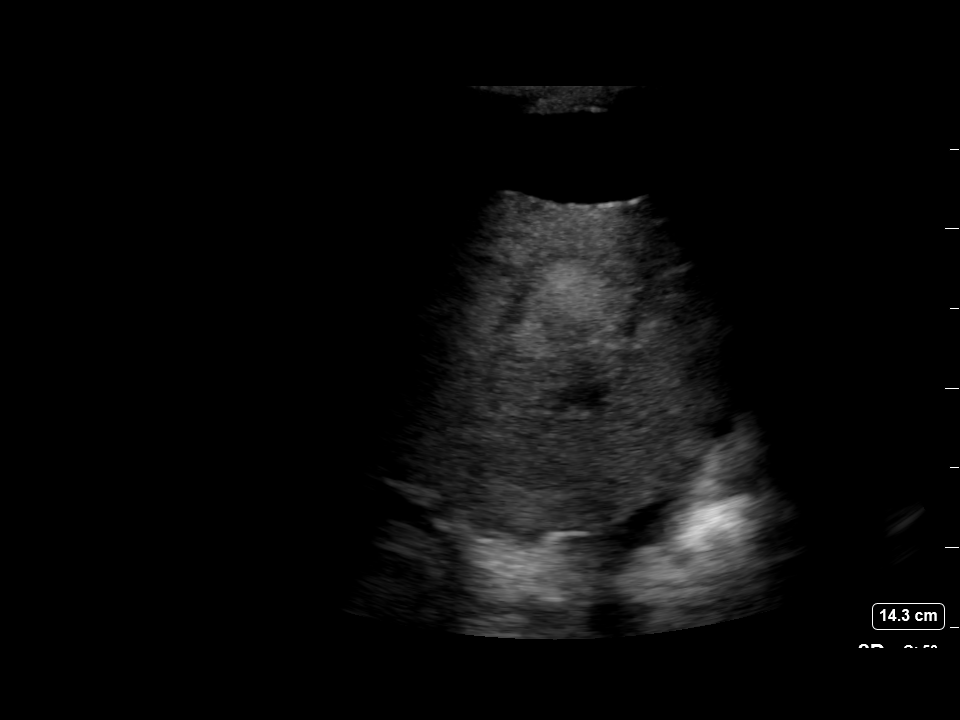
[im 3/7]
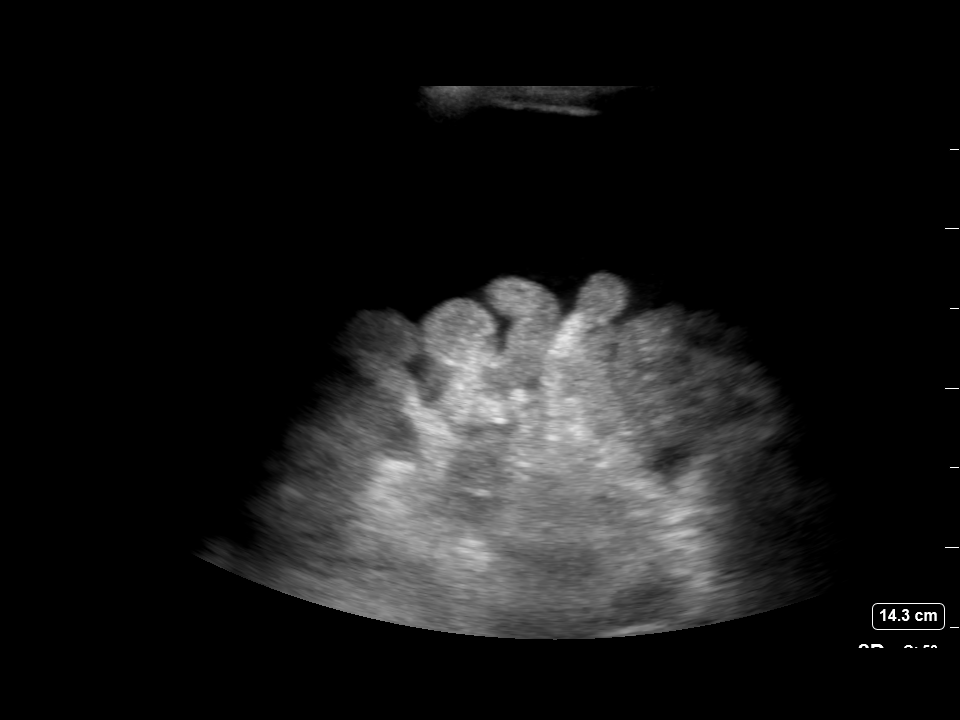
[im 4/7]
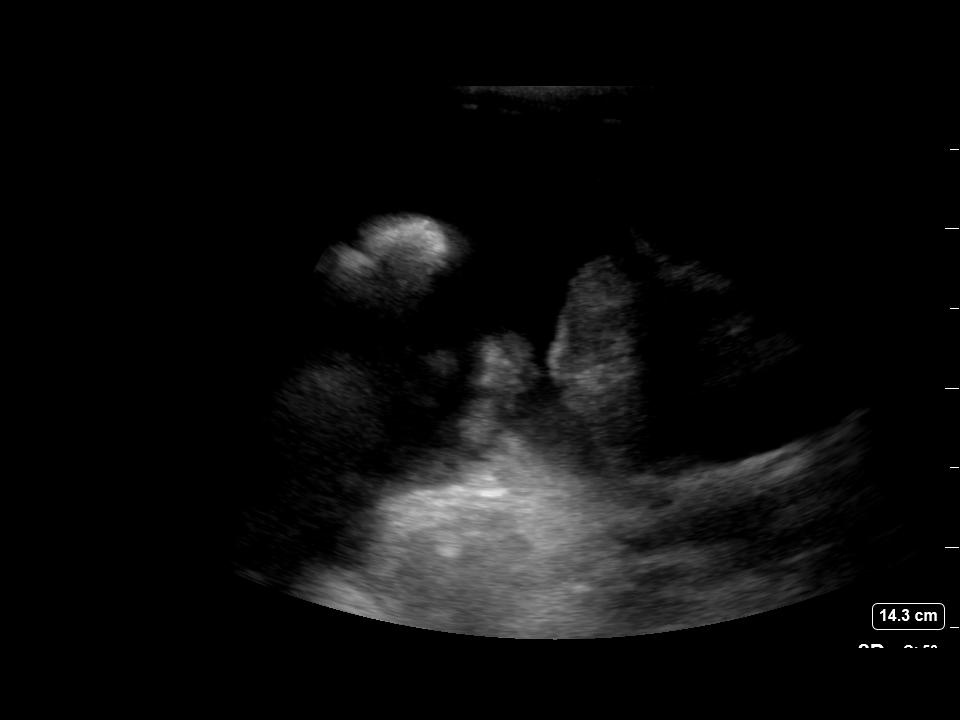
[im 5/7]
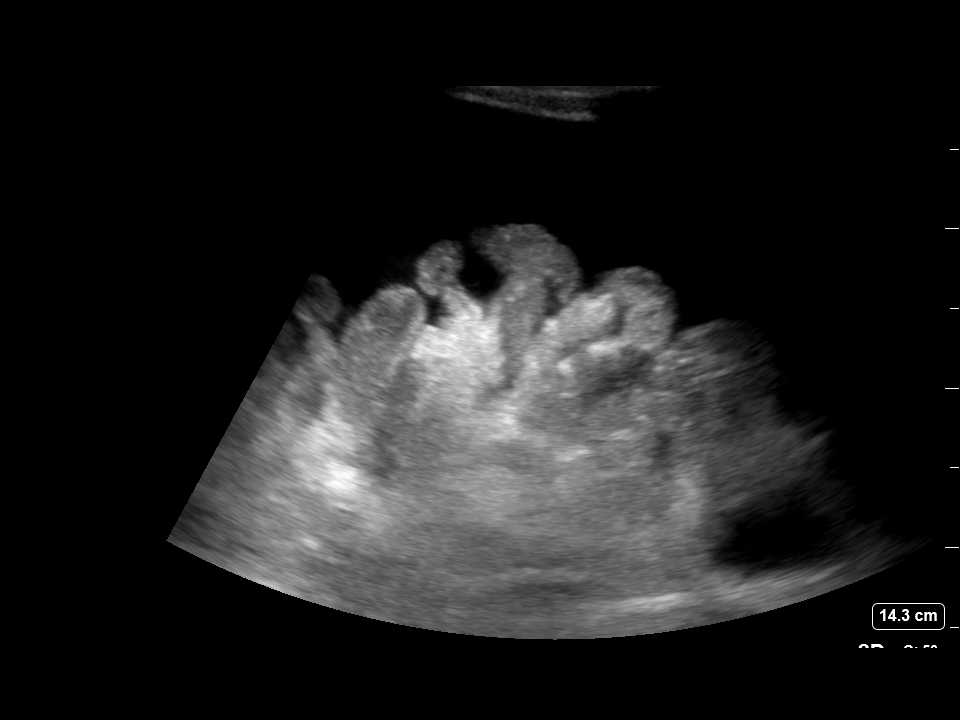
[im 6/7]
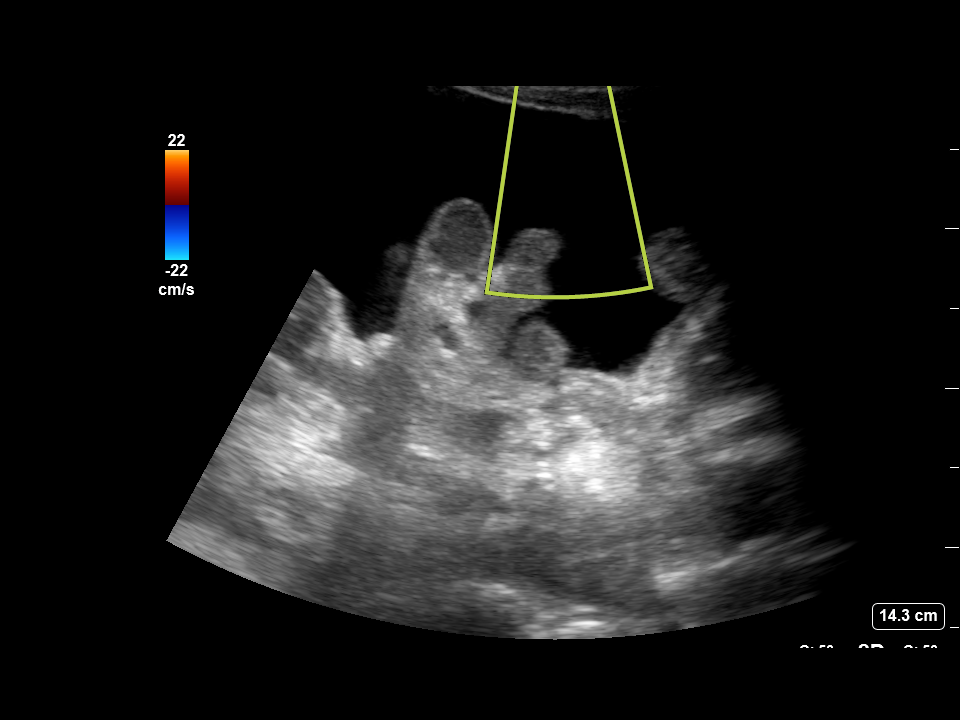
[im 7/7]
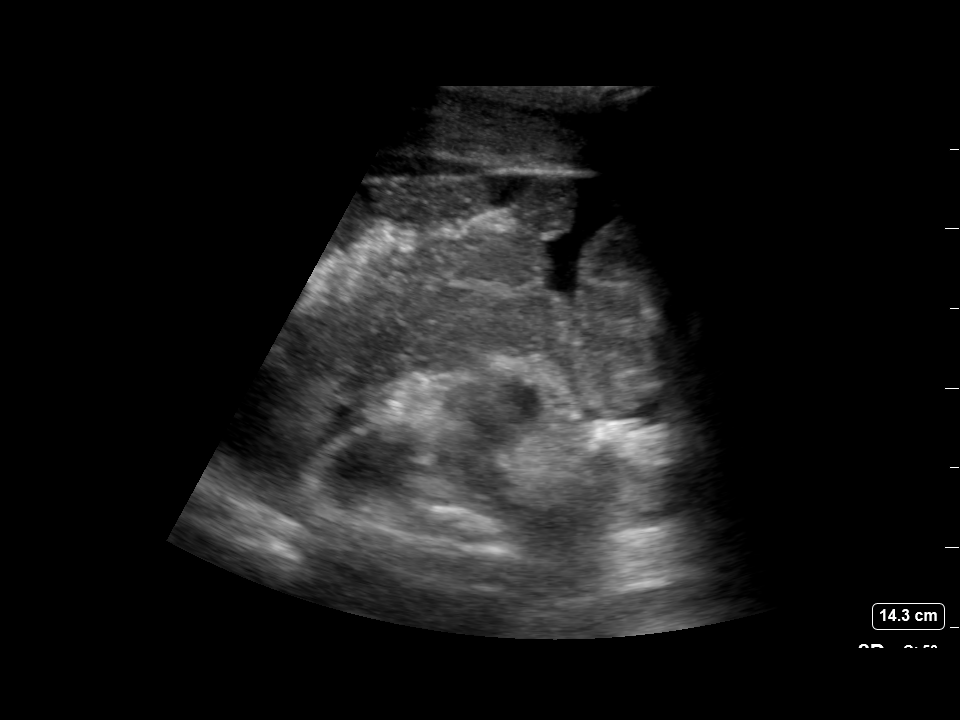

[7 of 7 positions shown; findings below may reference images not displayed]

EXAM:
ULTRASOUND GUIDED THERAPEUTIC LEFT LOWER QUADRANT PARACENTESIS

MEDICATIONS:
10 mL 1 % lidocaine

COMPLICATIONS:
None immediate.

PROCEDURE:
Informed written consent was obtained from the patient after a
discussion of the risks, benefits and alternatives to treatment. A
timeout was performed prior to the initiation of the procedure.

Initial ultrasound scanning demonstrates a large amount of ascites
within the left lower abdominal quadrant. The left lower abdomen was
prepped and draped in the usual sterile fashion. 1% lidocaine was
used for local anesthesia.

Following this, a 19 gauge, 7-cm, Yueh catheter was introduced. An
ultrasound image was saved for documentation purposes. The
paracentesis was performed. The catheter was removed and a dressing
was applied. The patient tolerated the procedure well without
immediate post procedural complication.
FINDINGS: A total of approximately 2.25 L of clear, yellow fluid was removed.
IMPRESSION: Successful ultrasound-guided paracentesis yielding 2.25 liters of
peritoneal fluid.

## 2023-05-08 IMAGING — US US PARACENTESIS
1 series · 5 of 5 positions shown · non-contrast
Comparison: none

INDICATION: Patient with history of metastatic rectal cancer, recurrent ascites.
Request received for therapeutic paracentesis up to 5 liters.

[Series 1: us paracentesis · 5 of 5 slices shown]
[im 1/5]
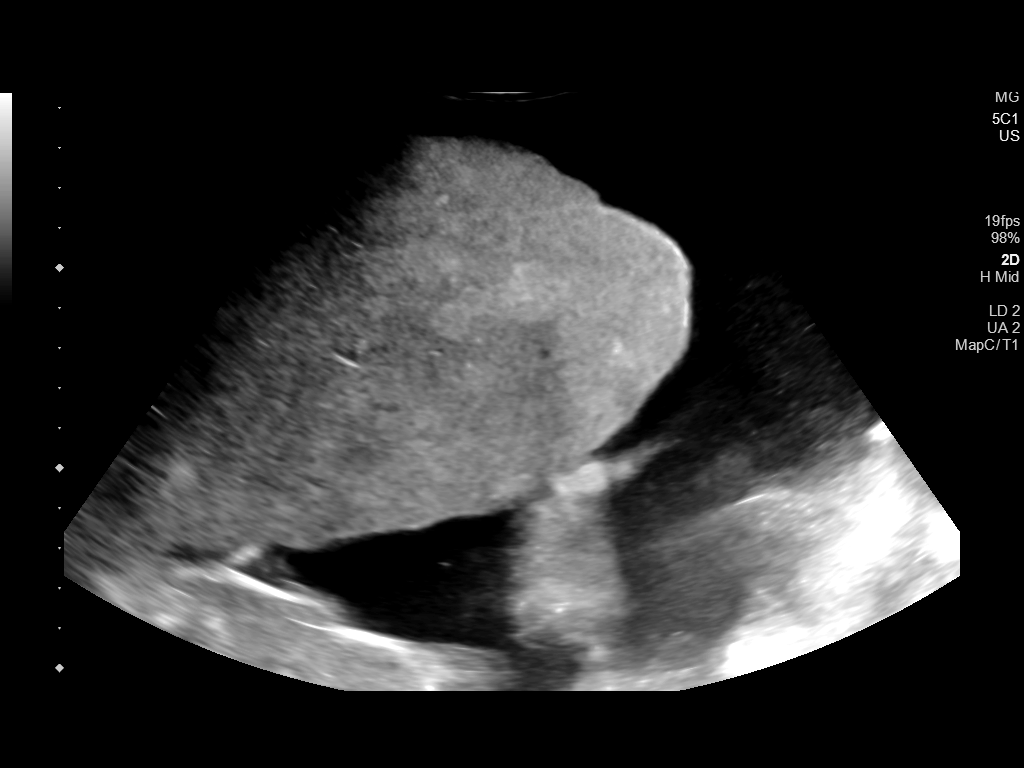
[im 2/5]
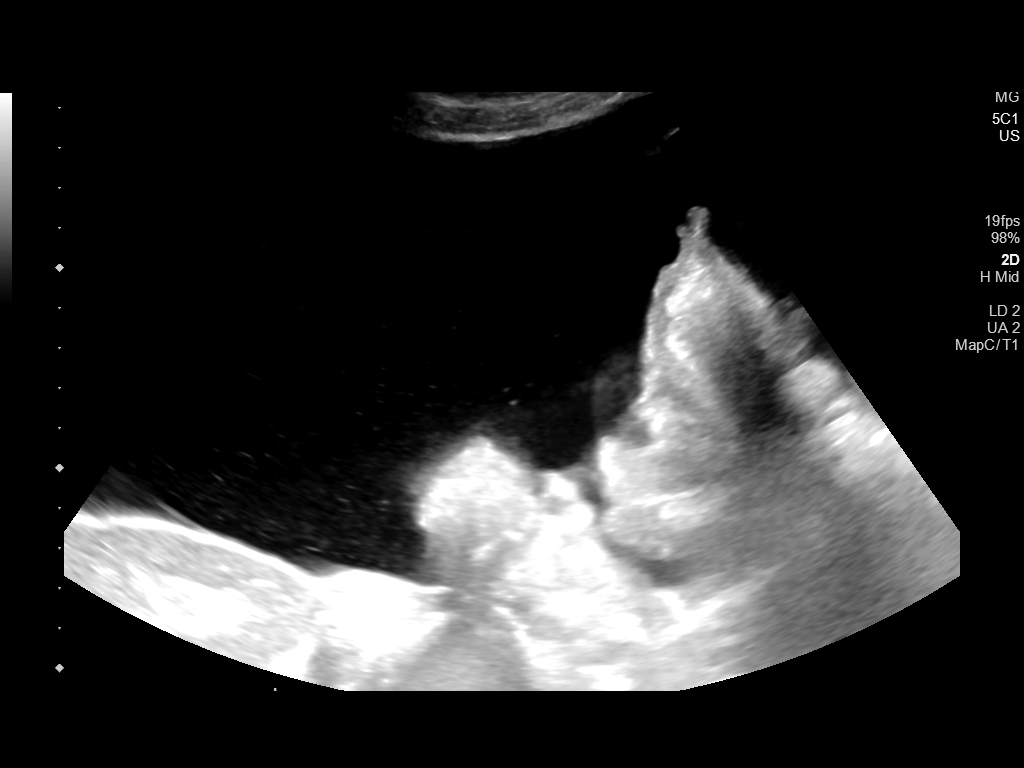
[im 3/5]
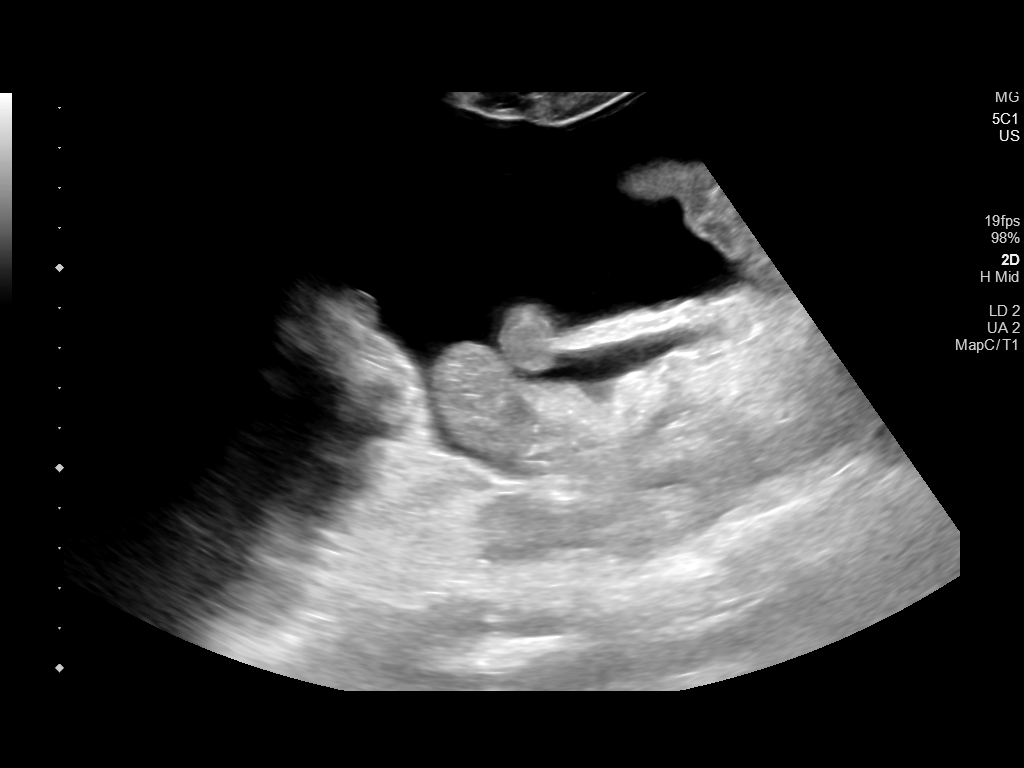
[im 4/5]
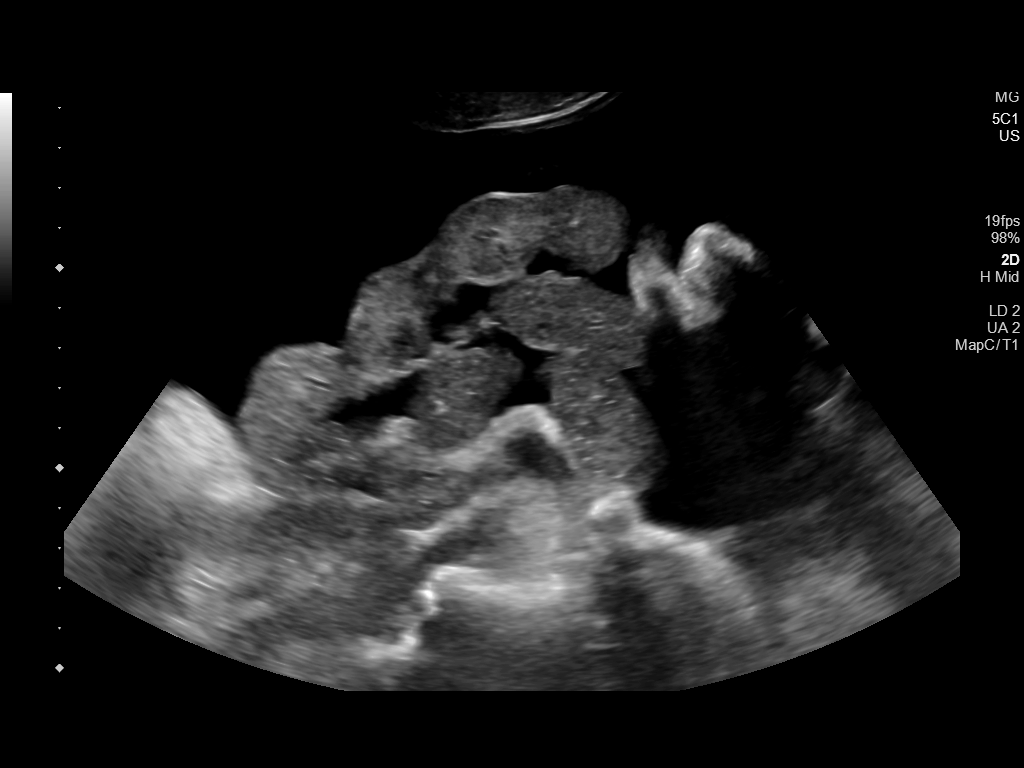
[im 5/5]
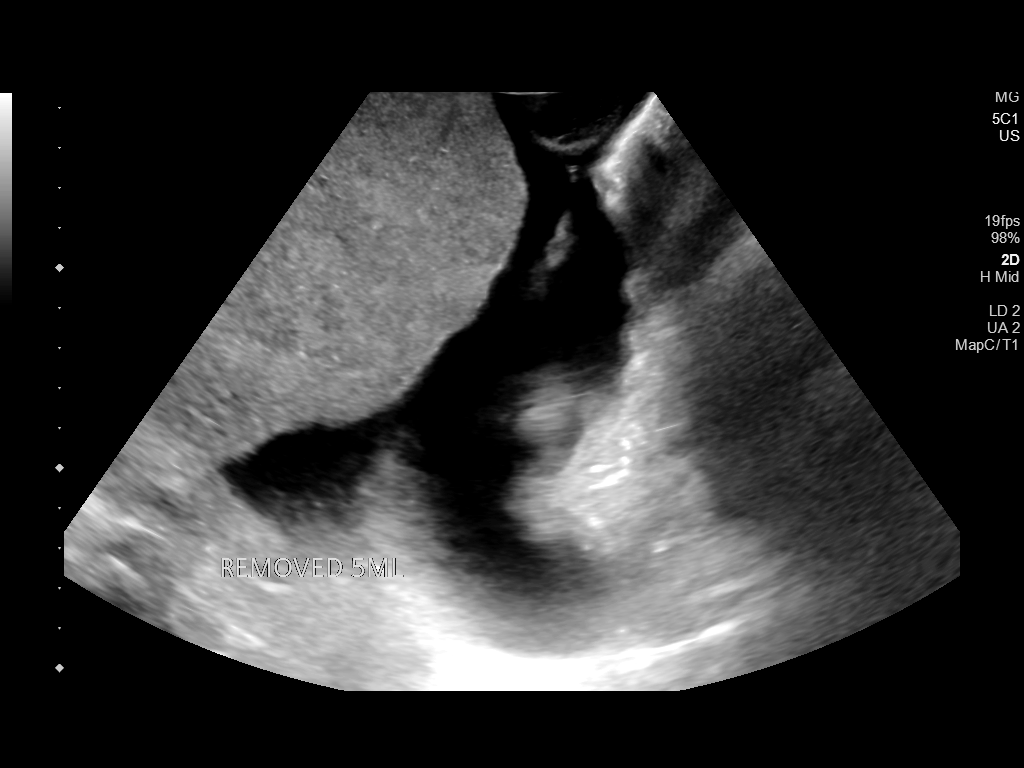

[5 of 5 positions shown; findings below may reference images not displayed]

EXAM:
ULTRASOUND GUIDED THERAPEUTIC PARACENTESIS

MEDICATIONS:
10 mL 1% lidocaine

COMPLICATIONS:
None immediate.

PROCEDURE:
Informed written consent was obtained from the patient after a
discussion of the risks, benefits and alternatives to treatment. A
timeout was performed prior to the initiation of the procedure.

Initial ultrasound scanning demonstrates a large amount of ascites
within the right lower abdominal quadrant. The right lower abdomen
was prepped and draped in the usual sterile fashion. 1% lidocaine
was used for local anesthesia.

Following this, a 19 gauge, 7-cm, Yueh catheter was introduced. An
ultrasound image was saved for documentation purposes. The
paracentesis was performed. The catheter was removed and a dressing
was applied. The patient tolerated the procedure well without
immediate post procedural complication.
FINDINGS: A total of approximately 5 liters of yellow fluid was removed.
IMPRESSION: Successful ultrasound-guided therapeutic paracentesis yielding 5
liters of peritoneal fluid.
